# Patient Record
Sex: Male | Born: 1950 | Race: Black or African American | Hispanic: No | Smoking: Current some day smoker
Health system: Southern US, Community
[De-identification: ages and names within clinical notes are randomized; demographics above are authoritative.]

## PROBLEM LIST (undated history)

## (undated) DIAGNOSIS — C7800 Secondary malignant neoplasm of unspecified lung: Secondary | ICD-10-CM

## (undated) DIAGNOSIS — F419 Anxiety disorder, unspecified: Secondary | ICD-10-CM

## (undated) DIAGNOSIS — E119 Type 2 diabetes mellitus without complications: Secondary | ICD-10-CM

## (undated) DIAGNOSIS — T7840XA Allergy, unspecified, initial encounter: Secondary | ICD-10-CM

## (undated) DIAGNOSIS — I10 Essential (primary) hypertension: Secondary | ICD-10-CM

## (undated) HISTORY — PX: OTHER SURGICAL HISTORY: SHX169

## (undated) HISTORY — DX: Essential (primary) hypertension: I10

## (undated) HISTORY — DX: Type 2 diabetes mellitus without complications: E11.9

## (undated) HISTORY — DX: Allergy, unspecified, initial encounter: T78.40XA

## (undated) HISTORY — DX: Anxiety disorder, unspecified: F41.9

---

## 1998-04-03 ENCOUNTER — Encounter: Admission: RE | Admit: 1998-04-03 | Discharge: 1998-07-02 | Payer: Self-pay | Admitting: Internal Medicine

## 1998-07-19 ENCOUNTER — Ambulatory Visit (HOSPITAL_COMMUNITY): Admission: RE | Admit: 1998-07-19 | Discharge: 1998-07-19 | Payer: Self-pay | Admitting: Internal Medicine

## 2004-04-21 ENCOUNTER — Emergency Department (HOSPITAL_COMMUNITY): Admission: EM | Admit: 2004-04-21 | Discharge: 2004-04-21 | Payer: Self-pay | Admitting: Emergency Medicine

## 2004-04-29 ENCOUNTER — Emergency Department (HOSPITAL_COMMUNITY): Admission: EM | Admit: 2004-04-29 | Discharge: 2004-04-29 | Payer: Self-pay | Admitting: Emergency Medicine

## 2006-08-01 ENCOUNTER — Emergency Department (HOSPITAL_COMMUNITY): Admission: EM | Admit: 2006-08-01 | Discharge: 2006-08-01 | Payer: Self-pay | Admitting: Emergency Medicine

## 2007-07-06 ENCOUNTER — Ambulatory Visit: Payer: Self-pay | Admitting: Family Medicine

## 2007-07-06 ENCOUNTER — Ambulatory Visit: Payer: Self-pay | Admitting: *Deleted

## 2007-09-07 ENCOUNTER — Ambulatory Visit: Payer: Self-pay | Admitting: Family Medicine

## 2007-11-29 ENCOUNTER — Ambulatory Visit: Payer: Self-pay | Admitting: Family Medicine

## 2008-03-07 ENCOUNTER — Ambulatory Visit: Payer: Self-pay | Admitting: Family Medicine

## 2010-08-14 ENCOUNTER — Emergency Department (HOSPITAL_COMMUNITY): Admission: EM | Admit: 2010-08-14 | Discharge: 2010-08-14 | Payer: Self-pay | Admitting: Family Medicine

## 2011-01-30 LAB — POCT I-STAT, CHEM 8
Calcium, Ion: 1.19 mmol/L (ref 1.12–1.32)
Creatinine, Ser: 0.6 mg/dL (ref 0.4–1.5)
Glucose, Bld: 243 mg/dL — ABNORMAL HIGH (ref 70–99)
HCT: 45 % (ref 39.0–52.0)
Potassium: 3.9 mEq/L (ref 3.5–5.1)
Sodium: 137 mEq/L (ref 135–145)

## 2012-12-18 ENCOUNTER — Ambulatory Visit: Payer: Self-pay | Admitting: Family Medicine

## 2012-12-18 VITALS — BP 184/99 | HR 84 | Temp 97.6°F | Resp 18 | Ht 72.5 in | Wt 177.6 lb

## 2012-12-18 DIAGNOSIS — Z0289 Encounter for other administrative examinations: Secondary | ICD-10-CM

## 2012-12-18 DIAGNOSIS — I10 Essential (primary) hypertension: Secondary | ICD-10-CM

## 2012-12-18 DIAGNOSIS — E119 Type 2 diabetes mellitus without complications: Secondary | ICD-10-CM

## 2012-12-18 LAB — POCT URINALYSIS DIPSTICK
Glucose, UA: >=1000
Ketones, UA: NEGATIVE
Nitrite, UA: NEGATIVE
Spec Grav, UA: 1.02
pH, UA: 5.5

## 2012-12-18 LAB — POCT CBC
Lymph, poc: 2.9 (ref 0.6–3.4)
MCH, POC: 29.5 pg (ref 27–31.2)
MPV: 7.4 fL (ref 0–99.8)
POC LYMPH PERCENT: 39.2 %L (ref 10–50)
Platelet Count, POC: 206 10*3/uL (ref 142–424)
RBC: 4.85 M/uL (ref 4.69–6.13)
RDW, POC: 15.1 %

## 2012-12-18 LAB — POCT UA - MICROSCOPIC ONLY
Bacteria, U Microscopic: NEGATIVE
Casts, Ur, LPF, POC: NEGATIVE
Crystals, Ur, HPF, POC: NEGATIVE
Epithelial cells, urine per micros: NEGATIVE
WBC, Ur, HPF, POC: NEGATIVE

## 2012-12-18 MED ORDER — GLIPIZIDE 5 MG PO TABS: 5.0000 mg | ORAL_TABLET | Freq: Two times a day (BID) | ORAL | Status: DC

## 2012-12-18 MED ORDER — METFORMIN HCL 500 MG PO TABS: 500.0000 mg | ORAL_TABLET | Freq: Two times a day (BID) | ORAL | Status: DC

## 2012-12-18 MED ORDER — LISINOPRIL-HYDROCHLOROTHIAZIDE 10-12.5 MG PO TABS: 1.0000 | ORAL_TABLET | Freq: Every day | ORAL | Status: DC

## 2012-12-18 NOTE — Progress Notes (Signed)
[address]   [place], [place]  [address]   [phone]  Subjective:    Patient ID: Ralph [name], male    DOB: 11-02-1951, 62 y.o.   MRN: [id number]  HPIThis 62 y.o. male presents for evaluation for DOT CPE self pay.  Drives for [employer]; drives all over country. Last DOT CPE two years ago at [facility] 01/06/2011 by [place].   Has corrective lens but does not wear or need to drive.  PCP: none  Anxiety: takes Lexapro as needed; takes Klonopin tid but usually takes 1-3 times per day. No SI/HI.  No behavioral health admissions; no SI.  DMII: noted in paper chart and on DOT from 2006.  Previously maintained on Metformin and Amaryl. Pt does not recall taking these medications in past but does admit to having a glucometer at home and he reports that he knows how to check blood sugar.  Weight in 2006 of 216; has lost significant weight since then; went on a liquid diet to lose weight. Multiple family members with DMII.  Denies polyuria,polydipsia, numbness in extremities.  HTN:  Noted in paper chart and on DOT from 2006; previously maintained on Lotrel.  No medications in several years; lost significant weight since 2006.  Denies HA, dizziness, focal weakness, paresthesias.  Denies CP/palp/SOB/leg swelling.   Review of Systems  Constitutional: Negative for fever, chills, diaphoresis and fatigue.  HENT: Negative for hearing loss, ear pain, sore throat and tinnitus.   Eyes: Negative for photophobia and visual disturbance.  Respiratory: Negative for apnea, cough and shortness of breath.   Cardiovascular: Negative for chest pain, palpitations and leg swelling.  Gastrointestinal: Negative for nausea, vomiting, abdominal pain, diarrhea, constipation, blood in stool, abdominal distention and rectal pain.  Genitourinary: Negative for dysuria, urgency, hematuria, discharge, penile swelling, scrotal swelling, penile pain and testicular pain.  Musculoskeletal: Negative for myalgias, back pain, joint  swelling, arthralgias and gait problem.  Skin: Negative for color change, pallor, rash and wound.  Neurological: Negative for dizziness, tremors, seizures, syncope, facial asymmetry, speech difficulty, weakness, light-headedness, numbness and headaches.  Hematological: Negative for adenopathy. Does not bruise/bleed easily.  Psychiatric/Behavioral: Negative for suicidal ideas, behavioral problems, sleep disturbance, self-injury, dysphoric mood and agitation. The patient is nervous/anxious.     Past Medical History  Diagnosis Date  . Allergy   . Anxiety     followed [facility] every six months.  . Hypertension   . Diabetes mellitus without complication     Past Surgical History  Procedure Date  . Rotator cuff surgery     Right    Prior to Admission medications   Medication Sig Start Date End Date Taking? Authorizing Provider  clonazePAM (KLONOPIN) 0.5 MG tablet Take 0.5 mg by mouth 3 (three) times daily as needed.   Yes Historical Provider, MD  escitalopram (LEXAPRO) 10 MG tablet Take 10 mg by mouth daily.   Yes Historical Provider, MD  glipiZIDE (GLUCOTROL) 5 MG tablet Take 1 tablet (5 mg total) by mouth 2 (two) times daily before a meal. 12/18/12   Ethelda Chick, MD  lisinopril-hydrochlorothiazide (PRINZIDE,ZESTORETIC) 10-12.5 MG per tablet Take 1 tablet by mouth daily. 12/18/12   Ethelda Chick, MD  metFORMIN (GLUCOPHAGE) 500 MG tablet Take 1 tablet (500 mg total) by mouth 2 (two) times daily with a meal. 12/18/12   Ethelda Chick, MD    No Known Allergies  History   Social History  . Marital Status: Divorced    Spouse Name: N/A  Number of Children: N/A  . Years of Education: N/A   Occupational History  . Not on file.   Social History Main Topics  . Smoking status: Current Every Day Smoker -- 1.0 packs/day for 15 years    Types: Cigarettes  . Smokeless tobacco: Not on file  . Alcohol Use: Not on file  . Drug Use: Not on file  . Sexually Active: Not on file    Other Topics Concern  . Not on file   Social History Narrative   Marital status: divorced; dating casually   Children: 2 grandchildren   Employment: truck driver x 15 years   Tobacco: 1 ppd   Alcohol:  Weekends   Drugs: none    Family History  Problem Relation Age of Onset  . Heart disease Mother     AMI  . Cancer Sister     breast  . Heart disease Brother     AMI  . Cancer Brother        Objective:   Physical Exam  Nursing note and vitals reviewed. Constitutional: He is oriented to person, place, and time. He appears well-developed and well-nourished. No distress.  HENT:  Head: Normocephalic and atraumatic.  Right Ear: External ear normal.  Left Ear: External ear normal.  Nose: Nose normal.  Mouth/Throat: Oropharynx is clear and moist. Dental caries present.  Eyes: Conjunctivae normal and EOM are normal. Pupils are equal, round, and reactive to light.  Neck: Normal range of motion. Neck supple. No thyromegaly present.  Cardiovascular: Normal rate, regular rhythm, normal heart sounds and intact distal pulses.  Exam reveals no gallop and no friction rub.   No murmur heard. Pulmonary/Chest: Effort normal and breath sounds normal. He has no wheezes. He has no rales.  Abdominal: Soft. Bowel sounds are normal. He exhibits no distension and no mass. There is no tenderness. There is no rebound and no guarding. Hernia confirmed negative in the right inguinal area and confirmed negative in the left inguinal area.  Genitourinary: Testes normal and penis normal. Right testis shows no mass, no swelling and no tenderness. Left testis shows no mass, no swelling and no tenderness. No penile tenderness.  Musculoskeletal:       Right shoulder: Normal.       Left shoulder: Normal.       Right knee: Normal.       Left knee: Normal.       Cervical back: Normal.       Thoracic back: Normal.       Lumbar back: Normal.  Lymphadenopathy:    He has no cervical adenopathy.       Right: No  inguinal adenopathy present.       Left: No inguinal adenopathy present.  Neurological: He is alert and oriented to person, place, and time. He has normal reflexes. No cranial nerve deficit. He exhibits normal muscle tone. Coordination normal.  Skin: Skin is warm and dry. No rash noted. He is not diaphoretic. No erythema. No pallor.  Psychiatric: He has a normal mood and affect. His behavior is normal. Judgment and thought content normal.       Results for orders placed in visit on 12/18/12  GLUCOSE, POCT (MANUAL RESULT ENTRY)      Component Value Range   POC Glucose 248 (*) 70 - 99 mg/dl  POCT GLYCOSYLATED HEMOGLOBIN (HGB A1C)      Component Value Range   Hemoglobin A1C 12.6    POCT UA - MICROSCOPIC ONLY  Component Value Range   WBC, Ur, HPF, POC neg     RBC, urine, microscopic 1-2     Bacteria, U Microscopic neg     Mucus, UA neg     Epithelial cells, urine per micros neg     Crystals, Ur, HPF, POC neg     Casts, Ur, LPF, POC neg     Yeast, UA neg    POCT URINALYSIS DIPSTICK      Component Value Range   Color, UA yellow     Clarity, UA clear     Glucose, UA >=1000     Bilirubin, UA neg     Ketones, UA neg     Spec Grav, UA 1.020     Blood, UA moderate     pH, UA 5.5     Protein, UA 100     Urobilinogen, UA 0.2     Nitrite, UA neg     Leukocytes, UA Negative       Assessment & Plan:   1. Health examination of defined subpopulation  POCT glucose (manual entry), POCT glycosylated hemoglobin (Hb A1C), POCT UA - Microscopic Only, POCT urinalysis dipstick, POCT CBC, Comprehensive metabolic panel  2. Diabetes  POCT CBC, Comprehensive metabolic panel, metFORMIN (GLUCOPHAGE) 500 MG tablet, glipiZIDE (GLUCOTROL) 5 MG tablet  3. Essential hypertension, benign  lisinopril-hydrochlorothiazide (PRINZIDE,ZESTORETIC) 10-12.5 MG per tablet     1.DOT CPE:  Three month card only provided due to HTN, DMII uncontrolled.   2. DMII: uncontrolled; rx for Metformin 500mg  bid,  Glucotrol 5mg  bid provided.  Will warrant increase of Metformin to 1000mg  bid at follow-up in one month. 3.  HTN: uncontrolled; rx for Lisinopril/HCTZ 10/12.5 provided to take once daily.  Follow-up in one month for repeat BP. 4. Anxiety:  Stable; managed by psychiatry locally.  No SI/HI.    Meds ordered this encounter  Medications  . escitalopram (LEXAPRO) 10 MG tablet    Sig: Take 10 mg by mouth daily.  . clonazePAM (KLONOPIN) 0.5 MG tablet    Sig: Take 0.5 mg by mouth 3 (three) times daily as needed.  . metFORMIN (GLUCOPHAGE) 500 MG tablet    Sig: Take 1 tablet (500 mg total) by mouth 2 (two) times daily with a meal.    Dispense:  60 tablet    Refill:  3  . lisinopril-hydrochlorothiazide (PRINZIDE,ZESTORETIC) 10-12.5 MG per tablet    Sig: Take 1 tablet by mouth daily.    Dispense:  30 tablet    Refill:  3  . glipiZIDE (GLUCOTROL) 5 MG tablet    Sig: Take 1 tablet (5 mg total) by mouth 2 (two) times daily before a meal.    Dispense:  60 tablet    Refill:  3

## 2012-12-18 NOTE — Patient Instructions (Addendum)
1. Health examination of defined subpopulation  POCT glucose (manual entry), POCT glycosylated hemoglobin (Hb A1C), POCT UA - Microscopic Only, POCT urinalysis dipstick, POCT CBC, Comprehensive metabolic panel  2. Diabetes  POCT CBC, Comprehensive metabolic panel, metFORMIN (GLUCOPHAGE) 500 MG tablet, glipiZIDE (GLUCOTROL) 5 MG tablet  3. Essential hypertension, benign  lisinopril-hydrochlorothiazide (PRINZIDE,ZESTORETIC) 10-12.5 MG per tablet     RETURN IN ONE MONTH FOR BLOOD PRESSURE AND BLOOD SUGAR RECHECK. CHECK SUGAR ONCE DAILY; KEEP METER IN TRUCK.

## 2012-12-19 ENCOUNTER — Encounter: Payer: Self-pay | Admitting: Family Medicine

## 2012-12-19 LAB — COMPREHENSIVE METABOLIC PANEL
ALT: <8 U/L (ref 0–53)
AST: 11 U/L (ref 0–37)
Albumin: 3.7 g/dL (ref 3.5–5.2)
BUN: 14 mg/dL (ref 6–23)
Calcium: 9.3 mg/dL (ref 8.4–10.5)
Chloride: 104 mEq/L (ref 96–112)
Creat: 1.27 mg/dL (ref 0.50–1.35)
Total Protein: 6.1 g/dL (ref 6.0–8.3)

## 2012-12-21 ENCOUNTER — Encounter: Payer: Self-pay | Admitting: Family Medicine

## 2013-03-20 ENCOUNTER — Ambulatory Visit: Payer: Self-pay | Admitting: Emergency Medicine

## 2013-03-20 ENCOUNTER — Ambulatory Visit: Payer: Self-pay

## 2013-03-20 VITALS — BP 130/68 | HR 77 | Temp 98.8°F | Resp 16 | Ht 74.0 in | Wt 175.0 lb

## 2013-03-20 DIAGNOSIS — I10 Essential (primary) hypertension: Secondary | ICD-10-CM | POA: Insufficient documentation

## 2013-03-20 DIAGNOSIS — M549 Dorsalgia, unspecified: Secondary | ICD-10-CM

## 2013-03-20 DIAGNOSIS — M25552 Pain in left hip: Secondary | ICD-10-CM

## 2013-03-20 DIAGNOSIS — M25559 Pain in unspecified hip: Secondary | ICD-10-CM

## 2013-03-20 DIAGNOSIS — E119 Type 2 diabetes mellitus without complications: Secondary | ICD-10-CM | POA: Insufficient documentation

## 2013-03-20 MED ORDER — MELOXICAM 7.5 MG PO TABS: 7.5000 mg | ORAL_TABLET | Freq: Every day | ORAL | Status: DC

## 2013-03-20 MED ORDER — HYDROCODONE-ACETAMINOPHEN 5-325 MG PO TABS: ORAL_TABLET | ORAL | Status: DC
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
## 2013-03-20 NOTE — Patient Instructions (Addendum)

## 2013-04-13 ENCOUNTER — Telehealth: Payer: Self-pay

## 2013-04-13 NOTE — Telephone Encounter (Signed)
Patient would like to be referred to a specialist for pain in his legs. [phone]

## 2013-04-13 NOTE — Telephone Encounter (Signed)
Called him, has he notified his W/C carrier? He states he has not but he will do this.

## 2013-07-23 ENCOUNTER — Other Ambulatory Visit: Payer: Self-pay | Admitting: Family Medicine

## 2013-09-24 ENCOUNTER — Other Ambulatory Visit: Payer: Self-pay | Admitting: Family Medicine

## 2014-09-01 DIAGNOSIS — Z0271 Encounter for disability determination: Secondary | ICD-10-CM

## 2014-09-10 ENCOUNTER — Inpatient Hospital Stay (HOSPITAL_COMMUNITY)
Admission: EM | Admit: 2014-09-10 | Discharge: 2014-09-21 | DRG: 987 | Disposition: A | Discharge status: Skilled Nursing Facility | Payer: Medicaid Other | Attending: Internal Medicine | Admitting: Internal Medicine

## 2014-09-10 ENCOUNTER — Encounter (HOSPITAL_COMMUNITY): Payer: Self-pay | Admitting: Emergency Medicine

## 2014-09-10 ENCOUNTER — Emergency Department (HOSPITAL_COMMUNITY): Payer: Medicaid Other

## 2014-09-10 DIAGNOSIS — D638 Anemia in other chronic diseases classified elsewhere: Secondary | ICD-10-CM | POA: Diagnosis present

## 2014-09-10 DIAGNOSIS — T8089XA Other complications following infusion, transfusion and therapeutic injection, initial encounter: Secondary | ICD-10-CM

## 2014-09-10 DIAGNOSIS — Z6824 Body mass index (BMI) 24.0-24.9, adult: Secondary | ICD-10-CM | POA: Diagnosis not present

## 2014-09-10 DIAGNOSIS — F329 Major depressive disorder, single episode, unspecified: Secondary | ICD-10-CM | POA: Diagnosis present

## 2014-09-10 DIAGNOSIS — N183 Chronic kidney disease, stage 3 (moderate): Secondary | ICD-10-CM | POA: Diagnosis present

## 2014-09-10 DIAGNOSIS — R627 Adult failure to thrive: Secondary | ICD-10-CM | POA: Diagnosis present

## 2014-09-10 DIAGNOSIS — I5021 Acute systolic (congestive) heart failure: Secondary | ICD-10-CM

## 2014-09-10 DIAGNOSIS — J910 Malignant pleural effusion: Secondary | ICD-10-CM | POA: Diagnosis present

## 2014-09-10 DIAGNOSIS — Z8249 Family history of ischemic heart disease and other diseases of the circulatory system: Secondary | ICD-10-CM

## 2014-09-10 DIAGNOSIS — R591 Generalized enlarged lymph nodes: Secondary | ICD-10-CM

## 2014-09-10 DIAGNOSIS — E43 Unspecified severe protein-calorie malnutrition: Secondary | ICD-10-CM | POA: Insufficient documentation

## 2014-09-10 DIAGNOSIS — Y95 Nosocomial condition: Secondary | ICD-10-CM | POA: Diagnosis not present

## 2014-09-10 DIAGNOSIS — C770 Secondary and unspecified malignant neoplasm of lymph nodes of head, face and neck: Secondary | ICD-10-CM | POA: Diagnosis present

## 2014-09-10 DIAGNOSIS — I129 Hypertensive chronic kidney disease with stage 1 through stage 4 chronic kidney disease, or unspecified chronic kidney disease: Secondary | ICD-10-CM | POA: Diagnosis present

## 2014-09-10 DIAGNOSIS — E119 Type 2 diabetes mellitus without complications: Secondary | ICD-10-CM

## 2014-09-10 DIAGNOSIS — J9692 Respiratory failure, unspecified with hypercapnia: Secondary | ICD-10-CM | POA: Diagnosis present

## 2014-09-10 DIAGNOSIS — C3490 Malignant neoplasm of unspecified part of unspecified bronchus or lung: Secondary | ICD-10-CM | POA: Diagnosis present

## 2014-09-10 DIAGNOSIS — Z9889 Other specified postprocedural states: Secondary | ICD-10-CM

## 2014-09-10 DIAGNOSIS — F17210 Nicotine dependence, cigarettes, uncomplicated: Secondary | ICD-10-CM | POA: Diagnosis present

## 2014-09-10 DIAGNOSIS — R0902 Hypoxemia: Secondary | ICD-10-CM

## 2014-09-10 DIAGNOSIS — J189 Pneumonia, unspecified organism: Secondary | ICD-10-CM | POA: Diagnosis not present

## 2014-09-10 DIAGNOSIS — N189 Chronic kidney disease, unspecified: Secondary | ICD-10-CM | POA: Insufficient documentation

## 2014-09-10 DIAGNOSIS — Z79899 Other long term (current) drug therapy: Secondary | ICD-10-CM

## 2014-09-10 DIAGNOSIS — N179 Acute kidney failure, unspecified: Secondary | ICD-10-CM | POA: Diagnosis present

## 2014-09-10 DIAGNOSIS — I472 Ventricular tachycardia: Secondary | ICD-10-CM | POA: Diagnosis present

## 2014-09-10 DIAGNOSIS — F418 Other specified anxiety disorders: Secondary | ICD-10-CM | POA: Diagnosis present

## 2014-09-10 DIAGNOSIS — T380X5A Adverse effect of glucocorticoids and synthetic analogues, initial encounter: Secondary | ICD-10-CM | POA: Diagnosis present

## 2014-09-10 DIAGNOSIS — R0600 Dyspnea, unspecified: Secondary | ICD-10-CM

## 2014-09-10 DIAGNOSIS — I10 Essential (primary) hypertension: Secondary | ICD-10-CM

## 2014-09-10 DIAGNOSIS — J9601 Acute respiratory failure with hypoxia: Secondary | ICD-10-CM | POA: Diagnosis present

## 2014-09-10 DIAGNOSIS — R0602 Shortness of breath: Secondary | ICD-10-CM

## 2014-09-10 DIAGNOSIS — J90 Pleural effusion, not elsewhere classified: Secondary | ICD-10-CM | POA: Diagnosis present

## 2014-09-10 DIAGNOSIS — I771 Stricture of artery: Secondary | ICD-10-CM | POA: Diagnosis present

## 2014-09-10 DIAGNOSIS — C7800 Secondary malignant neoplasm of unspecified lung: Secondary | ICD-10-CM

## 2014-09-10 DIAGNOSIS — D649 Anemia, unspecified: Secondary | ICD-10-CM | POA: Diagnosis present

## 2014-09-10 DIAGNOSIS — Z66 Do not resuscitate: Secondary | ICD-10-CM | POA: Diagnosis present

## 2014-09-10 DIAGNOSIS — F419 Anxiety disorder, unspecified: Secondary | ICD-10-CM | POA: Diagnosis present

## 2014-09-10 DIAGNOSIS — J9691 Respiratory failure, unspecified with hypoxia: Secondary | ICD-10-CM | POA: Diagnosis present

## 2014-09-10 DIAGNOSIS — J9611 Chronic respiratory failure with hypoxia: Secondary | ICD-10-CM

## 2014-09-10 HISTORY — DX: Secondary malignant neoplasm of unspecified lung: C78.00

## 2014-09-10 LAB — COMPREHENSIVE METABOLIC PANEL
ALBUMIN: 2.8 g/dL — AB (ref 3.5–5.2)
ALK PHOS: 103 U/L (ref 39–117)
ALT: 12 U/L (ref 0–53)
ALT: 12 U/L (ref 0–53)
AST: 22 U/L (ref 0–37)
AST: 19 U/L (ref 0–37)
Albumin: 2.8 g/dL — ABNORMAL LOW (ref 3.5–5.2)
Alkaline Phosphatase: 102 U/L (ref 39–117)
Anion gap: 13 (ref 5–15)
Anion gap: 11 (ref 5–15)
BUN: 23 mg/dL (ref 6–23)
BUN: 22 mg/dL (ref 6–23)
CALCIUM: 8.1 mg/dL — AB (ref 8.4–10.5)
CO2: 27 meq/L (ref 19–32)
CO2: 27 meq/L (ref 19–32)
CREATININE: 1.49 mg/dL — AB (ref 0.50–1.35)
Calcium: 8.1 mg/dL — ABNORMAL LOW (ref 8.4–10.5)
Chloride: 103 mEq/L (ref 96–112)
Chloride: 101 mEq/L (ref 96–112)
Creatinine, Ser: 1.44 mg/dL — ABNORMAL HIGH (ref 0.50–1.35)
GFR calc Af Amer: 56 mL/min — ABNORMAL LOW (ref >90)
GFR calc non Af Amer: 49 mL/min — ABNORMAL LOW (ref >90)
GFR calc non Af Amer: 51 mL/min — ABNORMAL LOW (ref >90)
GFR, EST AFRICAN AMERICAN: 59 mL/min — AB (ref >90)
GLUCOSE: 231 mg/dL — AB (ref 70–99)
Glucose, Bld: 248 mg/dL — ABNORMAL HIGH (ref 70–99)
Potassium: 3.8 mEq/L (ref 3.7–5.3)
Potassium: 3.6 mEq/L — ABNORMAL LOW (ref 3.7–5.3)
SODIUM: 143 meq/L (ref 137–147)
SODIUM: 139 meq/L (ref 137–147)
TOTAL PROTEIN: 6 g/dL (ref 6.0–8.3)
TOTAL PROTEIN: 5.9 g/dL — AB (ref 6.0–8.3)
Total Bilirubin: 0.3 mg/dL (ref 0.3–1.2)
Total Bilirubin: 0.3 mg/dL (ref 0.3–1.2)

## 2014-09-10 LAB — CBC WITH DIFFERENTIAL/PLATELET
BASOS ABS: 0 10*3/uL (ref 0.0–0.1)
BASOS ABS: 0 10*3/uL (ref 0.0–0.1)
BASOS PCT: 0 % (ref 0–1)
Basophils Relative: 0 % (ref 0–1)
Eosinophils Absolute: 0.2 10*3/uL (ref 0.0–0.7)
Eosinophils Absolute: 0.3 10*3/uL (ref 0.0–0.7)
Eosinophils Relative: 2 % (ref 0–5)
Eosinophils Relative: 3 % (ref 0–5)
HEMATOCRIT: 31.7 % — AB (ref 39.0–52.0)
HEMATOCRIT: 30.9 % — AB (ref 39.0–52.0)
Hemoglobin: 10.9 g/dL — ABNORMAL LOW (ref 13.0–17.0)
Hemoglobin: 10.5 g/dL — ABNORMAL LOW (ref 13.0–17.0)
LYMPHS ABS: 2 10*3/uL (ref 0.7–4.0)
Lymphocytes Relative: 20 % (ref 12–46)
Lymphocytes Relative: 23 % (ref 12–46)
Lymphs Abs: 2.1 10*3/uL (ref 0.7–4.0)
MCH: 28.3 pg (ref 26.0–34.0)
MCH: 27.9 pg (ref 26.0–34.0)
MCHC: 34.4 g/dL (ref 30.0–36.0)
MCHC: 34 g/dL (ref 30.0–36.0)
MCV: 82.3 fL (ref 78.0–100.0)
MCV: 82.2 fL (ref 78.0–100.0)
MONO ABS: 0.4 10*3/uL (ref 0.1–1.0)
MONO ABS: 0.5 10*3/uL (ref 0.1–1.0)
MONOS PCT: 4 % (ref 3–12)
Monocytes Relative: 6 % (ref 3–12)
NEUTROS ABS: 7.5 10*3/uL (ref 1.7–7.7)
NEUTROS ABS: 6.4 10*3/uL (ref 1.7–7.7)
Neutrophils Relative %: 74 % (ref 43–77)
Neutrophils Relative %: 68 % (ref 43–77)
PLATELETS: 175 10*3/uL (ref 150–400)
Platelets: 170 10*3/uL (ref 150–400)
RBC: 3.85 MIL/uL — ABNORMAL LOW (ref 4.22–5.81)
RBC: 3.76 MIL/uL — ABNORMAL LOW (ref 4.22–5.81)
RDW: 17 % — AB (ref 11.5–15.5)
RDW: 17 % — AB (ref 11.5–15.5)
WBC: 10.2 10*3/uL (ref 4.0–10.5)
WBC: 9.3 10*3/uL (ref 4.0–10.5)

## 2014-09-10 LAB — HEMOGLOBIN A1C
Hgb A1c MFr Bld: 7.4 % — ABNORMAL HIGH (ref <5.7)
Mean Plasma Glucose: 166 mg/dL — ABNORMAL HIGH (ref <117)

## 2014-09-10 LAB — MAGNESIUM: MAGNESIUM: 1.7 mg/dL (ref 1.5–2.5)

## 2014-09-10 LAB — PROTIME-INR
INR: 1.25 (ref 0.00–1.49)
Prothrombin Time: 15.9 seconds — ABNORMAL HIGH (ref 11.6–15.2)

## 2014-09-10 LAB — TSH: TSH: 2.1 u[IU]/mL (ref 0.350–4.500)

## 2014-09-10 LAB — APTT: APTT: 34 s (ref 24–37)

## 2014-09-10 LAB — GLUCOSE, CAPILLARY: GLUCOSE-CAPILLARY: 323 mg/dL — AB (ref 70–99)

## 2014-09-10 LAB — PHOSPHORUS: PHOSPHORUS: 3.6 mg/dL (ref 2.3–4.6)

## 2014-09-10 MED ORDER — GLIPIZIDE 5 MG PO TABS
5.0000 mg | ORAL_TABLET | Freq: Every day | ORAL | Status: DC
  Filled 2014-09-10 (×2): qty 1

## 2014-09-10 MED ORDER — ONDANSETRON HCL 4 MG/2ML IJ SOLN: 4.0000 mg | Freq: Three times a day (TID) | INTRAMUSCULAR | Status: DC | PRN

## 2014-09-10 MED ORDER — ESCITALOPRAM OXALATE 10 MG PO TABS
10.0000 mg | ORAL_TABLET | Freq: Every day | ORAL | Status: DC
  Administered 2014-09-11 – 2014-09-21 (×10): 10 mg via ORAL
  Filled 2014-09-10 (×11): qty 1

## 2014-09-10 MED ORDER — ACETAMINOPHEN 325 MG PO TABS
650.0000 mg | ORAL_TABLET | Freq: Four times a day (QID) | ORAL | Status: DC | PRN
  Administered 2014-09-12 – 2014-09-21 (×6): 650 mg via ORAL
  Filled 2014-09-10 (×6): qty 2

## 2014-09-10 MED ORDER — LEVALBUTEROL HCL 0.63 MG/3ML IN NEBU: 0.6300 mg | INHALATION_SOLUTION | RESPIRATORY_TRACT | Status: DC | PRN

## 2014-09-10 MED ORDER — HYDRALAZINE HCL 20 MG/ML IJ SOLN
5.0000 mg | Freq: Once | INTRAMUSCULAR | Status: AC
  Administered 2014-09-10: 5 mg via INTRAVENOUS
  Filled 2014-09-10: qty 1

## 2014-09-10 MED ORDER — IOHEXOL 350 MG/ML SOLN
100.0000 mL | Freq: Once | INTRAVENOUS | Status: AC | PRN
  Administered 2014-09-10: 100 mL via INTRAVENOUS

## 2014-09-10 MED ORDER — ACETAMINOPHEN 650 MG RE SUPP: 650.0000 mg | Freq: Four times a day (QID) | RECTAL | Status: DC | PRN

## 2014-09-10 MED ORDER — IPRATROPIUM BROMIDE 0.02 % IN SOLN: 0.5000 mg | RESPIRATORY_TRACT | Status: DC | PRN

## 2014-09-10 MED ORDER — CLONAZEPAM 0.5 MG PO TABS
0.5000 mg | ORAL_TABLET | Freq: Three times a day (TID) | ORAL | Status: DC | PRN
  Administered 2014-09-10 – 2014-09-21 (×11): 0.5 mg via ORAL
  Filled 2014-09-10 (×11): qty 1

## 2014-09-10 MED ORDER — HYDRALAZINE HCL 20 MG/ML IJ SOLN
10.0000 mg | Freq: Four times a day (QID) | INTRAMUSCULAR | Status: DC | PRN
  Administered 2014-09-11: 10 mg via INTRAVENOUS
  Filled 2014-09-10: qty 1

## 2014-09-10 MED ORDER — INSULIN ASPART 100 UNIT/ML ~~LOC~~ SOLN
4.0000 [IU] | Freq: Once | SUBCUTANEOUS | Status: AC
  Administered 2014-09-10: 4 [IU] via SUBCUTANEOUS

## 2014-09-10 MED ORDER — ONDANSETRON HCL 4 MG PO TABS: 4.0000 mg | ORAL_TABLET | Freq: Four times a day (QID) | ORAL | Status: DC | PRN

## 2014-09-10 MED ORDER — INSULIN ASPART 100 UNIT/ML ~~LOC~~ SOLN
0.0000 [IU] | Freq: Three times a day (TID) | SUBCUTANEOUS | Status: DC
  Administered 2014-09-11: 5 [IU] via SUBCUTANEOUS
  Administered 2014-09-11: 1 [IU] via SUBCUTANEOUS
  Administered 2014-09-12: 3 [IU] via SUBCUTANEOUS

## 2014-09-10 MED ORDER — ONDANSETRON HCL 4 MG/2ML IJ SOLN
4.0000 mg | Freq: Four times a day (QID) | INTRAMUSCULAR | Status: DC | PRN
  Administered 2014-09-12 – 2014-09-16 (×7): 4 mg via INTRAVENOUS
  Filled 2014-09-10 (×7): qty 2

## 2014-09-10 MED ORDER — SODIUM CHLORIDE 0.9 % IV SOLN
INTRAVENOUS | Status: DC
  Administered 2014-09-10 – 2014-09-15 (×2): via INTRAVENOUS

## 2014-09-10 NOTE — ED Notes (Signed)
Attempted to call report to floor, RN hanging lood and unable to take report at this time and will call back

## 2014-09-10 NOTE — ED Provider Notes (Signed)
CSN: [id number]     Arrival date & time 09/10/14  1028 History   First MD Initiated Contact with Patient 09/10/14 1156     Chief Complaint  Patient presents with  . Shortness of Breath     HPI Pt c/o SOB and increased burping x 2 days. Pt sts "I had a cold and it feels like it is getting worse. I just keep burping." Pt can not state anything that makes it better or worse. Pt speaking in full sentences. Denies asthma and COPD. Denies edema.Life long smoker   Past Medical History  Diagnosis Date  . Allergy   . Anxiety     followed [facility] every six months.  . Hypertension   . Diabetes mellitus without complication    Past Surgical History  Procedure Laterality Date  . Rotator cuff surgery      Right   Family History  Problem Relation Age of Onset  . Heart disease Mother     AMI  . Cancer Sister     breast  . Heart disease Brother     AMI  . Cancer Brother    History  Substance Use Topics  . Smoking status: Current Every Day Smoker -- 0.75 packs/day for 15 years    Types: Cigarettes  . Smokeless tobacco: Not on file  . Alcohol Use: No    Review of Systems    Allergies  Review of patient's allergies indicates no known allergies.  Home Medications   Prior to Admission medications   Medication Sig Start Date End Date Taking? Authorizing Provider  clonazePAM (KLONOPIN) 0.5 MG tablet Take 0.5 mg by mouth 3 (three) times daily as needed for anxiety.    Yes Historical Provider, MD  escitalopram (LEXAPRO) 10 MG tablet Take 10 mg by mouth daily.   Yes Historical Provider, MD  glipiZIDE (GLUCOTROL) 5 MG tablet Take 5 mg by mouth daily before breakfast.   Yes Historical Provider, MD  ibuprofen (ADVIL,MOTRIN) 200 MG tablet Take 600 mg by mouth every 6 (six) hours as needed for mild pain or moderate pain.   Yes Historical Provider, MD  metFORMIN (GLUCOPHAGE) 500 MG tablet Take 500 mg by mouth 2 (two) times daily with a meal.   Yes Historical Provider, MD   BP  160/97  Pulse 96  Temp(Src) 97.8 F (36.6 C) (Oral)  Resp 20  Ht 6\' 2"  (1.88 m)  Wt 180 lb (81.647 kg)  BMI 23.10 kg/m2  SpO2 100% Physical Exam Physical Exam  Nursing note and vitals reviewed. Constitutional: He is oriented to person, place, and time. He appears well-developed and well-nourished. No distress.  HENT:  Head: Normocephalic and atraumatic.  Eyes: Pupils are equal, round, and reactive to light.  Neck: Normal range of motion.  Cardiovascular: Normal rate and intact distal pulses.   Pulmonary/Chest: No respiratory distress.Decreased breath sounds RLL  Abdominal: Normal appearance. He exhibits no distension.  Musculoskeletal: Normal range of motion.  Neurological: He is alert and oriented to person, place, and time. No cranial nerve deficit.  Skin: Skin is warm and dry. No rash noted.  Psychiatric: He has a normal mood and affect. His behavior is normal.   ED Course  Procedures (including critical care time) Labs Review Labs Reviewed  CBC WITH DIFFERENTIAL - Abnormal; Notable for the following:    RBC 3.85 (*)    Hemoglobin 10.9 (*)    HCT 31.7 (*)    RDW 17.0 (*)    All other components within normal  limits  COMPREHENSIVE METABOLIC PANEL - Abnormal; Notable for the following:    Glucose, Bld 231 (*)    Creatinine, Ser 1.49 (*)    Calcium 8.1 (*)    Albumin 2.8 (*)    GFR calc non Af Amer 49 (*)    GFR calc Af Amer 56 (*)    All other components within normal limits  BODY FLUID CELL COUNT WITH DIFFERENTIAL - Abnormal; Notable for the following:    Color, Fluid YELLOW (*)    Appearance, Fluid HAZY (*)    Monocyte-Macrophage-Serous Fluid 7 (*)    All other components within normal limits  LACTATE DEHYDROGENASE, BODY FLUID - Abnormal; Notable for the following:    LD, Fluid 187 (*)    All other components within normal limits  COMPREHENSIVE METABOLIC PANEL - Abnormal; Notable for the following:    Potassium 3.6 (*)    Glucose, Bld 248 (*)    Creatinine,  Ser 1.44 (*)    Calcium 8.1 (*)    Total Protein 5.9 (*)    Albumin 2.8 (*)    GFR calc non Af Amer 51 (*)    GFR calc Af Amer 59 (*)    All other components within normal limits  CBC WITH DIFFERENTIAL - Abnormal; Notable for the following:    RBC 3.76 (*)    Hemoglobin 10.5 (*)    HCT 30.9 (*)    RDW 17.0 (*)    All other components within normal limits  PROTIME-INR - Abnormal; Notable for the following:    Prothrombin Time 15.9 (*)    All other components within normal limits  HEMOGLOBIN A1C - Abnormal; Notable for the following:    Hemoglobin A1C 7.4 (*)    Mean Plasma Glucose 166 (*)    All other components within normal limits  CBC - Abnormal; Notable for the following:    RBC 3.45 (*)    Hemoglobin 10.0 (*)    HCT 28.8 (*)    RDW 17.2 (*)    All other components within normal limits  COMPREHENSIVE METABOLIC PANEL - Abnormal; Notable for the following:    Potassium 3.3 (*)    Glucose, Bld 214 (*)    Creatinine, Ser 1.44 (*)    Calcium 7.9 (*)    Total Protein 5.5 (*)    Albumin 2.6 (*)    GFR calc non Af Amer 51 (*)    GFR calc Af Amer 59 (*)    All other components within normal limits  GLUCOSE, CAPILLARY - Abnormal; Notable for the following:    Glucose-Capillary 323 (*)    All other components within normal limits  LACTATE DEHYDROGENASE - Abnormal; Notable for the following:    LDH 449 (*)    All other components within normal limits  GLUCOSE, CAPILLARY - Abnormal; Notable for the following:    Glucose-Capillary 258 (*)    All other components within normal limits  FERRITIN - Abnormal; Notable for the following:    Ferritin 492 (*)    All other components within normal limits  TRANSFERRIN - Abnormal; Notable for the following:    Transferrin 202 (*)    All other components within normal limits  GLUCOSE, CAPILLARY - Abnormal; Notable for the following:    Glucose-Capillary 146 (*)    All other components within normal limits  BODY FLUID CULTURE  ALBUMIN,  FLUID  PROTEIN, BODY FLUID  MAGNESIUM  PHOSPHORUS  APTT  TSH  HIV ANTIBODY (ROUTINE TESTING)  URIC  ACID  HEPATITIS B SURFACE ANTIBODY  HEPATITIS B SURFACE ANTIGEN  HEPATITIS B CORE ANTIBODY, IGM  HEPATITIS C ANTIBODY  SEDIMENTATION RATE  IRON AND TIBC  VITAMIN B12  TSH  OCCULT BLOOD X 1 CARD TO LAB, STOOL  GLUCOSE, CAPILLARY  QUANTIFERON TB GOLD ASSAY  BETA 2 MICROGLOBULIN, SERUM  FOLATE RBC  URINALYSIS, ROUTINE W REFLEX MICROSCOPIC  BASIC METABOLIC PANEL  CBC  TROPONIN I  TROPONIN I  TROPONIN I  CYTOLOGY - NON PAP    Imaging Review Dg Chest 1 View  09/11/2014   CLINICAL DATA:  Post right thoracentesis  EXAM: CHEST - 1 VIEW  COMPARISON:  09/10/2014  FINDINGS: Decreased right pleural effusion following thoracentesis. Small right effusion remains. No pneumothorax. Right lower lobe atelectasis has improved  Small left effusion and left lower lobe atelectasis unchanged. Negative for heart failure. Right peritracheal adenopathy noted  IMPRESSION: Decreased right pleural effusion following thoracentesis. No pneumothorax.   Electronically Signed   By: Franchot Gallo M.D.   On: 09/11/2014 11:01   Dg Chest 2 View (if Patient Has Fever And/or Copd)  09/10/2014   CLINICAL DATA:  Short of breath and burping over the last 2 days. History of hypertension and diabetes. History of smoking.  EXAM: CHEST  2 VIEW  COMPARISON:  03/14/2014.  FINDINGS: There are new moderate right and small left pleural effusions. There is additional lung base opacity likely atelectasis. Infiltrate not excluded. No evidence of pulmonary edema. No pneumothorax.  Cardiac silhouette is normal in size. No mediastinal or hilar masses.  Bony thorax is intact.  IMPRESSION: Moderate right and small left effusions, new from the prior chest radiograph. Associated right greater than left lung base opacity is likely atelectasis. Consider pneumonia in the proper clinical setting. No pulmonary edema.   Electronically Signed   By:  Lajean Manes M.D.   On: 09/10/2014 11:52   Ct Angio Chest Pe W/cm &/or Wo Cm  09/10/2014   CLINICAL DATA:  Shortness of breath, increased burping for 2 days, had a cold that feels like it is getting worse, history hypertension, diabetes mellitus, anxiety  EXAM: CT ANGIOGRAPHY CHEST WITH CONTRAST  TECHNIQUE: Multidetector CT imaging of the chest was performed using the standard protocol during bolus administration of intravenous contrast. Multiplanar CT image reconstructions and MIPs were obtained to evaluate the vascular anatomy.  CONTRAST:  154mL OMNIPAQUE IOHEXOL 350 MG/ML SOLN IV  COMPARISON:  None  FINDINGS: Scattered atherosclerotic calcifications aorta and coronary arteries.  Aorta normal caliber.  Asymmetric gynecomastia slightly greater on LEFT.  Pulmonary arteries patent.  No evidence of pulmonary embolism.  BILATERAL pleural effusions moderate LEFT and large RIGHT.  Minimal pericardial effusion.  Extensive adenopathy including:  RIGHT peritracheal node 26 mm short axis image 38  Precarinal nodes 20 mm and 15 mm image 45.  Subcarinal node 26 mm image 54.  LEFT hilar 14 mm image 56.  RIGHT supraclavicular 21 mm image 6  Retrocrural 21 mm image 90  Gastrohepatic ligament 37 mm image 103  Additional scattered upper abdominal, mediastinal and inferior cervical lymph nodes bilaterally.  Questionable focal nodule in LEFT lower lobe 19 mm image 70.  Significant compressive atelectasis of RIGHT greater than LEFT lungs.  Emphysematous changes at apices.  Narrowing of LEFT upper and LEFT lower lobe bronchi by hilar adenopathy.  No additional mass, nodule or infiltrate.  Minimal displacement of trachea to the LEFT by paratracheal adenopathy.  Slightly heterogeneous density of the thoracic vertebrae without focal lesion.  Review of the MIP images confirms the above findings.  IMPRESSION: No evidence of pulmonary embolism.  Extensive thoracic and upper abdominal adenopathy.  Questionable nodule versus atelectasis  in LEFT lower lobe.  BILATERAL pleural effusions RIGHT greater than LEFT with associated atelectasis.  Findings may represent metastatic disease or lymphoma.  Findings called to Dr. Mathis Bud on 09/10/2014 at 1345 hr.   Electronically Signed   By: Lavonia Dana M.D.   On: 09/10/2014 13:45       EKG Interpretation None      MDM   Final diagnoses:  Shortness of breath  Lymphadenopathy, generalized  Hypoxia        Dot Lanes, MD 09/11/14 2045

## 2014-09-10 NOTE — ED Notes (Signed)
Attempted to call report to floor. Charge RN unable to take report at this time and will call back

## 2014-09-10 NOTE — ED Notes (Signed)
Paged Dr Charlies Silvers to question pts bed assignment since pt is tachy and hypertensive. Dr Charlies Silvers called and sts that she will change room to tele.

## 2014-09-10 NOTE — H&P (Signed)
[facility] History and Physical  Ralph [name] VOJ:[id number] DOB: 06-29-1951 DOA: 09/10/2014  Referring physician: ER physician PCP: No PCP Per Patient   Chief Complaint: shortness of breath  HPI:  63 year old male with past medical history of diabetes (managed with PO meds), anxiety, depression who presented to [facility] ED 09/10/2014 with worsening shortness of breath for past few days prior to this admission with occasional productive cough but mostly dry. No fevers. No chills. Patient reported chest tightness only with coughing. Shortness of breath was present on rest and with exertion. He also reported fatigue and weakness and feeling "really sick". No abdominal pain, nausea, vomiting. No reports of hematemesis or hemoptysis. No diarrhea or constipation. No lightheadedness or loss of consciousness. No GU complaints.   In ED, BP was 177/95, HR 88, T amx 98.4 F and oxygen saturaiton 89% on room air but has improved to 95% with [place] oxygen support. Blood work revealed hemoglobin of 10.9, creatinine of 1.49. CT angio chest ruled out pulmonary embolism but showed extensive thoracic and upper abdominal adenopathy questionable nodule versus atelectasis in LEFT lower lobe, bialteral pleural effusions greater on the right, findings may represent metastatic disease or lymphoma.  Pt admitted for further evaluation.   Assessment & Plan    Principal Problem:   Respiratory failure with hypoxia / right (more than left pleural effusion)  Pulmonary embolism ruled out; findings on CT angio chest concerning for metastatic disease or lymphoma   Respiratory status is stable at this time. O2 saturation is 96% with 2 L [place] oxygen support.  Continue nebulizer treatment every 4 hours PRN.  Order placed for thoracentesis to be done Monday with complete fluid analysis including cytology. Active Problems:   Essential hypertension  Added hydralazine for better BP control.    Diabetes, type 2, no  complications  Last X9B in 2014 around 12 but not other A1c's available.  Obtain A1c on this admission.  Resume glipizide but hold metformin due to acute renal failure   Acute renal failure  Possibly from metformin which is on hold. Pt had contrast for CT angio chest in ED.  Hydrate and watch for worsening renal failure.   DVT prophylaxis:   SCD's bilaterally   Radiological Exams on Admission: Dg Chest 2 View (if Patient Has Fever And/or Copd) 09/10/2014  Moderate right and small left effusions, new from the prior chest radiograph. Associated right greater than left lung base opacity is likely atelectasis. Consider pneumonia in the proper clinical setting. No pulmonary edema.     Ct Angio Chest Pe W/cm &/or Wo Cm 09/10/2014   No evidence of pulmonary embolism.  Extensive thoracic and upper abdominal adenopathy.  Questionable nodule versus atelectasis in LEFT lower lobe.  BILATERAL pleural effusions RIGHT greater than LEFT with associated atelectasis.  Findings may represent metastatic disease or lymphoma.     Code Status: Full Family Communication: Plan of care discussed with the patient  Disposition Plan: Admit for further evaluation  Leisa Lenz, MD  [facility] Pager [phone]  Review of Systems:  Constitutional: Negative for fever, chills and malaise/fatigue. Negative for diaphoresis.  HENT: Negative for hearing loss, ear pain, nosebleeds, congestion, sore throat, neck pain, tinnitus and ear discharge.   Eyes: Negative for blurred vision, double vision, photophobia, pain, discharge and redness.  Respiratory: per HPI.   Cardiovascular: Negative for chest pain, palpitations, orthopnea, claudication and leg swelling.  Gastrointestinal: Negative for nausea, vomiting and abdominal pain. Negative for heartburn, constipation, blood in stool  and melena.  Genitourinary: Negative for dysuria, urgency, frequency, hematuria and flank pain.  Musculoskeletal: Negative for myalgias,  back pain, joint pain and falls.  Skin: Negative for itching and rash.  Neurological: Negative for dizziness and weakness. Negative for tingling, tremors, sensory change, speech change, focal weakness, loss of consciousness and headaches.  Endo/Heme/Allergies: Negative for environmental allergies and polydipsia. Does not bruise/bleed easily.  Psychiatric/Behavioral: Negative for suicidal ideas. The patient is not nervous/anxious.      Past Medical History  Diagnosis Date  . Allergy   . Anxiety     followed [facility]Psychiatry every six months.  . Hypertension   . Diabetes mellitus without complication    Past Surgical History  Procedure Laterality Date  . Rotator cuff surgery      Right   Social History:  reports that he has been smoking Cigarettes.  He has a 11.25 pack-year smoking history. He does not have any smokeless tobacco history on file. He reports that he does not drink alcohol or use illicit drugs.  No Known Allergies  Family History:  Family History  Problem Relation Age of Onset  . Heart disease Mother     AMI  . Cancer Sister     breast  . Heart disease Brother     AMI  . Cancer Brother      Prior to Admission medications   Medication Sig Start Date End Date Taking? Authorizing Provider  clonazePAM (KLONOPIN) 0.5 MG tablet Take 0.5 mg by mouth 3 (three) times daily as needed for anxiety.    Yes Historical Provider, MD  escitalopram (LEXAPRO) 10 MG tablet Take 10 mg by mouth daily.   Yes Historical Provider, MD  glipiZIDE (GLUCOTROL) 5 MG tablet Take 5 mg by mouth daily before breakfast.   Yes Historical Provider, MD  ibuprofen (ADVIL,MOTRIN) 200 MG tablet Take 600 mg by mouth every 6 (six) hours as needed for mild pain or moderate pain.   Yes Historical Provider, MD  metFORMIN (GLUCOPHAGE) 500 MG tablet Take 500 mg by mouth 2 (two) times daily with a meal.   Yes Historical Provider, MD   Physical Exam: Filed Vitals:   09/10/14 1500 09/10/14 1801 09/10/14 1853  09/10/14 1854  BP: 185/98  187/97 177/95  Pulse: 88   94  Temp:      TempSrc:      Resp: 19     Height:  6\' 2"  (1.88 m)    Weight:  81.647 kg (180 lb)    SpO2: 96%       Physical Exam  Constitutional: Appears stated age. No distress.  HENT: Normocephalic. No tonsillar erythema or exudates Eyes: Conjunctivae and EOM are normal. PERRLA, no scleral icterus.  Neck: Normal ROM. Neck supple. No JVD. No tracheal deviation. No thyromegaly.  CVS: RRR, S1/S2 +, no murmurs, no gallops, no carotid bruit.  Pulmonary: diminished breath sounds right more than left, no wheezing .  Abdominal: Soft. BS +,  no distension, tenderness, rebound or guarding.  Musculoskeletal: Normal range of motion. No edema and no tenderness.  Lymphadenopathy: No lymphadenopathy noted, cervical, inguinal. Neuro: Alert. Normal reflexes, muscle tone coordination. No focal neurologic deficits. Skin: Skin is warm and dry. No rash noted. Not diaphoretic. No erythema. No pallor.  Psychiatric: Normal mood and affect. Behavior, judgment, thought content normal.   Labs on Admission:  Basic Metabolic Panel:  Recent Labs Lab 09/10/14 1223 09/10/14 1803  NA 143 139  K 3.8 3.6*  CL 103 101  CO2 27  27  GLUCOSE 231* 248*  BUN 23 22  CREATININE 1.49* 1.44*  CALCIUM 8.1* 8.1*  MG  --  1.7  PHOS  --  3.6   Liver Function Tests:  Recent Labs Lab 09/10/14 1223 09/10/14 1803  AST 22 19  ALT 12 12  ALKPHOS 103 102  BILITOT 0.3 0.3  PROT 6.0 5.9*  ALBUMIN 2.8* 2.8*   No results found for this basename: LIPASE, AMYLASE,  in the last 168 hours No results found for this basename: AMMONIA,  in the last 168 hours CBC:  Recent Labs Lab 09/10/14 1223 09/10/14 1803  WBC 10.2 9.3  NEUTROABS 7.5 6.4  HGB 10.9* 10.5*  HCT 31.7* 30.9*  MCV 82.3 82.2  PLT 175 170   Cardiac Enzymes: No results found for this basename: CKTOTAL, CKMB, CKMBINDEX, TROPONINI,  in the last 168 hours BNP: No components found with this  basename: POCBNP,  CBG: No results found for this basename: GLUCAP,  in the last 168 hours  If 7PM-7AM, please contact night-coverage [email or web address] Password [facility] 09/10/2014, 7:47 PM

## 2014-09-10 NOTE — ED Notes (Signed)
Pt c/o SOB and increased burping x 2 days.  Pt sts "I had a cold and it feels like it is getting worse. I just keep burping."  Pt can not state anything that makes it better or worse.  Pt speaking in full sentences.  Denies asthma and COPD.  Denies edema.

## 2014-09-10 NOTE — ED Notes (Addendum)
Pt placed on 2 L o2, sats now 96%. Dr. Audie Pinto aware

## 2014-09-11 ENCOUNTER — Inpatient Hospital Stay (HOSPITAL_COMMUNITY): Payer: Medicaid Other

## 2014-09-11 DIAGNOSIS — I5021 Acute systolic (congestive) heart failure: Secondary | ICD-10-CM

## 2014-09-11 DIAGNOSIS — R0902 Hypoxemia: Secondary | ICD-10-CM

## 2014-09-11 DIAGNOSIS — M7989 Other specified soft tissue disorders: Secondary | ICD-10-CM

## 2014-09-11 DIAGNOSIS — I319 Disease of pericardium, unspecified: Secondary | ICD-10-CM

## 2014-09-11 DIAGNOSIS — J948 Other specified pleural conditions: Secondary | ICD-10-CM

## 2014-09-11 DIAGNOSIS — R591 Generalized enlarged lymph nodes: Secondary | ICD-10-CM

## 2014-09-11 DIAGNOSIS — E43 Unspecified severe protein-calorie malnutrition: Secondary | ICD-10-CM | POA: Insufficient documentation

## 2014-09-11 LAB — COMPREHENSIVE METABOLIC PANEL
ALBUMIN: 2.6 g/dL — AB (ref 3.5–5.2)
ALT: 12 U/L (ref 0–53)
ANION GAP: 11 (ref 5–15)
AST: 17 U/L (ref 0–37)
Alkaline Phosphatase: 98 U/L (ref 39–117)
BILIRUBIN TOTAL: 0.3 mg/dL (ref 0.3–1.2)
BUN: 23 mg/dL (ref 6–23)
CALCIUM: 7.9 mg/dL — AB (ref 8.4–10.5)
CHLORIDE: 104 meq/L (ref 96–112)
CO2: 28 mEq/L (ref 19–32)
CREATININE: 1.44 mg/dL — AB (ref 0.50–1.35)
GFR calc Af Amer: 59 mL/min — ABNORMAL LOW (ref >90)
GFR calc non Af Amer: 51 mL/min — ABNORMAL LOW (ref >90)
Glucose, Bld: 214 mg/dL — ABNORMAL HIGH (ref 70–99)
Potassium: 3.3 mEq/L — ABNORMAL LOW (ref 3.7–5.3)
Sodium: 143 mEq/L (ref 137–147)
Total Protein: 5.5 g/dL — ABNORMAL LOW (ref 6.0–8.3)

## 2014-09-11 LAB — CBC
HEMATOCRIT: 28.8 % — AB (ref 39.0–52.0)
Hemoglobin: 10 g/dL — ABNORMAL LOW (ref 13.0–17.0)
MCH: 29 pg (ref 26.0–34.0)
MCHC: 34.7 g/dL (ref 30.0–36.0)
MCV: 83.5 fL (ref 78.0–100.0)
PLATELETS: 190 10*3/uL (ref 150–400)
RBC: 3.45 MIL/uL — ABNORMAL LOW (ref 4.22–5.81)
RDW: 17.2 % — ABNORMAL HIGH (ref 11.5–15.5)
WBC: 9.6 10*3/uL (ref 4.0–10.5)

## 2014-09-11 LAB — GLUCOSE, CAPILLARY
GLUCOSE-CAPILLARY: 258 mg/dL — AB (ref 70–99)
Glucose-Capillary: 146 mg/dL — ABNORMAL HIGH (ref 70–99)
Glucose-Capillary: 83 mg/dL (ref 70–99)
Glucose-Capillary: 158 mg/dL — ABNORMAL HIGH (ref 70–99)

## 2014-09-11 LAB — BODY FLUID CELL COUNT WITH DIFFERENTIAL
Eos, Fluid: 1 %
Lymphs, Fluid: 90 %
MONOCYTE-MACROPHAGE-SEROUS FLUID: 7 % — AB (ref 50–90)
Neutrophil Count, Fluid: 2 % (ref 0–25)
WBC FLUID: 945 uL (ref 0–1000)

## 2014-09-11 LAB — HEPATITIS B CORE ANTIBODY, IGM: Hep B C IgM: NONREACTIVE

## 2014-09-11 LAB — SEDIMENTATION RATE: Sed Rate: 8 mm/hr (ref 0–16)

## 2014-09-11 LAB — IRON AND TIBC
IRON: 49 ug/dL (ref 42–135)
SATURATION RATIOS: 20 % (ref 20–55)
TIBC: 244 ug/dL (ref 215–435)
UIBC: 195 ug/dL (ref 125–400)

## 2014-09-11 LAB — TROPONIN I

## 2014-09-11 LAB — LACTATE DEHYDROGENASE, PLEURAL OR PERITONEAL FLUID: LD FL: 187 U/L — AB (ref 3–23)

## 2014-09-11 LAB — HEPATITIS B SURFACE ANTIGEN: HEP B S AG: NEGATIVE

## 2014-09-11 LAB — ALBUMIN, FLUID (OTHER): Albumin, Fluid: 1.5 g/dL

## 2014-09-11 LAB — VITAMIN B12: Vitamin B-12: 623 pg/mL (ref 211–911)

## 2014-09-11 LAB — PROTEIN, BODY FLUID: Total protein, fluid: 2.5 g/dL

## 2014-09-11 LAB — LACTATE DEHYDROGENASE: LDH: 449 U/L — ABNORMAL HIGH (ref 94–250)

## 2014-09-11 LAB — OCCULT BLOOD X 1 CARD TO LAB, STOOL: Fecal Occult Bld: NEGATIVE

## 2014-09-11 LAB — TRANSFERRIN: Transferrin: 202 mg/dL — ABNORMAL LOW (ref 200–360)

## 2014-09-11 LAB — HEPATITIS B SURFACE ANTIBODY,QUALITATIVE: Hep B S Ab: NEGATIVE

## 2014-09-11 LAB — URIC ACID: Uric Acid, Serum: 7.2 mg/dL (ref 4.0–7.8)

## 2014-09-11 LAB — HIV ANTIBODY (ROUTINE TESTING W REFLEX): HIV 1&2 Ab, 4th Generation: NONREACTIVE

## 2014-09-11 LAB — FERRITIN: FERRITIN: 492 ng/mL — AB (ref 22–322)

## 2014-09-11 LAB — TSH: TSH: 3.62 u[IU]/mL (ref 0.350–4.500)

## 2014-09-11 LAB — HEPATITIS C ANTIBODY: HCV AB: NEGATIVE

## 2014-09-11 MED ORDER — METOPROLOL TARTRATE 12.5 MG HALF TABLET
12.5000 mg | ORAL_TABLET | Freq: Two times a day (BID) | ORAL | Status: DC
  Administered 2014-09-11 (×2): 12.5 mg via ORAL
  Filled 2014-09-11 (×5): qty 1

## 2014-09-11 MED ORDER — POTASSIUM CHLORIDE CRYS ER 20 MEQ PO TBCR
40.0000 meq | EXTENDED_RELEASE_TABLET | Freq: Once | ORAL | Status: AC
  Administered 2014-09-11: 40 meq via ORAL
  Filled 2014-09-11: qty 2

## 2014-09-11 MED ORDER — LISINOPRIL 10 MG PO TABS
10.0000 mg | ORAL_TABLET | Freq: Every day | ORAL | Status: DC
  Administered 2014-09-11: 10 mg via ORAL
  Filled 2014-09-11 (×2): qty 1

## 2014-09-11 MED ORDER — INSULIN DETEMIR 100 UNIT/ML ~~LOC~~ SOLN
10.0000 [IU] | Freq: Every day | SUBCUTANEOUS | Status: DC
  Administered 2014-09-11 – 2014-09-12 (×2): 10 [IU] via SUBCUTANEOUS
  Filled 2014-09-11 (×2): qty 0.1

## 2014-09-11 MED ORDER — IOHEXOL 300 MG/ML  SOLN
100.0000 mL | Freq: Once | INTRAMUSCULAR | Status: AC | PRN
  Administered 2014-09-11: 100 mL via INTRAVENOUS

## 2014-09-11 MED ORDER — METOPROLOL TARTRATE 1 MG/ML IV SOLN
5.0000 mg | Freq: Four times a day (QID) | INTRAVENOUS | Status: DC | PRN
Start: 2014-09-11 — End: 2014-09-21
  Administered 2014-09-12: 5 mg via INTRAVENOUS
  Filled 2014-09-11 (×2): qty 5

## 2014-09-11 MED ORDER — NICOTINE 21 MG/24HR TD PT24
21.0000 mg | MEDICATED_PATCH | Freq: Every day | TRANSDERMAL | Status: DC
  Administered 2014-09-11 – 2014-09-21 (×10): 21 mg via TRANSDERMAL
  Filled 2014-09-11 (×11): qty 1

## 2014-09-11 MED ORDER — POTASSIUM CHLORIDE CRYS ER 20 MEQ PO TBCR
40.0000 meq | EXTENDED_RELEASE_TABLET | Freq: Every day | ORAL | Status: DC
  Administered 2014-09-13: 40 meq via ORAL
  Filled 2014-09-11 (×2): qty 2

## 2014-09-11 MED ORDER — IOHEXOL 300 MG/ML  SOLN
25.0000 mL | INTRAMUSCULAR | Status: AC
  Administered 2014-09-11: 25 mL via ORAL

## 2014-09-11 MED ORDER — FUROSEMIDE 40 MG PO TABS
40.0000 mg | ORAL_TABLET | Freq: Every day | ORAL | Status: DC
  Administered 2014-09-11: 40 mg via ORAL
  Filled 2014-09-11 (×3): qty 1

## 2014-09-11 MED ORDER — GLUCERNA SHAKE PO LIQD
237.0000 mL | Freq: Three times a day (TID) | ORAL | Status: DC
  Administered 2014-09-11 – 2014-09-21 (×20): 237 mL via ORAL
  Filled 2014-09-11 (×32): qty 237

## 2014-09-11 NOTE — Progress Notes (Signed)
Lamar Blinks, NP notified of patient BP 174/83 after receiving scheduled one time order of Hydralazine 5 mg IV. Patient does have PRN for hydralazine but wanted to clarify how much should be given so close to scheduled dose. Also informed her of hs CBG 323 and patient does not have hs correction ordered. Per Lamar Blinks, she will place orders for patient.

## 2014-09-11 NOTE — Procedures (Signed)
Successful US guided right thoracentesis. Yielded 1.7L of hazy, yellow fluid. Pt tolerated procedure well. No immediate complications.  Specimen was sent for labs. CXR ordered.  Ascencion Dike PA-C 09/11/2014 10:33 AM

## 2014-09-11 NOTE — Progress Notes (Signed)
  Echocardiogram 2D Echocardiogram has been performed.  Ralph [name] 09/11/2014, 4:23 PM

## 2014-09-11 NOTE — Consult Note (Signed)
Patient seen and examined.  Plan on right supraclavicular lymph node biopsy tomorrow.  Procedure and risks discussed with him.  Risks include but are not limited to bleeding, infection, anesthesia, wound problems, nerve injury.  He seems to understand and agrees with the plan.

## 2014-09-11 NOTE — Consult Note (Signed)
[facility] Surgery Consult Note  Ralph [name] 01-18-1951  [id number].    Requesting MD: Dr. Sheran Fava Chief Complaint/Reason for Consult: Diffuse lymphadenopathy  HPI:  63 y/o male with PMH DM, anxiety, depression, HTN, smoker presented to [facility] with complaints of upper abdominal pain R>L, SOB and burping since Saturday 09/09/14.  He states he thought he had a cold and "just wasn't feeling well".  It got worse so he presented to the ED with vague complaints.    In ED, BP was 177/95, HR 88, T amx 98.4 F and oxygen saturaiton 89% on room air but has improved to 95% with [place] oxygen support. Blood work revealed hemoglobin of 10.9, creatinine of 1.49. CT angio chest ruled out pulmonary embolism, but showed extensive thoracic and upper abdominal adenopathy questionable nodule versus atelectasis in LEFT lower lobe, bialteral pleural effusions greater on the right, findings may represent metastatic disease or lymphoma. Pt admitted by the triad hospitalist team.  We were consulted regarding possible lymph node biopsy.  The patient denies knowing about his enlarged lymph nodes, denies fever/chills, N/V/D, abdominal pain, trouble urinating or having BM's.  Denies fatigue, malaise, dizziness's, night sweats, palpitations, anorexia, jaundice, constipation, .  Notes minimal weight loss which he measures in his waist size which used to be a 36 and is now a 34.  Denies pain at the sites of the LAD, no redness to his skin, no rashes.  He has smoked 1ppd for many years, but recently cut back to 3/4ppd.  Stopped drinking excessively 17 months ago.  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   No radiating pain, no precipitating/alleviating factors.     ROS: All systems reviewed and otherwise negative except for as above  Family History  Problem Relation Age of Onset  . Heart disease Mother     AMI  . Cancer Sister     breast  . Heart disease Brother     AMI  . Cancer  Brother     Past Medical History  Diagnosis Date  . Allergy   . Anxiety     followed [facility]/Psychiatry every six months.  . Hypertension   . Diabetes mellitus without complication     Past Surgical History  Procedure Laterality Date  . Rotator cuff surgery      Right    Social History:  reports that he has been smoking Cigarettes.  He has a 11.25 pack-year smoking history. He does not have any smokeless tobacco history on file. He reports that he does not drink alcohol or use illicit drugs.  Allergies: No Known Allergies  Medications Prior to Admission  Medication Sig Dispense Refill  . clonazePAM (KLONOPIN) 0.5 MG tablet Take 0.5 mg by mouth 3 (three) times daily as needed for anxiety.       [name] escitalopram (LEXAPRO) 10 MG tablet Take 10 mg by mouth daily.      [name] glipiZIDE (GLUCOTROL) 5 MG tablet Take 5 mg by mouth daily before breakfast.      . ibuprofen (ADVIL,MOTRIN) 200 MG tablet Take 600 mg by mouth every 6 (six) hours as needed for mild pain or moderate pain.      . metFORMIN (GLUCOPHAGE) 500 MG tablet Take 500 mg by mouth 2 (two) times daily with a meal.        Blood pressure 160/97, pulse 96, temperature 97.8 F (36.6 C), temperature source Oral, resp. rate 20, height 6'  2" (1.88 m), weight 180 lb (81.647 kg), SpO2 100.00%. Physical Exam: General: pleasant, WD/WN AA male who is laying in bed in NAD HEENT: head is normocephalic, atraumatic.  Sclera are noninjected.  PERRL.  Ears and nose without any masses or lesions.  Mouth is pink and moist Heart: regular, rate, and rhythm.  No obvious murmurs, gallops, or rubs noted.  Palpable pedal pulses bilaterally Lungs: CTAB, no wheezes, rhonchi, or rales noted.  Respiratory effort nonlabored Lymph:  LAD present in anterior cervical, supraclavicular, axilla, and inguinal.  Best chance would be anterior cervical on left or supraclavicular on right. Abd: soft, NT/ND, +BS, no masses, hernias, or organomegaly MS: all 4 extremities  are symmetrical with no cyanosis, clubbing, or edema. Skin: warm and dry with no masses, lesions, or rashes Psych: A&Ox3 with an appropriate affect.   Results for orders placed during the hospital encounter of 09/10/14 (from the past 48 hour(s))  CBC WITH DIFFERENTIAL     Status: Abnormal   Collection Time    09/10/14 12:23 PM      Result Value Ref Range   WBC 10.2  4.0 - 10.5 K/uL   RBC 3.85 (*) 4.22 - 5.81 MIL/uL   Hemoglobin 10.9 (*) 13.0 - 17.0 g/dL   HCT 31.7 (*) 39.0 - 52.0 %   MCV 82.3  78.0 - 100.0 fL   MCH 28.3  26.0 - 34.0 pg   MCHC 34.4  30.0 - 36.0 g/dL   RDW 17.0 (*) 11.5 - 15.5 %   Platelets 175  150 - 400 K/uL   Neutrophils Relative % 74  43 - 77 %   Neutro Abs 7.5  1.7 - 7.7 K/uL   Lymphocytes Relative 20  12 - 46 %   Lymphs Abs 2.0  0.7 - 4.0 K/uL   Monocytes Relative 4  3 - 12 %   Monocytes Absolute 0.4  0.1 - 1.0 K/uL   Eosinophils Relative 2  0 - 5 %   Eosinophils Absolute 0.2  0.0 - 0.7 K/uL   Basophils Relative 0  0 - 1 %   Basophils Absolute 0.0  0.0 - 0.1 K/uL  COMPREHENSIVE METABOLIC PANEL     Status: Abnormal   Collection Time    09/10/14 12:23 PM      Result Value Ref Range   Sodium 143  137 - 147 mEq/L   Potassium 3.8  3.7 - 5.3 mEq/L   Chloride 103  96 - 112 mEq/L   CO2 27  19 - 32 mEq/L   Glucose, Bld 231 (*) 70 - 99 mg/dL   BUN 23  6 - 23 mg/dL   Creatinine, Ser 1.49 (*) 0.50 - 1.35 mg/dL   Calcium 8.1 (*) 8.4 - 10.5 mg/dL   Total Protein 6.0  6.0 - 8.3 g/dL   Albumin 2.8 (*) 3.5 - 5.2 g/dL   AST 22  0 - 37 U/L   ALT 12  0 - 53 U/L   Alkaline Phosphatase 103  39 - 117 U/L   Total Bilirubin 0.3  0.3 - 1.2 mg/dL   GFR calc non Af Amer 49 (*) >90 mL/min   GFR calc Af Amer 56 (*) >90 mL/min   Comment: (NOTE)     The eGFR has been calculated using the CKD EPI equation.     This calculation has not been validated in all clinical situations.     eGFR's persistently <90 mL/min signify possible Chronic Kidney     Disease.  Anion gap 13  5 -  15  COMPREHENSIVE METABOLIC PANEL     Status: Abnormal   Collection Time    09/10/14  6:03 PM      Result Value Ref Range   Sodium 139  137 - 147 mEq/L   Potassium 3.6 (*) 3.7 - 5.3 mEq/L   Chloride 101  96 - 112 mEq/L   CO2 27  19 - 32 mEq/L   Glucose, Bld 248 (*) 70 - 99 mg/dL   BUN 22  6 - 23 mg/dL   Creatinine, Ser 1.44 (*) 0.50 - 1.35 mg/dL   Calcium 8.1 (*) 8.4 - 10.5 mg/dL   Total Protein 5.9 (*) 6.0 - 8.3 g/dL   Albumin 2.8 (*) 3.5 - 5.2 g/dL   AST 19  0 - 37 U/L   ALT 12  0 - 53 U/L   Alkaline Phosphatase 102  39 - 117 U/L   Total Bilirubin 0.3  0.3 - 1.2 mg/dL   GFR calc non Af Amer 51 (*) >90 mL/min   GFR calc Af Amer 59 (*) >90 mL/min   Comment: (NOTE)     The eGFR has been calculated using the CKD EPI equation.     This calculation has not been validated in all clinical situations.     eGFR's persistently <90 mL/min signify possible Chronic Kidney     Disease.   Anion gap 11  5 - 15  MAGNESIUM     Status: None   Collection Time    09/10/14  6:03 PM      Result Value Ref Range   Magnesium 1.7  1.5 - 2.5 mg/dL  PHOSPHORUS     Status: None   Collection Time    09/10/14  6:03 PM      Result Value Ref Range   Phosphorus 3.6  2.3 - 4.6 mg/dL  CBC WITH DIFFERENTIAL     Status: Abnormal   Collection Time    09/10/14  6:03 PM      Result Value Ref Range   WBC 9.3  4.0 - 10.5 K/uL   RBC 3.76 (*) 4.22 - 5.81 MIL/uL   Hemoglobin 10.5 (*) 13.0 - 17.0 g/dL   HCT 30.9 (*) 39.0 - 52.0 %   MCV 82.2  78.0 - 100.0 fL   MCH 27.9  26.0 - 34.0 pg   MCHC 34.0  30.0 - 36.0 g/dL   RDW 17.0 (*) 11.5 - 15.5 %   Platelets 170  150 - 400 K/uL   Neutrophils Relative % 68  43 - 77 %   Neutro Abs 6.4  1.7 - 7.7 K/uL   Lymphocytes Relative 23  12 - 46 %   Lymphs Abs 2.1  0.7 - 4.0 K/uL   Monocytes Relative 6  3 - 12 %   Monocytes Absolute 0.5  0.1 - 1.0 K/uL   Eosinophils Relative 3  0 - 5 %   Eosinophils Absolute 0.3  0.0 - 0.7 K/uL   Basophils Relative 0  0 - 1 %   Basophils  Absolute 0.0  0.0 - 0.1 K/uL  APTT     Status: None   Collection Time    09/10/14  6:03 PM      Result Value Ref Range   aPTT 34  24 - 37 seconds  PROTIME-INR     Status: Abnormal   Collection Time    09/10/14  6:03 PM      Result Value Ref Range  Prothrombin Time 15.9 (*) 11.6 - 15.2 seconds   INR 1.25  0.00 - 1.49  TSH     Status: None   Collection Time    09/10/14  6:03 PM      Result Value Ref Range   TSH 2.100  0.350 - 4.500 uIU/mL   Comment: Performed at [facility]     Status: Abnormal   Collection Time    09/10/14  6:03 PM      Result Value Ref Range   Hemoglobin A1C 7.4 (*) <5.7 %   Comment: (NOTE)                                                                               According to the ADA Clinical Practice Recommendations for 2011, when     HbA1c is used as a screening test:      >=6.5%   Diagnostic of Diabetes Mellitus               (if abnormal result is confirmed)     5.7-6.4%   Increased risk of developing Diabetes Mellitus     References:Diagnosis and Classification of Diabetes Mellitus,Diabetes     KJZP,9150,56(PVXYI 1):S62-S69 and Standards of Medical Care in             Diabetes - 2011,Diabetes AXKP,5374,82 (Suppl 1):S11-S61.   Mean Plasma Glucose 166 (*) <117 mg/dL   Comment: Performed at [place], [facility]     Status: Abnormal   Collection Time    09/10/14  9:32 PM      Result Value Ref Range   Glucose-Capillary 323 (*) 70 - 99 mg/dL   Comment 1 Documented in Chart     Comment 2 Notify RN    CBC     Status: Abnormal   Collection Time    09/11/14  4:00 AM      Result Value Ref Range   WBC 9.6  4.0 - 10.5 K/uL   RBC 3.45 (*) 4.22 - 5.81 MIL/uL   Hemoglobin 10.0 (*) 13.0 - 17.0 g/dL   HCT 28.8 (*) 39.0 - 52.0 %   MCV 83.5  78.0 - 100.0 fL   MCH 29.0  26.0 - 34.0 pg   MCHC 34.7  30.0 - 36.0 g/dL   RDW 17.2 (*) 11.5 - 15.5 %   Platelets 190  150 - 400 K/uL  COMPREHENSIVE METABOLIC PANEL      Status: Abnormal   Collection Time    09/11/14  4:00 AM      Result Value Ref Range   Sodium 143  137 - 147 mEq/L   Potassium 3.3 (*) 3.7 - 5.3 mEq/L   Chloride 104  96 - 112 mEq/L   CO2 28  19 - 32 mEq/L   Glucose, Bld 214 (*) 70 - 99 mg/dL   BUN 23  6 - 23 mg/dL   Creatinine, Ser 1.44 (*) 0.50 - 1.35 mg/dL   Calcium 7.9 (*) 8.4 - 10.5 mg/dL   Total Protein 5.5 (*) 6.0 - 8.3 g/dL   Albumin 2.6 (*) 3.5 - 5.2 g/dL   AST 17  0 - 37 U/L  ALT 12  0 - 53 U/L   Alkaline Phosphatase 98  39 - 117 U/L   Total Bilirubin 0.3  0.3 - 1.2 mg/dL   GFR calc non Af Amer 51 (*) >90 mL/min   GFR calc Af Amer 59 (*) >90 mL/min   Comment: (NOTE)     The eGFR has been calculated using the CKD EPI equation.     This calculation has not been validated in all clinical situations.     eGFR's persistently <90 mL/min signify possible Chronic Kidney     Disease.   Anion gap 11  5 - 15  LACTATE DEHYDROGENASE     Status: Abnormal   Collection Time    09/11/14  4:00 AM      Result Value Ref Range   LDH 449 (*) 94 - 250 U/L  GLUCOSE, CAPILLARY     Status: Abnormal   Collection Time    09/11/14  7:37 AM      Result Value Ref Range   Glucose-Capillary 258 (*) 70 - 99 mg/dL  BODY FLUID CELL COUNT WITH DIFFERENTIAL     Status: Abnormal   Collection Time    09/11/14 10:28 AM      Result Value Ref Range   Fluid Type-FCT PLEURAL     Comment: RIGHT     CORRECTED ON 10/26 AT 1113: PREVIOUSLY REPORTED AS Body Fluid   Color, Fluid YELLOW (*) YELLOW   Appearance, Fluid HAZY (*) CLEAR   WBC, Fluid 945  0 - 1000 cu mm   Neutrophil Count, Fluid 2  0 - 25 %   Lymphs, Fluid 90     Monocyte-Macrophage-Serous Fluid 7 (*) 50 - 90 %   Eos, Fluid 1     Other Cells, Fluid CORRELATE WITH CYTOLOGY.    ALBUMIN, FLUID     Status: None   Collection Time    09/11/14 10:28 AM      Result Value Ref Range   Albumin, Fluid 1.5     Comment: NO NORMAL RANGE ESTABLISHED FOR THIS TEST     Performed at [facility]    Fluid Type-FALB PLEURAL     Comment: RIGHT     CORRECTED ON 10/26 AT 1114: PREVIOUSLY REPORTED AS Pleural Fld  LACTATE DEHYDROGENASE, BODY FLUID     Status: Abnormal   Collection Time    09/11/14 10:28 AM      Result Value Ref Range   LD, Fluid 187 (*) 3 - 23 U/L   Comment: Performed at [facility]   Fluid Type-FLDH PLEURAL     Comment: RIGHT     CORRECTED ON 10/26 AT 1114: PREVIOUSLY REPORTED AS Pleural Fld  PROTEIN, BODY FLUID     Status: None   Collection Time    09/11/14 10:28 AM      Result Value Ref Range   Total protein, fluid 2.5     Comment: NO NORMAL RANGE ESTABLISHED FOR THIS TEST     Performed at [facility]   Fluid Type-FTP PLEURAL     Comment: RIGHT     CORRECTED ON 10/26 AT 1113: PREVIOUSLY REPORTED AS Pleural Fld  GLUCOSE, CAPILLARY     Status: Abnormal   Collection Time    09/11/14 11:54 AM      Result Value Ref Range   Glucose-Capillary 146 (*) 70 - 99 mg/dL  SEDIMENTATION RATE     Status: None   Collection Time    09/11/14 12:39 PM  Result Value Ref Range   Sed Rate 8  0 - 16 mm/hr   Dg Chest 1 View  09/11/2014   CLINICAL DATA:  Post right thoracentesis  EXAM: CHEST - 1 VIEW  COMPARISON:  09/10/2014  FINDINGS: Decreased right pleural effusion following thoracentesis. Small right effusion remains. No pneumothorax. Right lower lobe atelectasis has improved  Small left effusion and left lower lobe atelectasis unchanged. Negative for heart failure. Right peritracheal adenopathy noted  IMPRESSION: Decreased right pleural effusion following thoracentesis. No pneumothorax.   Electronically Signed   By: Franchot Gallo M.D.   On: 09/11/2014 11:01   Dg Chest 2 View (if Patient Has Fever And/or Copd)  09/10/2014   CLINICAL DATA:  Short of breath and burping over the last 2 days. History of hypertension and diabetes. History of smoking.  EXAM: CHEST  2 VIEW  COMPARISON:  03/14/2014.  FINDINGS: There are new moderate right and small left pleural  effusions. There is additional lung base opacity likely atelectasis. Infiltrate not excluded. No evidence of pulmonary edema. No pneumothorax.  Cardiac silhouette is normal in size. No mediastinal or hilar masses.  Bony thorax is intact.  IMPRESSION: Moderate right and small left effusions, new from the prior chest radiograph. Associated right greater than left lung base opacity is likely atelectasis. Consider pneumonia in the proper clinical setting. No pulmonary edema.   Electronically Signed   By: Lajean Manes M.D.   On: 09/10/2014 11:52   Ct Angio Chest Pe W/cm &/or Wo Cm  09/10/2014   CLINICAL DATA:  Shortness of breath, increased burping for 2 days, had a cold that feels like it is getting worse, history hypertension, diabetes mellitus, anxiety  EXAM: CT ANGIOGRAPHY CHEST WITH CONTRAST  TECHNIQUE: Multidetector CT imaging of the chest was performed using the standard protocol during bolus administration of intravenous contrast. Multiplanar CT image reconstructions and MIPs were obtained to evaluate the vascular anatomy.  CONTRAST:  13m OMNIPAQUE IOHEXOL 350 MG/ML SOLN IV  COMPARISON:  None  FINDINGS: Scattered atherosclerotic calcifications aorta and coronary arteries.  Aorta normal caliber.  Asymmetric gynecomastia slightly greater on LEFT.  Pulmonary arteries patent.  No evidence of pulmonary embolism.  BILATERAL pleural effusions moderate LEFT and large RIGHT.  Minimal pericardial effusion.  Extensive adenopathy including:  RIGHT peritracheal node 26 mm short axis image 38  Precarinal nodes 20 mm and 15 mm image 45.  Subcarinal node 26 mm image 54.  LEFT hilar 14 mm image 56.  RIGHT supraclavicular 21 mm image 6  Retrocrural 21 mm image 90  Gastrohepatic ligament 37 mm image 103  Additional scattered upper abdominal, mediastinal and inferior cervical lymph nodes bilaterally.  Questionable focal nodule in LEFT lower lobe 19 mm image 70.  Significant compressive atelectasis of RIGHT greater than LEFT  lungs.  Emphysematous changes at apices.  Narrowing of LEFT upper and LEFT lower lobe bronchi by hilar adenopathy.  No additional mass, nodule or infiltrate.  Minimal displacement of trachea to the LEFT by paratracheal adenopathy.  Slightly heterogeneous density of the thoracic vertebrae without focal lesion.  Review of the MIP images confirms the above findings.  IMPRESSION: No evidence of pulmonary embolism.  Extensive thoracic and upper abdominal adenopathy.  Questionable nodule versus atelectasis in LEFT lower lobe.  BILATERAL pleural effusions RIGHT greater than LEFT with associated atelectasis.  Findings may represent metastatic disease or lymphoma.  Findings called to Dr. BMathis Budon 09/10/2014 at 1345 hr.   Electronically Signed   By: MLavonia Dana  M.D.   On: 09/10/2014 13:45   Ct Abdomen Pelvis W Contrast  09/11/2014   CLINICAL DATA:  Weakness, pleural effusions in lymphadenopathy  EXAM: CT ABDOMEN AND PELVIS WITH CONTRAST  TECHNIQUE: Multidetector CT imaging of the abdomen and pelvis was performed using the standard protocol following bolus administration of intravenous contrast.  CONTRAST:  1106m OMNIPAQUE IOHEXOL 300 MG/ML  SOLN  COMPARISON:  09/10/2014  FINDINGS: Lung bases again demonstrate lower lobe consolidation with bilateral pleural effusions right greater than left. The overall appearance is similar to that seen on the prior CT examination. Considerable subcarinal and hilar adenopathy is identified. A mild pericardial effusion is noted.  The liver shows no focal mass lesion. Some mild periportal edema is noted. The gallbladder show some surrounding pericholecystic fluid likely related to mild ascites. No definitive cholelithiasis is seen. The spleen is within normal limits. The adrenal glands appear within normal limits as does the pancreas.  The kidneys show a normal enhancement pattern bilaterally. No renal calculi or obstructive changes are seen. Delayed images demonstrate normal excretion of  contrast material.  Multifocal lymphadenopathy is identified throughout the abdomen. Retrocrural nodes are again noted on the right similar to that seen on recent CT. Surrounding the celiac axis in the gastrohepatic ligament, there is a large conglomeration of lymph nodes. The largest component of this is to the left of the celiac axis measuring 3.7 x 4.1 cm best seen on image number 30 of series 2. This extends into the porta hepatis. Significant periaortic, pericaval and intra-aortic caval adenopathy is noted. The largest of these nodes lies in the left periaortic space measuring 2.4 cm in short axis. Portacaval adenopathy is noted as well. This measures 2.5 cm in short axis. Mild inguinal lymphadenopathy is noted. The iliac chains appear clear aortoiliac calcifications are noted. The bony structures show degenerative change of the lumbar spine. No definitive osteolytic or osteoblastic lesions are seen.  The appendix is not well visualized although no inflammatory changes are noted. Scattered diverticular change of the colon is seen.  IMPRESSION: Bilateral pleural effusions with bilateral lower lobe consolidation.  Diffuse lymphadenopathy throughout the abdomen as described. Again these changes may represent metastatic disease or diffuse lymphoma. Tissue sampling may be helpful.  Mild ascites.   Electronically Signed   By: MInez CatalinaM.D.   On: 09/11/2014 10:18   UKoreaThoracentesis Asp Pleural Space W/img Guide  09/11/2014   CLINICAL DATA:  Shortness of breath, bilateral pleural effusions right greater than left. Request diagnostic and therapeutic thoracentesis.  EXAM: ULTRASOUND GUIDED RIGHT THORACENTESIS  COMPARISON:  None.  PROCEDURE: An ultrasound guided thoracentesis was thoroughly discussed with the patient and questions answered. The benefits, risks, alternatives and complications were also discussed. The patient understands and wishes to proceed with the procedure. Written consent was obtained.   Ultrasound was performed to localize and mark an adequate pocket of fluid in the right chest. The area was then prepped and draped in the normal sterile fashion. 1% Lidocaine was used for local anesthesia. Under ultrasound guidance, a 6 French Safe-T-Centesis catheter was introduced. Thoracentesis was performed. The catheter was removed and a dressing applied.  COMPLICATIONS: None  FINDINGS: A total of approximately 1.7 L of hazy, yellow fluid was removed. A fluid sample wassent for laboratory analysis.  IMPRESSION: Successful ultrasound guided bright thoracentesis yielding 1.7 L of pleural fluid.  Read by: KAscencion DikePA-C   Electronically Signed   By: GAletta EdouardM.D.   On: 09/11/2014 10:56  Assessment/Plan Diffuse lymphadenopathy ARF - Cr. 1.44 Transudative right pleural effusion B/L LE edema PCM HTN DM Normocytic anemia Anxiety/depression Tobacco use   Plan: 1.  Dr. Sheran Fava requesting lymph node biopsy.  Will discuss with Dr. Zella Richer about timing of this procedure and location.  Multiple nodes that should be reacable 2.  NPO after MN for possible biopsy tomorrow if cleared by medicine 3.  SCD's and not currently on DVT proph, this would need to be held prior to surgery if started    Coralie Keens, [facility] Surgery 09/11/2014, 2:30 PM Pager: [phone]

## 2014-09-11 NOTE — Progress Notes (Signed)
INITIAL NUTRITION ASSESSMENT  DOCUMENTATION CODES Per approved criteria  -Severe malnutrition in the context of chronic illness  Pt meets criteria for severe MALNUTRITION in the context of chronic illness as evidenced by severe and moderate subcutaneous fat loss and muscle loss, PO intake < 75% estimated energy intake for likely > one month.   INTERVENTION: -Encouraged balanced intake of three meals daily; promoted continued excellent PO intake of >95% -Continue to recommend Glucerna Shake po TID, each supplement provides 220 kcal and 10 grams of protein,  -RD to continue to monitor   NUTRITION DIAGNOSIS: Increased nutrient needs (protein/kcal) related to chronic disease as evidenced by likely lymphoma, weight loss, FTT.   Goal: Pt to meet >/= 90% of their estimated nutrition needs    Monitor:  Total protein/energy intake, labs, weights, education needs, glucose profile  Reason for Assessment: MST  63 y.o. male  Admitting Dx: Respiratory failure with hypoxia  ASSESSMENT: 63 year old male with past medical history of diabetes (managed with PO meds), anxiety, depression who presented to [facility] ED 09/10/2014 with worsening shortness of breath for past few days prior to this admission with occasional productive cough but mostly dry. No fevers. No chills. Patient reported chest tightness only with coughing. Shortness of breath was present on rest and with exertion. He also reported fatigue and weakness and feeling "really sick".  -Pt reported he tries to consume three meals daily; however usually only consumes two. Skips breakfast, and usually eat sandwiches for lunch and dinner. Lives alone, and does not cook often. Will drink Ensure Complete occasionally.Likely not meeting > 75% estimated nutrition needs as pt also with severe muscle wasting and fat loss. -Noted that his blood glucose is usually well controlled around 120 mg/dl -Denied weight loss; however MD noted family reported  significant loss in past one year. Pt reported he used to weight around 200 lbs, and has gradually loss 20 lbs over two years, with a usual body weight of 180 lbs.  Current weight likely skewed by +2 pitting LLE and +1 pitting RLE edemas -Encouraged pt to consume three balanced meals, provided with My[employer]ate nutrition education handouts for assistance with meal planning and blood glucose control. Diabetes Coordinator to see pt on 10/26 per note -MD noted pt with likely lymphoma and plan for excisional lymph node biopsy. Would benefit from nutrient replenishment. Glucerna supplement ordered TID by MD.  -Pt had not yet tried Glucerna, but was willing to do so as he enjoys Ensure Nutrition Focused Physical Exam:  Subcutaneous Fat:  Orbital Region:WDL Upper Arm Region: severe wasting Thoracic and Lumbar Region: n/a  Muscle:  Temple Region: moderate wasting Clavicle Bone Region: moderate  Clavicle and Acromion Bone Region: moderate Scapular Bone Region: moderate Dorsal Hand: moderate Patellar Region: moderate Anterior Thigh Region: moderate Posterior Calf Region: severe wasting  Edema: +2 pitting LLE, and +1 pitting RLE   Height: Ht Readings from Last 1 Encounters:  09/10/14 6\' 2"  (1.88 m)    Weight: Wt Readings from Last 1 Encounters:  09/10/14 180 lb (81.647 kg)    Ideal Body Weight: 190 lb  % Ideal Body Weight: 95%  Wt Readings from Last 10 Encounters:  09/10/14 180 lb (81.647 kg)  03/20/13 175 lb (79.379 kg)  12/18/12 177 lb 9.6 oz (80.559 kg)    Usual Body Weight: 175-180 lb  % Usual Body Weight: 100%  BMI:  Body mass index is 23.1 kg/(m^2).  Estimated Nutritional Needs: Kcal: 2300-2500 Protein: 100-110 gram Fluid: >/= 2300 ml  daily  Skin: WDL  Diet Order: Carb Control  EDUCATION NEEDS: -Education needs addressed   Intake/Output Summary (Last 24 hours) at 09/11/14 1428 Last data filed at 09/11/14 1110  Gross per 24 hour  Intake    660 ml  Output     450 ml  Net    210 ml    Last BM: 10/25   Labs:   Recent Labs Lab 09/10/14 1223 09/10/14 1803 09/11/14 0400  NA 143 139 143  K 3.8 3.6* 3.3*  CL 103 101 104  CO2 27 27 28   BUN 23 22 23   CREATININE 1.49* 1.44* 1.44*  CALCIUM 8.1* 8.1* 7.9*  MG  --  1.7  --   PHOS  --  3.6  --   GLUCOSE 231* 248* 214*    CBG (last 3)   Recent Labs  09/10/14 2132 09/11/14 0737 09/11/14 1154  GLUCAP 323* 258* 146*    Scheduled Meds: . escitalopram  10 mg Oral Daily  . feeding supplement (GLUCERNA SHAKE)  237 mL Oral TID BM  . insulin aspart  0-9 Units Subcutaneous TID WC  . insulin detemir  10 Units Subcutaneous Daily  . metoprolol tartrate  12.5 mg Oral BID  . nicotine  21 mg Transdermal Daily    Continuous Infusions: . sodium chloride 10 mL/hr at 09/10/14 1812    Past Medical History  Diagnosis Date  . Allergy   . Anxiety     followed [facility]/Psychiatry every six months.  . Hypertension   . Diabetes mellitus without complication     Past Surgical History  Procedure Laterality Date  . Rotator cuff surgery      Right    Atlee Abide MS RD LDN Clinical Dietitian [phone]

## 2014-09-11 NOTE — Progress Notes (Signed)
PT Cancellation Note  Patient Details Name: CARLE FENECH MRN: [id number] DOB: [birth year]   Cancelled Treatment:    Reason Eval/Treat Not Completed: Patient at procedure or test/unavailable. Multiple attempts to perform PT eval on today. Pt having several tests/procedures performed-unable to work with pt. Will check back another day. Thanks.    Weston Anna, MPT Pager: [phone]

## 2014-09-11 NOTE — Progress Notes (Addendum)
[facility] PROGRESS NOTE  [name] SWN:[id number] DOB: 07-Jul-1951 DOA: 09/10/2014 PCP: No PCP Per Patient  Assessment/Plan  Respiratory failure with hypoxia secondary to large right pleural effusion -  Thoracentesis by radiology:  Appears transudative -  F/u pleural fluid culture and cytology -  Wean oxygen as tolerated -  Continue every 4 hours nebulizer treatments  Diffuse lymphadenopathy found incidentally on CT of the chest -  Patient is up-to-date on his colon cancer and prostate screening  -  CT of the abdomen and pelvis: Diffuse adenopathy, no evidence of mass -  LDH, uric acid, hepatitis panel, HIV, beta-2 microglobulin, ESR At the request of Dr. Lona Kettle -  Oncology will consult after the results of the biopsy  -  Given the likelihood that this is lymphoma, oncology is requesting that the patient go directly to excisional lymph node biopsy  -  General surgery consultation for excisional lymph node biopsy. Patient has supraclavicular lymph node and an axillary lymph node which are palpable and may be amenable to excision.   Asymmetric bilateral lower extremity edema -  Duplex lower extremities -  ECHO -  UA   Generalized weakness with progressive failure to thrive  -  PT/OT   Likely severe protein calorie malnutrition. Patient denies that he has had weight loss but his family states he has lost a lot of weight over the last year -  Liberalized diet -  Supplements -  Nutrition consultation  Hypertension, with elevated blood pressures -  DC hydralazine -  Start scheduled metoprolol twice a day with as needed when necessary metoprolol for elevated blood pressures  Nonsustained VT - Keep electrolytes within normal limits - Start beta blocker -  Echocardiogram   Diabetes mellitus, hemoglobin A1c was previously around 12, currently 7.6, possibly due to weight loss -  Diabetic diet -  Sliding-scale insulin with meals -  Trend CBGs today -  Start levemir 10  units  Acute renal failure, may actually be 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  Smokes a carton per week -  Smoking cessation counseling -  Nicotine patch  Diet:  diabetic Access:  PIV IVF:  off Proph:  SCDs pending biopsy, then lovenox  Code Status: full Family Communication: patient and niece who was at bedside Disposition Plan: pending biopsy for underlying malignancy   Consultants:  IR   General surgery  Oncology Dr. Lona Kettle by phone  Procedures:  CXR  CT angio chest  CT ab/p   US thoracentesis on 10/26  Antibiotics:  None   HPI/Subjective:  SOB improved after starting oxygen.  Has some cough.  Denies nausea, vomiting, diarrhea, constipation.    Objective: Filed Vitals:   09/11/14 0601 09/11/14 1010 09/11/14 1025 09/11/14 1109  BP: 160/84 184/96 172/92 160/97  Pulse: 90   96  Temp: 97.9 F (36.6 C)   97.8 F (36.6 C)  TempSrc: Oral   Oral  Resp: 18   20  Height:      Weight:      SpO2: 98%  97% 100%    Intake/Output Summary (Last 24 hours) at 09/11/14 1408 Last data filed at 09/11/14 1110  Gross per 24 hour  Intake    660 ml  Output    450 ml  Net    210 ml   Filed Weights   09/10/14 1801  Weight: 81.647 kg (180 lb)    Exam:   General:  Mildly cachectic BM, No acute distress  HEENT:  NCAT, MMM  Cardiovascular:  RRR, nl S1, S2 no mrg, 2+ pulses, warm extremities  Respiratory:  Diminished BS on the right base to the mid-back, otherwise CTAB, no increased WOB  Abdomen:   NABS, soft, NT/ND  MSK:   Normal tone and bulk, 2+ pitting LLE and 1+ pitting RLE edema  Neuro:  Grossly intact  Data Reviewed: Basic Metabolic Panel:  Recent Labs Lab  09/10/14 1223 09/10/14 1803 09/11/14 0400  NA 143 139 143  K 3.8 3.6* 3.3*  CL 103 101 104  CO2 _0 GLUCOSE 231* 248* 214*  BUN _1 CREATININE 1.49* 1.44* 1.44*  CALCIUM 8.1* 8.1* 7.9*  MG  --  1.7  --   PHOS  --  3.6  --    Liver Function Tests:  Recent Labs Lab 09/10/14 1223 09/10/14 1803 09/11/14 0400  AST _2 ALT _3 ALKPHOS 103 102 98  BILITOT 0.3 0.3 0.3  PROT 6.0 5.9* 5.5*  ALBUMIN 2.8* 2.8* 2.6*   No results found for this basename: LIPASE, AMYLASE,  in the last 168 hours No results found for this basename: AMMONIA,  in the last 168 hours CBC:  Recent Labs Lab 09/10/14 1223 09/10/14 1803 09/11/14 0400  WBC 10.2 9.3 9.6  NEUTROABS 7.5 6.4  --   HGB 10.9* 10.5* 10.0*  HCT 31.7* 30.9* 28.8*  MCV 82.3 82.2 83.5  PLT 175 170 190   Cardiac Enzymes: No results found for this basename: CKTOTAL, CKMB, CKMBINDEX, TROPONINI,  in the last 168 hours BNP (last 3 results) No results found for this basename: PROBNP,  in the last 8760 hours CBG:  Recent Labs Lab 09/10/14 2132 09/11/14 0737 09/11/14 1154  GLUCAP 323* 258* 146*    No results found for this or any previous visit (from the past 240 hour(s)).   Studies: Dg Chest 1 View  09/11/2014   CLINICAL DATA:  Post right thoracentesis  EXAM: CHEST - 1 VIEW  COMPARISON:  09/10/2014  FINDINGS: Decreased right pleural effusion following thoracentesis. Small right effusion remains. No pneumothorax. Right lower lobe atelectasis has improved  Small left effusion and left lower lobe atelectasis unchanged. Negative for heart failure. Right peritracheal adenopathy noted  IMPRESSION: Decreased right pleural effusion following thoracentesis. No pneumothorax.   Electronically Signed   By: Franchot Gallo M.D.   On: 09/11/2014 11:01   Dg Chest 2 View (if Patient Has Fever And/or Copd)  09/10/2014   CLINICAL DATA:  Anwar Sakata of breath and burping over the last 2 days. History of hypertension and  diabetes. History of smoking.  EXAM: CHEST  2 VIEW  COMPARISON:  03/14/2014.  FINDINGS: There are new moderate right and small left pleural effusions. There is additional lung base opacity likely atelectasis. Infiltrate not excluded. No evidence of pulmonary edema. No pneumothorax.  Cardiac silhouette is normal in size. No mediastinal or hilar masses.  Bony thorax is intact.  IMPRESSION: Moderate right and small left effusions, new from the prior chest radiograph. Associated right greater than left lung base opacity is likely atelectasis. Consider pneumonia in the proper clinical setting. No pulmonary edema.   Electronically Signed   By: Dedra Skeens.D.  On: 09/10/2014 11:52   Ct Angio Chest Pe W/cm &/or Wo Cm  09/10/2014   CLINICAL DATA:  Shortness of breath, increased burping for 2 days, had a cold that feels like it is getting worse, history hypertension, diabetes mellitus, anxiety  EXAM: CT ANGIOGRAPHY CHEST WITH CONTRAST  TECHNIQUE: Multidetector CT imaging of the chest was performed using the standard protocol during bolus administration of intravenous contrast. Multiplanar CT image reconstructions and MIPs were obtained to evaluate the vascular anatomy.  CONTRAST:  182m OMNIPAQUE IOHEXOL 350 MG/ML SOLN IV  COMPARISON:  None  FINDINGS: Scattered atherosclerotic calcifications aorta and coronary arteries.  Aorta normal caliber.  Asymmetric gynecomastia slightly greater on LEFT.  Pulmonary arteries patent.  No evidence of pulmonary embolism.  BILATERAL pleural effusions moderate LEFT and large RIGHT.  Minimal pericardial effusion.  Extensive adenopathy including:  RIGHT peritracheal node 26 mm Daimion Adamcik axis image 38  Precarinal nodes 20 mm and 15 mm image 45.  Subcarinal node 26 mm image 54.  LEFT hilar 14 mm image 56.  RIGHT supraclavicular 21 mm image 6  Retrocrural 21 mm image 90  Gastrohepatic ligament 37 mm image 103  Additional scattered upper abdominal, mediastinal and inferior cervical lymph nodes  bilaterally.  Questionable focal nodule in LEFT lower lobe 19 mm image 70.  Significant compressive atelectasis of RIGHT greater than LEFT lungs.  Emphysematous changes at apices.  Narrowing of LEFT upper and LEFT lower lobe bronchi by hilar adenopathy.  No additional mass, nodule or infiltrate.  Minimal displacement of trachea to the LEFT by paratracheal adenopathy.  Slightly heterogeneous density of the thoracic vertebrae without focal lesion.  Review of the MIP images confirms the above findings.  IMPRESSION: No evidence of pulmonary embolism.  Extensive thoracic and upper abdominal adenopathy.  Questionable nodule versus atelectasis in LEFT lower lobe.  BILATERAL pleural effusions RIGHT greater than LEFT with associated atelectasis.  Findings may represent metastatic disease or lymphoma.  Findings called to Dr. BMathis Budon 09/10/2014 at 1345 hr.   Electronically Signed   By: MLavonia DanaM.D.   On: 09/10/2014 13:45   Ct Abdomen Pelvis W Contrast  09/11/2014   CLINICAL DATA:  Weakness, pleural effusions in lymphadenopathy  EXAM: CT ABDOMEN AND PELVIS WITH CONTRAST  TECHNIQUE: Multidetector CT imaging of the abdomen and pelvis was performed using the standard protocol following bolus administration of intravenous contrast.  CONTRAST:  1048mOMNIPAQUE IOHEXOL 300 MG/ML  SOLN  COMPARISON:  09/10/2014  FINDINGS: Lung bases again demonstrate lower lobe consolidation with bilateral pleural effusions right greater than left. The overall appearance is similar to that seen on the prior CT examination. Considerable subcarinal and hilar adenopathy is identified. A mild pericardial effusion is noted.  The liver shows no focal mass lesion. Some mild periportal edema is noted. The gallbladder show some surrounding pericholecystic fluid likely related to mild ascites. No definitive cholelithiasis is seen. The spleen is within normal limits. The adrenal glands appear within normal limits as does the pancreas.  The kidneys show  a normal enhancement pattern bilaterally. No renal calculi or obstructive changes are seen. Delayed images demonstrate normal excretion of contrast material.  Multifocal lymphadenopathy is identified throughout the abdomen. Retrocrural nodes are again noted on the right similar to that seen on recent CT. Surrounding the celiac axis in the gastrohepatic ligament, there is a large conglomeration of lymph nodes. The largest component of this is to the left of the celiac axis measuring 3.7 x 4.1 cm best seen on image  number 30 of series 2. This extends into the porta hepatis. Significant periaortic, pericaval and intra-aortic caval adenopathy is noted. The largest of these nodes lies in the left periaortic space measuring 2.4 cm in Ansel Ferrall axis. Portacaval adenopathy is noted as well. This measures 2.5 cm in Dewel Lotter axis. Mild inguinal lymphadenopathy is noted. The iliac chains appear clear aortoiliac calcifications are noted. The bony structures show degenerative change of the lumbar spine. No definitive osteolytic or osteoblastic lesions are seen.  The appendix is not well visualized although no inflammatory changes are noted. Scattered diverticular change of the colon is seen.  IMPRESSION: Bilateral pleural effusions with bilateral lower lobe consolidation.  Diffuse lymphadenopathy throughout the abdomen as described. Again these changes may represent metastatic disease or diffuse lymphoma. Tissue sampling may be helpful.  Mild ascites.   Electronically Signed   By: Inez Catalina M.D.   On: 09/11/2014 10:18   US Thoracentesis Asp Pleural Space W/img Guide  09/11/2014   CLINICAL DATA:  Shortness of breath, bilateral pleural effusions right greater than left. Request diagnostic and therapeutic thoracentesis.  EXAM: ULTRASOUND GUIDED RIGHT THORACENTESIS  COMPARISON:  None.  PROCEDURE: An ultrasound guided thoracentesis was thoroughly discussed with the patient and questions answered. The benefits, risks, alternatives and  complications were also discussed. The patient understands and wishes to proceed with the procedure. Written consent was obtained.  Ultrasound was performed to localize and mark an adequate pocket of fluid in the right chest. The area was then prepped and draped in the normal sterile fashion. 1% Lidocaine was used for local anesthesia. Under ultrasound guidance, a 6 French Safe-T-Centesis catheter was introduced. Thoracentesis was performed. The catheter was removed and a dressing applied.  COMPLICATIONS: None  FINDINGS: A total of approximately 1.7 L of hazy, yellow fluid was removed. A fluid sample wassent for laboratory analysis.  IMPRESSION: Successful ultrasound guided bright thoracentesis yielding 1.7 L of pleural fluid.  Read by: Ascencion Dike PA-C   Electronically Signed   By: Aletta Edouard M.D.   On: 09/11/2014 10:56    Scheduled Meds: . escitalopram  10 mg Oral Daily  . feeding supplement (GLUCERNA SHAKE)  237 mL Oral TID BM  . insulin aspart  0-9 Units Subcutaneous TID WC  . insulin detemir  10 Units Subcutaneous Daily  . metoprolol tartrate  12.5 mg Oral BID   Continuous Infusions: . sodium chloride 10 mL/hr at 09/10/14 1812    Principal Problem:   Respiratory failure with hypoxia Active Problems:   Essential hypertension   Pleural effusion, right   Diabetes mellitus type 2, controlled, without complications   Anemia   Acute renal failure    Time spent: 30 min    Shayona Hibbitts, [facility] Pager [phone]. If 7PM-7AM, please contact night-coverage at [email or web address], password [facility] 09/11/2014, 2:08 PM  LOS: 1 day

## 2014-09-11 NOTE — Progress Notes (Signed)
Lamar Blinks, NP notified of patient BP 169/79 after additional dose of Hydralazine and order in chart to notify MD for SBP greater than 160. No new orders at this time. Will continue to monitor.

## 2014-09-11 NOTE — Progress Notes (Signed)
Inpatient Diabetes Program Recommendations  AACE/ADA: New Consensus Statement on Inpatient Glycemic Control (2013)  Target Ranges:  Prepandial:   less than 140 mg/dL      Peak postprandial:   less than 180 mg/dL (1-2 hours)      Critically ill patients:  140 - 180 mg/dL   Reason for Visit: Hyperglycemia  Diabetes history: DM2 Outpatient Diabetes medications: metformin 500 mg bid and glipizide 5 mg QD Current orders for Inpatient glycemic control: Novolog sensitive tidwc  Results for CECILE, GUEVARA (MRN [id number]) as of 09/11/2014 11:13  Ref. Range 09/10/2014 21:32 09/11/2014 07:37  Glucose-Capillary Latest Range: 70-99 mg/dL 323 (H) 258 (H)  Results for DARVELL, MONTEFORTE (MRN [id number]) as of 09/11/2014 11:13  Ref. Range 09/10/2014 18:03  Hemoglobin A1C Latest Range: <5.7 % 7.4 (H)   Needs insulin adjustment.   Inpatient Diabetes Program Recommendations Insulin - Basal: Consider addition of Lantus 8 units QHS Insulin - Meal Coverage: Novolog 3 units tidwc for meal coverage insulin if pt eats >50% meal HgbA1C: 7.4% - doubtful this is accurate with low H/H  Note: Will discuss glucose monitoring with pt this afternoon.   Will continue to follow. Thank you. Lorenda Peck, RD, LDN, CDE Inpatient Diabetes Coordinator [phone]

## 2014-09-11 NOTE — Progress Notes (Signed)
Received post Thoracentesis, puncture site dry and intact no s/s of bleeding noted.Patient no complaints of pain or discomfort at this time.

## 2014-09-11 NOTE — Progress Notes (Signed)
ECHO:  Mildly dilated LVF with normal wall thickness, severely reduced systolic function with EF of 20-25%, diffuse hypokinesis, and grade 1 DD, left atrium moderately dilated, small pericardial effusion.    A/P:  Systolic heart failure, possibly acute onset and certainly an acute exacerbation which would explain effusion with SOB and lower extremity edema.    -  ECG demonstrated T-wave inversion/flattening in the lateral leads and evidence of possible previous septal infarct -  Cycle troponins  -  Will need ischemia work up -  Lipid panel with AML -  A1c already obtained -  Ferritin wnl -  No globulin gap to suggest MM or amyloid -  Continue BB started this AM and titrate as tolerated -  Start lisinopril -  Given that pleural effusion was transudative, this may be heart failure related.  Start lasix also -  Has been having Tyffany Waldrop runs of V-tach on telemetry - will consult cardiology regarding ischemia work up and need for ICD now or later

## 2014-09-11 NOTE — Progress Notes (Signed)
VASCULAR LAB PRELIMINARY  PRELIMINARY  PRELIMINARY  PRELIMINARY  Bilateral lower extremity venous duplex  completed.    Preliminary report:  Bilateral:  No evidence of DVT, superficial thrombosis, or Baker's Cyst.   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  Patient does not endorse limiting claudication.  Ty Buntrock, RVT 09/11/2014, 3:49 PM

## 2014-09-12 ENCOUNTER — Inpatient Hospital Stay (HOSPITAL_COMMUNITY): Payer: Medicaid Other | Admitting: Anesthesiology

## 2014-09-12 ENCOUNTER — Encounter (HOSPITAL_COMMUNITY): Payer: Medicaid Other | Admitting: Anesthesiology

## 2014-09-12 ENCOUNTER — Encounter (HOSPITAL_COMMUNITY)
Admission: EM | Disposition: A | Discharge status: Skilled Nursing Facility | Payer: Self-pay | Source: Home / Self Care | Attending: Internal Medicine

## 2014-09-12 ENCOUNTER — Encounter (HOSPITAL_COMMUNITY): Payer: Self-pay | Admitting: Anesthesiology

## 2014-09-12 DIAGNOSIS — I5021 Acute systolic (congestive) heart failure: Secondary | ICD-10-CM

## 2014-09-12 HISTORY — PX: LYMPH NODE BIOPSY: SHX201

## 2014-09-12 LAB — GLUCOSE, CAPILLARY
GLUCOSE-CAPILLARY: 205 mg/dL — AB (ref 70–99)
GLUCOSE-CAPILLARY: 102 mg/dL — AB (ref 70–99)
Glucose-Capillary: 191 mg/dL — ABNORMAL HIGH (ref 70–99)
Glucose-Capillary: 187 mg/dL — ABNORMAL HIGH (ref 70–99)
Glucose-Capillary: 220 mg/dL — ABNORMAL HIGH (ref 70–99)

## 2014-09-12 LAB — PRO B NATRIURETIC PEPTIDE: PRO B NATRI PEPTIDE: 8587 pg/mL — AB (ref 0–125)

## 2014-09-12 LAB — CBC
HEMATOCRIT: 32.4 % — AB (ref 39.0–52.0)
Hemoglobin: 10.8 g/dL — ABNORMAL LOW (ref 13.0–17.0)
MCH: 27.6 pg (ref 26.0–34.0)
MCHC: 33.3 g/dL (ref 30.0–36.0)
MCV: 82.9 fL (ref 78.0–100.0)
Platelets: 217 10*3/uL (ref 150–400)
RBC: 3.91 MIL/uL — ABNORMAL LOW (ref 4.22–5.81)
RDW: 17.3 % — ABNORMAL HIGH (ref 11.5–15.5)
WBC: 12.3 10*3/uL — ABNORMAL HIGH (ref 4.0–10.5)

## 2014-09-12 LAB — FOLATE RBC: RBC Folate: 1160 ng/mL — ABNORMAL HIGH (ref >280)

## 2014-09-12 LAB — URINE MICROSCOPIC-ADD ON

## 2014-09-12 LAB — URINALYSIS, ROUTINE W REFLEX MICROSCOPIC
Bilirubin Urine: NEGATIVE
Glucose, UA: 100 mg/dL — AB
Ketones, ur: NEGATIVE mg/dL
LEUKOCYTES UA: NEGATIVE
Nitrite: NEGATIVE
PH: 5.5 (ref 5.0–8.0)
Specific Gravity, Urine: 1.044 — ABNORMAL HIGH (ref 1.005–1.030)
Urobilinogen, UA: 0.2 mg/dL (ref 0.0–1.0)

## 2014-09-12 LAB — QUANTIFERON TB GOLD ASSAY (BLOOD)
INTERFERON GAMMA RELEASE ASSAY: NEGATIVE
Mitogen value: >10 IU/mL
Quantiferon Nil Value: 0.08 IU/mL
TB Ag value: 0.07 IU/mL
TB Antigen Minus Nil Value: 0 IU/mL

## 2014-09-12 LAB — BASIC METABOLIC PANEL
Anion gap: 11 (ref 5–15)
BUN: 27 mg/dL — ABNORMAL HIGH (ref 6–23)
CALCIUM: 8.2 mg/dL — AB (ref 8.4–10.5)
CHLORIDE: 104 meq/L (ref 96–112)
CO2: 27 meq/L (ref 19–32)
Creatinine, Ser: 1.9 mg/dL — ABNORMAL HIGH (ref 0.50–1.35)
GFR calc Af Amer: 42 mL/min — ABNORMAL LOW (ref >90)
GFR calc non Af Amer: 36 mL/min — ABNORMAL LOW (ref >90)
Glucose, Bld: 173 mg/dL — ABNORMAL HIGH (ref 70–99)
Potassium: 3.7 mEq/L (ref 3.7–5.3)
SODIUM: 142 meq/L (ref 137–147)

## 2014-09-12 LAB — TROPONIN I: Troponin I: <0.3 ng/mL (ref <0.30)

## 2014-09-12 LAB — SURGICAL PCR SCREEN
MRSA, PCR: NEGATIVE
Staphylococcus aureus: POSITIVE — AB

## 2014-09-12 SURGERY — LYMPH NODE BIOPSY
Anesthesia: General | Laterality: Right

## 2014-09-12 MED ORDER — LACTATED RINGERS IV SOLN
INTRAVENOUS | Status: DC
  Administered 2014-09-12: 1000 mL via INTRAVENOUS

## 2014-09-12 MED ORDER — FENTANYL CITRATE 0.05 MG/ML IJ SOLN
INTRAMUSCULAR | Status: AC
  Filled 2014-09-12: qty 2

## 2014-09-12 MED ORDER — PROPOFOL 10 MG/ML IV BOLUS
INTRAVENOUS | Status: DC | PRN
  Administered 2014-09-12 (×2): 20 mg via INTRAVENOUS

## 2014-09-12 MED ORDER — ONDANSETRON HCL 4 MG/2ML IJ SOLN
INTRAMUSCULAR | Status: DC | PRN
  Administered 2014-09-12: 4 mg via INTRAVENOUS

## 2014-09-12 MED ORDER — 0.9 % SODIUM CHLORIDE (POUR BTL) OPTIME
TOPICAL | Status: DC | PRN
  Administered 2014-09-12: 1000 mL

## 2014-09-12 MED ORDER — PROPOFOL INFUSION 10 MG/ML OPTIME
INTRAVENOUS | Status: DC | PRN
  Administered 2014-09-12: 75 ug/kg/min via INTRAVENOUS

## 2014-09-12 MED ORDER — LIDOCAINE HCL 1 % IJ SOLN
INTRAMUSCULAR | Status: DC | PRN
  Administered 2014-09-12: 7.5 mL

## 2014-09-12 MED ORDER — FENTANYL CITRATE 0.05 MG/ML IJ SOLN
INTRAMUSCULAR | Status: DC | PRN
  Administered 2014-09-12 (×2): 25 ug via INTRAVENOUS
  Administered 2014-09-12: 50 ug via INTRAVENOUS

## 2014-09-12 MED ORDER — INSULIN DETEMIR 100 UNIT/ML ~~LOC~~ SOLN
15.0000 [IU] | Freq: Every day | SUBCUTANEOUS | Status: DC
  Administered 2014-09-13 – 2014-09-14 (×2): 15 [IU] via SUBCUTANEOUS
  Filled 2014-09-12 (×2): qty 0.15

## 2014-09-12 MED ORDER — CEFAZOLIN SODIUM-DEXTROSE 2-3 GM-% IV SOLR
INTRAVENOUS | Status: AC
  Filled 2014-09-12: qty 50

## 2014-09-12 MED ORDER — FENTANYL CITRATE 0.05 MG/ML IJ SOLN: 25.0000 ug | INTRAMUSCULAR | Status: DC | PRN

## 2014-09-12 MED ORDER — ENOXAPARIN SODIUM 40 MG/0.4ML ~~LOC~~ SOLN
40.0000 mg | SUBCUTANEOUS | Status: DC
  Administered 2014-09-13: 40 mg via SUBCUTANEOUS
  Filled 2014-09-12 (×3): qty 0.4

## 2014-09-12 MED ORDER — ISOSORB DINITRATE-HYDRALAZINE 20-37.5 MG PO TABS
1.0000 | ORAL_TABLET | Freq: Three times a day (TID) | ORAL | Status: DC
  Administered 2014-09-12 – 2014-09-21 (×26): 1 via ORAL
  Filled 2014-09-12 (×30): qty 1

## 2014-09-12 MED ORDER — HYDRALAZINE HCL 20 MG/ML IJ SOLN
10.0000 mg | Freq: Once | INTRAMUSCULAR | Status: AC
  Administered 2014-09-12: 10 mg via INTRAVENOUS
  Filled 2014-09-12: qty 1

## 2014-09-12 MED ORDER — BUPIVACAINE HCL (PF) 0.5 % IJ SOLN
INTRAMUSCULAR | Status: AC
  Filled 2014-09-12: qty 30

## 2014-09-12 MED ORDER — PROMETHAZINE HCL 25 MG/ML IJ SOLN: 6.2500 mg | INTRAMUSCULAR | Status: DC | PRN

## 2014-09-12 MED ORDER — MIDAZOLAM HCL 2 MG/2ML IJ SOLN
INTRAMUSCULAR | Status: AC
  Filled 2014-09-12: qty 2

## 2014-09-12 MED ORDER — FUROSEMIDE 10 MG/ML IJ SOLN
40.0000 mg | Freq: Three times a day (TID) | INTRAMUSCULAR | Status: DC
  Administered 2014-09-12 – 2014-09-13 (×3): 40 mg via INTRAVENOUS
  Filled 2014-09-12 (×6): qty 4

## 2014-09-12 MED ORDER — ONDANSETRON HCL 4 MG/2ML IJ SOLN
INTRAMUSCULAR | Status: AC
  Filled 2014-09-12: qty 2

## 2014-09-12 MED ORDER — INSULIN ASPART 100 UNIT/ML ~~LOC~~ SOLN: 0.0000 [IU] | Freq: Every day | SUBCUTANEOUS | Status: DC

## 2014-09-12 MED ORDER — MIDAZOLAM HCL 5 MG/5ML IJ SOLN
INTRAMUSCULAR | Status: DC | PRN
Start: 2014-09-12 — End: 2014-09-12
  Administered 2014-09-12 (×2): 1 mg via INTRAVENOUS

## 2014-09-12 MED ORDER — LIDOCAINE HCL (CARDIAC) 20 MG/ML IV SOLN
INTRAVENOUS | Status: AC
  Filled 2014-09-12: qty 5

## 2014-09-12 MED ORDER — LIDOCAINE HCL (CARDIAC) 20 MG/ML IV SOLN
INTRAVENOUS | Status: DC | PRN
  Administered 2014-09-12: 50 mg via INTRAVENOUS

## 2014-09-12 MED ORDER — FUROSEMIDE 8 MG/ML PO SOLN: 40.0000 mg | Freq: Three times a day (TID) | ORAL | Status: DC

## 2014-09-12 MED ORDER — LIDOCAINE HCL 1 % IJ SOLN
INTRAMUSCULAR | Status: AC
  Filled 2014-09-12: qty 20

## 2014-09-12 MED ORDER — METOPROLOL TARTRATE 25 MG PO TABS
25.0000 mg | ORAL_TABLET | Freq: Two times a day (BID) | ORAL | Status: DC
  Administered 2014-09-12: 25 mg via ORAL
  Filled 2014-09-12 (×3): qty 1

## 2014-09-12 MED ORDER — BUPIVACAINE HCL (PF) 0.5 % IJ SOLN
INTRAMUSCULAR | Status: DC | PRN
  Administered 2014-09-12: 7.5 mL

## 2014-09-12 MED ORDER — PROPOFOL 10 MG/ML IV BOLUS
INTRAVENOUS | Status: AC
  Filled 2014-09-12: qty 20

## 2014-09-12 MED ORDER — INSULIN ASPART 100 UNIT/ML ~~LOC~~ SOLN
0.0000 [IU] | Freq: Three times a day (TID) | SUBCUTANEOUS | Status: DC
  Administered 2014-09-12: 5 [IU] via SUBCUTANEOUS
  Administered 2014-09-13: 2 [IU] via SUBCUTANEOUS
  Administered 2014-09-13 – 2014-09-16 (×5): 3 [IU] via SUBCUTANEOUS
  Administered 2014-09-17: 5 [IU] via SUBCUTANEOUS
  Administered 2014-09-17: 3 [IU] via SUBCUTANEOUS
  Administered 2014-09-17: 5 [IU] via SUBCUTANEOUS
  Administered 2014-09-18 (×2): 3 [IU] via SUBCUTANEOUS
  Administered 2014-09-18: 8 [IU] via SUBCUTANEOUS
  Administered 2014-09-19: 3 [IU] via SUBCUTANEOUS
  Administered 2014-09-19: 5 [IU] via SUBCUTANEOUS
  Administered 2014-09-19 – 2014-09-20 (×4): 3 [IU] via SUBCUTANEOUS
  Administered 2014-09-21: 2 [IU] via SUBCUTANEOUS
  Administered 2014-09-21: 5 [IU] via SUBCUTANEOUS

## 2014-09-12 SURGICAL SUPPLY — 35 items
BENZOIN TINCTURE PRP APPL 2/3 (GAUZE/BANDAGES/DRESSINGS) ×3 IMPLANT
BLADE HEX COATED 2.75 (ELECTRODE) ×3 IMPLANT
BLADE SURG 15 STRL LF DISP TIS (BLADE) ×2 IMPLANT
BLADE SURG 15 STRL SS (BLADE) ×4
BLADE SURG SZ10 CARB STEEL (BLADE) ×3 IMPLANT
CANISTER SUCTION 2500CC (MISCELLANEOUS) ×3 IMPLANT
DECANTER SPIKE VIAL GLASS SM (MISCELLANEOUS) ×3 IMPLANT
DRAPE LAPAROTOMY TRNSV 102X78 (DRAPE) ×3 IMPLANT
DRSG TEGADERM 2-3/8X2-3/4 SM (GAUZE/BANDAGES/DRESSINGS) ×3 IMPLANT
ELECT REM PT RETURN 9FT ADLT (ELECTROSURGICAL) ×3
ELECTRODE REM PT RTRN 9FT ADLT (ELECTROSURGICAL) ×1 IMPLANT
GAUZE SPONGE 2X2 8PLY STRL LF (GAUZE/BANDAGES/DRESSINGS) ×1 IMPLANT
GAUZE SPONGE 4X4 12PLY STRL (GAUZE/BANDAGES/DRESSINGS) ×3 IMPLANT
GAUZE SPONGE 4X4 16PLY XRAY LF (GAUZE/BANDAGES/DRESSINGS) ×3 IMPLANT
GLOVE BIOGEL PI IND STRL 7.0 (GLOVE) ×1 IMPLANT
GLOVE BIOGEL PI INDICATOR 7.0 (GLOVE) ×2
GLOVE ECLIPSE 8.0 STRL XLNG CF (GLOVE) ×3 IMPLANT
GLOVE INDICATOR 8.0 STRL GRN (GLOVE) ×6 IMPLANT
GOWN STRL REUS W/TWL LRG LVL3 (GOWN DISPOSABLE) ×3 IMPLANT
GOWN STRL REUS W/TWL XL LVL3 (GOWN DISPOSABLE) ×6 IMPLANT
HEMOSTAT SURGICEL 2X3 (HEMOSTASIS) ×3 IMPLANT
KIT BASIN OR (CUSTOM PROCEDURE TRAY) ×3 IMPLANT
MARKER SKIN DUAL TIP RULER LAB (MISCELLANEOUS) ×3 IMPLANT
NEEDLE HYPO 22GX1.5 SAFETY (NEEDLE) ×3 IMPLANT
NEEDLE HYPO 25X1 1.5 SAFETY (NEEDLE) ×3 IMPLANT
NS IRRIG 1000ML POUR BTL (IV SOLUTION) ×3 IMPLANT
PACK BASIC VI WITH GOWN DISP (CUSTOM PROCEDURE TRAY) ×3 IMPLANT
PENCIL BUTTON HOLSTER BLD 10FT (ELECTRODE) ×3 IMPLANT
SOL PREP POV-IOD 4OZ 10% (MISCELLANEOUS) ×3 IMPLANT
SPONGE GAUZE 2X2 STER 10/PKG (GAUZE/BANDAGES/DRESSINGS) ×2
SUT VIC AB 3-0 SH 18 (SUTURE) ×3 IMPLANT
SYR CONTROL 10ML LL (SYRINGE) ×3 IMPLANT
TAPE STRIPS DRAPE STRL (GAUZE/BANDAGES/DRESSINGS) ×3 IMPLANT
TOWEL OR 17X26 10 PK STRL BLUE (TOWEL DISPOSABLE) ×3 IMPLANT
YANKAUER SUCT BULB TIP 10FT TU (MISCELLANEOUS) ×3 IMPLANT

## 2014-09-12 NOTE — Progress Notes (Signed)
PHARMACY CONSULT: Lovenox for VTE prophylaxis   Wt: 81 kg Renal: SCr 1.9, CrCl ~46 ml/hr  CBC: Hgb 10.8, Platelets WNL   A/P:   Lovenox 40 mg SQ q24h.  Will start tomorrow morning at 0800.  Patient s/p right neck lymph node biopsy today.  AET 10/27 1124.  Monitor CBC and renal function, adjust as needed.  Hershal Coria, PharmD, BCPS Pager: [phone] 09/12/2014 2:53 PM

## 2014-09-12 NOTE — Anesthesia Postprocedure Evaluation (Signed)
  Anesthesia Post-op Note  Patient: Ralph [name]  Procedure(s) Performed: Procedure(s) (LRB): RIGHT NECK LYMPH NODE BIOPSY (Right)  Patient Location: PACU  Anesthesia Type: MAC  Level of Consciousness: awake and alert   Airway and Oxygen Therapy: Patient Spontanous Breathing  Post-op Pain: mild  Post-op Assessment: Post-op Vital signs reviewed, Patient's Cardiovascular Status Stable, Respiratory Function Stable, Patent Airway and No signs of Nausea or vomiting  Last Vitals:  Filed Vitals:   09/12/14 1200  BP: 166/95  Pulse: 101  Temp: 36.6 C  Resp: 18    Post-op Vital Signs: stable   Complications: No apparent anesthesia complications

## 2014-09-12 NOTE — Progress Notes (Signed)
Pt gone for lymph node biopsy.  Will check back as schedule allows.  Jinger Neighbors, [place] [phone]

## 2014-09-12 NOTE — Consult Note (Signed)
Name: Ralph [name] is a 63 y.o. male Admit date: 09/10/2014 Referring Physician:  Leisa Lenz Primary Physician:  None Primary Cardiologist:  None but prior cath by [facility] at [facility]  Reason for Consultation:  New LV failure  ASSESSMENT: 1. Acute systolic heart failure, of unknown cause. Cath at [facility] 6 months ago "normal" 2. Diffuse adenopathy 3. Hypertension 4. Diabetes mellitus, type 2 5. Acute kidney injury, related to contrast in setting of diabetes and CKD/CHF  PLAN:  1. Diuresis to relieve obvious volume overload 2. Heart failure medical therapy to include beta blocker and ACE/ARB(as tolerated by renal function). Would hold on renin angio blockade until renal function stable following diuresis 3. Obtain cath data from [place] and indeed if normal coronary arteries 6 months ago, no ischemic workup will be needed. 4. Evaluate adenopathy  5. BNP   HPI: 63 year old 1 week history of dyspnea and LE swelling Echo after CXR showed CHF revealed EF 25%. He states cath done 6 months ago at [facility] was "normal". He is still moderately short of breath at rest.  PMH:   Past Medical History  Diagnosis Date  . Allergy   . Anxiety     followed [facility] every six months.  . Hypertension   . Diabetes mellitus without complication     PSH:   Past Surgical History  Procedure Laterality Date  . Rotator cuff surgery      Right   Allergies:  Review of patient's allergies indicates no known allergies. Prior to Admit Meds:   Prescriptions prior to admission  Medication Sig Dispense Refill  . clonazePAM (KLONOPIN) 0.5 MG tablet Take 0.5 mg by mouth 3 (three) times daily as needed for anxiety.       [name] escitalopram (LEXAPRO) 10 MG tablet Take 10 mg by mouth daily.      [name] glipiZIDE (GLUCOTROL) 5 MG tablet Take 5 mg by mouth daily before breakfast.      . ibuprofen (ADVIL,MOTRIN) 200 MG tablet Take 600 mg by mouth every 6 (six)  hours as needed for mild pain or moderate pain.      . metFORMIN (GLUCOPHAGE) 500 MG tablet Take 500 mg by mouth 2 (two) times daily with a meal.       Fam HX:    Family History  Problem Relation Age of Onset  . Heart disease Mother     AMI  . Cancer Sister     breast  . Heart disease Brother     AMI  . Cancer Brother    Social HX:    History   Social History  . Marital Status: Divorced    Spouse Name: N/A    Number of Children: N/A  . Years of Education: N/A   Occupational History  . Not on file.   Social History Main Topics  . Smoking status: Current Every Day Smoker -- 0.75 packs/day for 15 years    Types: Cigarettes  . Smokeless tobacco: Not on file  . Alcohol Use: No  . Drug Use: No  . Sexual Activity: Not on file   Other Topics Concern  . Not on file   Social History Narrative   Marital status: divorced; dating casually      Children: 2 grandchildren      Employment: truck driver x 15 years      Tobacco: 1 ppd      Alcohol:  Weekends      Drugs: none  Review of Systems: No history of MI or alcohol. Long history of hypertension but "it went away and therapy stopped". Poor appetite No trouble swallowing.  Physical Exam: Blood pressure 182/84, pulse 94, temperature 97.9 F (36.6 C), temperature source Oral, resp. rate 18, height 6\' 2"  (1.88 m), weight 180 lb (81.647 kg), SpO2 98.00%. Weight change:   Appears older than stated age. Moderate dyspnea. Lying on right side because he breathes better. Neuro is normal. Moderate JVD at 45 degrees Abdomen is soft with no masses or tenderness. BS normal. Extremities are normal. Pulses intact. No edema Labs: Lab Results  Component Value Date   WBC 12.3* 09/12/2014   HGB 10.8* 09/12/2014   HCT 32.4* 09/12/2014   MCV 82.9 09/12/2014   PLT 217 09/12/2014    Recent Labs Lab 09/11/14 0400 09/12/14 0620  NA 143 142  K 3.3* 3.7  CL 104 104  CO2 28 27  BUN 23 27*  CREATININE 1.44* 1.90*  CALCIUM 7.9*  8.2*  PROT 5.5*  --   BILITOT 0.3  --   ALKPHOS 98  --   ALT 12  --   AST 17  --   GLUCOSE 214* 173*   No results found for this basename: PTT   Lab Results  Component Value Date   INR 1.25 09/10/2014   Lab Results  Component Value Date   TROPONINI <0.30 09/12/2014     BNP No results found for this basename: probnp       Radiology:  Dg Chest 1 View  09/11/2014   CLINICAL DATA:  Post right thoracentesis  EXAM: CHEST - 1 VIEW  COMPARISON:  09/10/2014  FINDINGS: Decreased right pleural effusion following thoracentesis. Small right effusion remains. No pneumothorax. Right lower lobe atelectasis has improved  Small left effusion and left lower lobe atelectasis unchanged. Negative for heart failure. Right peritracheal adenopathy noted  IMPRESSION: Decreased right pleural effusion following thoracentesis. No pneumothorax.   Electronically Signed   By: Franchot Gallo M.D.   On: 09/11/2014 11:01   Dg Chest 2 View (if Patient Has Fever And/or Copd)  09/10/2014   CLINICAL DATA:  Short of breath and burping over the last 2 days. History of hypertension and diabetes. History of smoking.  EXAM: CHEST  2 VIEW  COMPARISON:  03/14/2014.  FINDINGS: There are new moderate right and small left pleural effusions. There is additional lung base opacity likely atelectasis. Infiltrate not excluded. No evidence of pulmonary edema. No pneumothorax.  Cardiac silhouette is normal in size. No mediastinal or hilar masses.  Bony thorax is intact.  IMPRESSION: Moderate right and small left effusions, new from the prior chest radiograph. Associated right greater than left lung base opacity is likely atelectasis. Consider pneumonia in the proper clinical setting. No pulmonary edema.   Electronically Signed   By: Lajean Manes M.D.   On: 09/10/2014 11:52   Ct Angio Chest Pe W/cm &/or Wo Cm  09/10/2014   CLINICAL DATA:  Shortness of breath, increased burping for 2 days, had a cold that feels like it is getting worse,  history hypertension, diabetes mellitus, anxiety  EXAM: CT ANGIOGRAPHY CHEST WITH CONTRAST  TECHNIQUE: Multidetector CT imaging of the chest was performed using the standard protocol during bolus administration of intravenous contrast. Multiplanar CT image reconstructions and MIPs were obtained to evaluate the vascular anatomy.  CONTRAST:  152mL OMNIPAQUE IOHEXOL 350 MG/ML SOLN IV  COMPARISON:  None  FINDINGS: Scattered atherosclerotic calcifications aorta and coronary arteries.  Aorta normal  caliber.  Asymmetric gynecomastia slightly greater on LEFT.  Pulmonary arteries patent.  No evidence of pulmonary embolism.  BILATERAL pleural effusions moderate LEFT and large RIGHT.  Minimal pericardial effusion.  Extensive adenopathy including:  RIGHT peritracheal node 26 mm short axis image 38  Precarinal nodes 20 mm and 15 mm image 45.  Subcarinal node 26 mm image 54.  LEFT hilar 14 mm image 56.  RIGHT supraclavicular 21 mm image 6  Retrocrural 21 mm image 90  Gastrohepatic ligament 37 mm image 103  Additional scattered upper abdominal, mediastinal and inferior cervical lymph nodes bilaterally.  Questionable focal nodule in LEFT lower lobe 19 mm image 70.  Significant compressive atelectasis of RIGHT greater than LEFT lungs.  Emphysematous changes at apices.  Narrowing of LEFT upper and LEFT lower lobe bronchi by hilar adenopathy.  No additional mass, nodule or infiltrate.  Minimal displacement of trachea to the LEFT by paratracheal adenopathy.  Slightly heterogeneous density of the thoracic vertebrae without focal lesion.  Review of the MIP images confirms the above findings.  IMPRESSION: No evidence of pulmonary embolism.  Extensive thoracic and upper abdominal adenopathy.  Questionable nodule versus atelectasis in LEFT lower lobe.  BILATERAL pleural effusions RIGHT greater than LEFT with associated atelectasis.  Findings may represent metastatic disease or lymphoma.  Findings called to Dr. Mathis Bud on 09/10/2014 at 1345  hr.   Electronically Signed   By: Lavonia Dana M.D.   On: 09/10/2014 13:45   Ct Abdomen Pelvis W Contrast  09/11/2014   CLINICAL DATA:  Weakness, pleural effusions in lymphadenopathy  EXAM: CT ABDOMEN AND PELVIS WITH CONTRAST  TECHNIQUE: Multidetector CT imaging of the abdomen and pelvis was performed using the standard protocol following bolus administration of intravenous contrast.  CONTRAST:  129mL OMNIPAQUE IOHEXOL 300 MG/ML  SOLN  COMPARISON:  09/10/2014  FINDINGS: Lung bases again demonstrate lower lobe consolidation with bilateral pleural effusions right greater than left. The overall appearance is similar to that seen on the prior CT examination. Considerable subcarinal and hilar adenopathy is identified. A mild pericardial effusion is noted.  The liver shows no focal mass lesion. Some mild periportal edema is noted. The gallbladder show some surrounding pericholecystic fluid likely related to mild ascites. No definitive cholelithiasis is seen. The spleen is within normal limits. The adrenal glands appear within normal limits as does the pancreas.  The kidneys show a normal enhancement pattern bilaterally. No renal calculi or obstructive changes are seen. Delayed images demonstrate normal excretion of contrast material.  Multifocal lymphadenopathy is identified throughout the abdomen. Retrocrural nodes are again noted on the right similar to that seen on recent CT. Surrounding the celiac axis in the gastrohepatic ligament, there is a large conglomeration of lymph nodes. The largest component of this is to the left of the celiac axis measuring 3.7 x 4.1 cm best seen on image number 30 of series 2. This extends into the porta hepatis. Significant periaortic, pericaval and intra-aortic caval adenopathy is noted. The largest of these nodes lies in the left periaortic space measuring 2.4 cm in short axis. Portacaval adenopathy is noted as well. This measures 2.5 cm in short axis. Mild inguinal lymphadenopathy  is noted. The iliac chains appear clear aortoiliac calcifications are noted. The bony structures show degenerative change of the lumbar spine. No definitive osteolytic or osteoblastic lesions are seen.  The appendix is not well visualized although no inflammatory changes are noted. Scattered diverticular change of the colon is seen.  IMPRESSION: Bilateral pleural effusions with  bilateral lower lobe consolidation.  Diffuse lymphadenopathy throughout the abdomen as described. Again these changes may represent metastatic disease or diffuse lymphoma. Tissue sampling may be helpful.  Mild ascites.   Electronically Signed   By: Inez Catalina M.D.   On: 09/11/2014 10:18   US Thoracentesis Asp Pleural Space W/img Guide  09/11/2014   CLINICAL DATA:  Shortness of breath, bilateral pleural effusions right greater than left. Request diagnostic and therapeutic thoracentesis.  EXAM: ULTRASOUND GUIDED RIGHT THORACENTESIS  COMPARISON:  None.  PROCEDURE: An ultrasound guided thoracentesis was thoroughly discussed with the patient and questions answered. The benefits, risks, alternatives and complications were also discussed. The patient understands and wishes to proceed with the procedure. Written consent was obtained.  Ultrasound was performed to localize and mark an adequate pocket of fluid in the right chest. The area was then prepped and draped in the normal sterile fashion. 1% Lidocaine was used for local anesthesia. Under ultrasound guidance, a 6 French Safe-T-Centesis catheter was introduced. Thoracentesis was performed. The catheter was removed and a dressing applied.  COMPLICATIONS: None  FINDINGS: A total of approximately 1.7 L of hazy, yellow fluid was removed. A fluid sample wassent for laboratory analysis.  IMPRESSION: Successful ultrasound guided bright thoracentesis yielding 1.7 L of pleural fluid.  Read by: Ascencion Dike PA-C   Electronically Signed   By: Aletta Edouard M.D.   On: 09/11/2014 10:56    EKG:  NR  with LVH and secondary repolarization abnormality  ECHOCARDIOGRAM: 09/11/14 Study Conclusions  - Left ventricle: The cavity size was mildly dilated. Wall thickness was normal. Systolic function was severely reduced. The estimated ejection fraction was in the range of 20% to 25%. Diffuse hypokinesis. Doppler parameters are consistent with abnormal left ventricular relaxation (grade 1 diastolic dysfunction). - Left atrium: The atrium was moderately dilated. - Pericardium, extracardiac: A small pericardial effusion was identified. There was a left pleural effusion.   Sinclair Grooms 09/12/2014 8:07 AM

## 2014-09-12 NOTE — Progress Notes (Signed)
PT Cancellation Note  Patient Details Name: Ralph [name] MRN: [id number] DOB: 1951/05/11   Cancelled Treatment:    Reason Eval/Treat Not Completed: Patient at procedure or test/unavailable; Pt gone for lymph node bx. Will check back at later date   [facility] 09/12/2014, 9:32 AM

## 2014-09-12 NOTE — Progress Notes (Signed)
[facility] PROGRESS NOTE  Ralph [name] JIR:[id number] DOB: [birth year] DOA: 09/10/2014 PCP: No PCP Per Patient  Brief Summary  63 year old male with past medical history of diabetes (managed with PO meds), anxiety, depression who presented to [facility] ED 09/10/2014 with worsening shortness of breath, dry cough, and leg swelling.  He also reported fatigue and weakness and feeling "really sick".  In ED, BP was 177/95 and oxygen saturation was 89% on room air.  CT angio chest neg for pulmonary embolism but showed extensive thoracic and upper abdominal adenopathy questionable nodule versus atelectasis in LEFT lower lobe, bilateral pleural effusions greater on the right.  He underwent thoracentesis and had a transudative effusion.  Duplex lower extremity was negative for DVT.  ECHO demonstrated EF of 20-25% in patient with no previous history of CAD.  Cardiology was consulted and he is undergoing diuresis.  He had excisional LN biopsy on 10/27 and oncology to consult based on pending pathology.  Assessment/Plan  Acute respiratory failure with hypoxia secondary to large right pleural effusion may be secondary to acute systolic heart failure.  -  Thoracentesis by radiology:  Appears transudative -  F/u pleural fluid culture and cytology -  ECHO:  Mildly dilated LVF, severely reduced systolic function, EF of 75-10%, diffuse hypokinesis, and grade 1 DD, left atrium moderately dilated, small pericardial effusion -  Ferritin wnl, no globulin gap to suggest MM or amyloid  -  Started on ACEI, but creatinine increased so discontinued -  Started bidil TID -  Continue beta blocker -  Strict I/O and daily weights -  Lasix per cardiology -  Cardiology consulted for possible ischemic work up given ECG findings (possible previous septal infarct, lateral T-wave inversions) -  Wean oxygen as tolerated  Diffuse lymphadenopathy found incidentally on CT of the chest -  Patient is reportedly up-to-date on his colon  cancer and prostate screening  -  CT of the abdomen and pelvis: Diffuse adenopathy, no evidence of mass -  LDH 499 H, uric acid 7.2, hepatitis panel neg, HIV NR, beta-2 microglobulin pending, ESR 8 -  Please consult Oncology after the results of the biopsy  -  Appreciate general surgery assistance:  Excisional LN biopsy performed on 10/27  Generalized weakness with progressive failure to thrive  -  PT/OT   Likely severe protein calorie malnutrition. Patient denies that he has had weight loss but his family states he has lost a lot of weight over the last year -  Liberalized diet -  Supplements -  Nutrition consultation  Hypertension, with elevated blood pressures -  Started on metoprolol  -  Add bidil -  Hold ACEI due to AKI  Nonsustained VT - Keep electrolytes within normal limits - Start beta blocker  Diabetes mellitus, hemoglobin A1c was previously around 12, currently 7.6, possibly due to weight loss, CBG mildly elevated despite NPO for procedure -  Diabetic diet -  Increase to mod sliding-scale insulin with meals -  Increase to levemir 15 units  Acute on CKD stage 3 due to diabetes and HTN plus recent IV contrast.  Significant proteinuria on exam -  Renally dose medications -  Minimize nephrotoxins  -  Urine protein-creatinine   Anxiety and depression, continue clonazepam and lexapro  Leukocytosis, patient afebrile.  CXR and UA neg  Normocytic anemia, hgb approximately stable, likely due to chronic disease -  Iron studies wnl, b12 623, folate 1160, TSH 3.62 -  Occult stool if able   Cigarette nicotine  abuse and dependence with withdrawal.  Smokes a carton per week -  Smoking cessation counseling -  Nicotine patch  Diet:  diabetic Access:  PIV IVF:  off Proph:  lovenox  Code Status: full Family Communication: patient and niece who was at bedside Disposition Plan:  Diuresing and awaiting pathology report   Consultants:  IR   General surgery  Oncology  Dr. Lona Kettle by phone  Cardiology, Dr. [name]  Procedures:  CXR  CT angio chest  CT ab/p   US thoracentesis on 10/26  Antibiotics:  None   HPI/Subjective:  SOB improved.  Feeling sleepy today because he did not sleep well last night.  Right neck hurting post biopsy.     Objective: Filed Vitals:   09/12/14 1123 09/12/14 1130 09/12/14 1145 09/12/14 1200  BP:  152/77 158/83 166/95  Pulse: 95 98 100 101  Temp:   98.2 F (36.8 C) 97.8 F (36.6 C)  TempSrc:      Resp:  19 17 18   Height:      Weight:      SpO2: 94% 97% 94% 95%    Intake/Output Summary (Last 24 hours) at 09/12/14 1301 Last data filed at 09/12/14 1130  Gross per 24 hour  Intake   1130 ml  Output    520 ml  Net    610 ml   Filed Weights   09/10/14 1801  Weight: 81.647 kg (180 lb)    Exam:   General:  Mildly cachectic BM, No acute distress  HEENT:  NCAT, MMM, right neck dressing c/d/i  Cardiovascular:  RRR, nl S1, S2 no mrg, 2+ pulses, warm extremities  Respiratory:  Diminished BS on the right base only, otherwise CTAB, no increased WOB  Abdomen:   NABS, soft, NT/ND  MSK:   Normal tone and bulk, 2+ pitting LLE and 1+ pitting RLE edema  Neuro:  Grossly intact  Data Reviewed: Basic Metabolic Panel:  Recent Labs Lab 09/10/14 1223 09/10/14 1803 09/11/14 0400 09/12/14 0620  NA 143 139 143 142  K 3.8 3.6* 3.3* 3.7  CL 103 101 104 104  CO2 27 27 28 27   GLUCOSE 231* 248* 214* 173*  BUN 23 22 23  27*  CREATININE 1.49* 1.44* 1.44* 1.90*  CALCIUM 8.1* 8.1* 7.9* 8.2*  MG  --  1.7  --   --   PHOS  --  3.6  --   --    Liver Function Tests:  Recent Labs Lab 09/10/14 1223 09/10/14 1803 09/11/14 0400  AST 22 19 17   ALT 12 12 12   ALKPHOS 103 102 98  BILITOT 0.3 0.3 0.3  PROT 6.0 5.9* 5.5*  ALBUMIN 2.8* 2.8* 2.6*   No results found for this basename: LIPASE, AMYLASE,  in the last 168 hours No results found for this basename: AMMONIA,  in the last 168 hours CBC:  Recent  Labs Lab 09/10/14 1223 09/10/14 1803 09/11/14 0400 09/12/14 0620  WBC 10.2 9.3 9.6 12.3*  NEUTROABS 7.5 6.4  --   --   HGB 10.9* 10.5* 10.0* 10.8*  HCT 31.7* 30.9* 28.8* 32.4*  MCV 82.3 82.2 83.5 82.9  PLT 175 170 190 217   Cardiac Enzymes:  Recent Labs Lab 09/11/14 2030 09/12/14 0035 09/12/14 0620  TROPONINI <0.30 <0.30 <0.30   BNP (last 3 results)  Recent Labs  09/12/14 0620  PROBNP 8587.0*   CBG:  Recent Labs Lab 09/11/14 1729 09/11/14 2220 09/12/14 0744 09/12/14 1128 09/12/14 1203  GLUCAP 83 158* 191*  187* 220*    Recent Results (from the past 240 hour(s))  BODY FLUID CULTURE     Status: None   Collection Time    09/11/14 10:28 AM      Result Value Ref Range Status   Specimen Description PLEURAL RIGHT   Final   Special Requests Immunocompromised   Final   Gram Stain     Final   Value: NO WBC SEEN     NO ORGANISMS SEEN     Performed at [facility]   Culture     Final   Value: NO GROWTH 1 DAY     Performed at [facility]   Report Status PENDING   Incomplete  SURGICAL PCR SCREEN     Status: Abnormal   Collection Time    09/12/14  8:39 AM      Result Value Ref Range Status   MRSA, PCR NEGATIVE  NEGATIVE Final   Staphylococcus aureus POSITIVE (*) NEGATIVE Final   Comment:            The Xpert SA Assay (FDA     approved for NASAL specimens     in patients over 33 years of age),     is one component of     a comprehensive surveillance     program.  Test performance has     been validated by [employer] for patients greater     than or equal to 47 year old.     It is not intended     to diagnose infection nor to     guide or monitor treatment.     Studies: Dg Chest 1 View  09/11/2014   CLINICAL DATA:  Post right thoracentesis  EXAM: CHEST - 1 VIEW  COMPARISON:  09/10/2014  FINDINGS: Decreased right pleural effusion following thoracentesis. Small right effusion remains. No pneumothorax. Right lower lobe atelectasis has  improved  Small left effusion and left lower lobe atelectasis unchanged. Negative for heart failure. Right peritracheal adenopathy noted  IMPRESSION: Decreased right pleural effusion following thoracentesis. No pneumothorax.   Electronically Signed   By: Franchot Gallo M.D.   On: 09/11/2014 11:01   Ct Angio Chest Pe W/cm &/or Wo Cm  09/10/2014   CLINICAL DATA:  Shortness of breath, increased burping for 2 days, had a cold that feels like it is getting worse, history hypertension, diabetes mellitus, anxiety  EXAM: CT ANGIOGRAPHY CHEST WITH CONTRAST  TECHNIQUE: Multidetector CT imaging of the chest was performed using the standard protocol during bolus administration of intravenous contrast. Multiplanar CT image reconstructions and MIPs were obtained to evaluate the vascular anatomy.  CONTRAST:  172m OMNIPAQUE IOHEXOL 350 MG/ML SOLN IV  COMPARISON:  None  FINDINGS: Scattered atherosclerotic calcifications aorta and coronary arteries.  Aorta normal caliber.  Asymmetric gynecomastia slightly greater on LEFT.  Pulmonary arteries patent.  No evidence of pulmonary embolism.  BILATERAL pleural effusions moderate LEFT and large RIGHT.  Minimal pericardial effusion.  Extensive adenopathy including:  RIGHT peritracheal node 26 mm Farida Mcreynolds axis image 38  Precarinal nodes 20 mm and 15 mm image 45.  Subcarinal node 26 mm image 54.  LEFT hilar 14 mm image 56.  RIGHT supraclavicular 21 mm image 6  Retrocrural 21 mm image 90  Gastrohepatic ligament 37 mm image 103  Additional scattered upper abdominal, mediastinal and inferior cervical lymph nodes bilaterally.  Questionable focal nodule in LEFT lower lobe 19 mm image 70.  Significant compressive atelectasis  of RIGHT greater than LEFT lungs.  Emphysematous changes at apices.  Narrowing of LEFT upper and LEFT lower lobe bronchi by hilar adenopathy.  No additional mass, nodule or infiltrate.  Minimal displacement of trachea to the LEFT by paratracheal adenopathy.  Slightly  heterogeneous density of the thoracic vertebrae without focal lesion.  Review of the MIP images confirms the above findings.  IMPRESSION: No evidence of pulmonary embolism.  Extensive thoracic and upper abdominal adenopathy.  Questionable nodule versus atelectasis in LEFT lower lobe.  BILATERAL pleural effusions RIGHT greater than LEFT with associated atelectasis.  Findings may represent metastatic disease or lymphoma.  Findings called to Dr. Mathis Bud on 09/10/2014 at 1345 hr.   Electronically Signed   By: Lavonia Dana M.D.   On: 09/10/2014 13:45   Ct Abdomen Pelvis W Contrast  09/11/2014   CLINICAL DATA:  Weakness, pleural effusions in lymphadenopathy  EXAM: CT ABDOMEN AND PELVIS WITH CONTRAST  TECHNIQUE: Multidetector CT imaging of the abdomen and pelvis was performed using the standard protocol following bolus administration of intravenous contrast.  CONTRAST:  148m OMNIPAQUE IOHEXOL 300 MG/ML  SOLN  COMPARISON:  09/10/2014  FINDINGS: Lung bases again demonstrate lower lobe consolidation with bilateral pleural effusions right greater than left. The overall appearance is similar to that seen on the prior CT examination. Considerable subcarinal and hilar adenopathy is identified. A mild pericardial effusion is noted.  The liver shows no focal mass lesion. Some mild periportal edema is noted. The gallbladder show some surrounding pericholecystic fluid likely related to mild ascites. No definitive cholelithiasis is seen. The spleen is within normal limits. The adrenal glands appear within normal limits as does the pancreas.  The kidneys show a normal enhancement pattern bilaterally. No renal calculi or obstructive changes are seen. Delayed images demonstrate normal excretion of contrast material.  Multifocal lymphadenopathy is identified throughout the abdomen. Retrocrural nodes are again noted on the right similar to that seen on recent CT. Surrounding the celiac axis in the gastrohepatic ligament, there is a  large conglomeration of lymph nodes. The largest component of this is to the left of the celiac axis measuring 3.7 x 4.1 cm best seen on image number 30 of series 2. This extends into the porta hepatis. Significant periaortic, pericaval and intra-aortic caval adenopathy is noted. The largest of these nodes lies in the left periaortic space measuring 2.4 cm in [place] Pizzimenti axis. Portacaval adenopathy is noted as well. This measures 2.5 cm in Tamecia Mcdougald axis. Mild inguinal lymphadenopathy is noted. The iliac chains appear clear aortoiliac calcifications are noted. The bony structures show degenerative change of the lumbar spine. No definitive osteolytic or osteoblastic lesions are seen.  The appendix is not well visualized although no inflammatory changes are noted. Scattered diverticular change of the colon is seen.  IMPRESSION: Bilateral pleural effusions with bilateral lower lobe consolidation.  Diffuse lymphadenopathy throughout the abdomen as described. Again these changes may represent metastatic disease or diffuse lymphoma. Tissue sampling may be helpful.  Mild ascites.   Electronically Signed   By: MInez CatalinaM.D.   On: 09/11/2014 10:18   [place]oreaThoracentesis Asp Pleural Space W/img Guide  09/11/2014   CLINICAL DATA:  Shortness of breath, bilateral pleural effusions right greater than left. Request diagnostic and therapeutic thoracentesis.  EXAM: ULTRASOUND GUIDED RIGHT THORACENTESIS  COMPARISON:  None.  PROCEDURE: An ultrasound guided thoracentesis was thoroughly discussed with the patient and questions answered. The benefits, risks, alternatives and complications were also discussed. The patient understands and wishes to  proceed with the procedure. Written consent was obtained.  Ultrasound was performed to localize and mark an adequate pocket of fluid in the right chest. The area was then prepped and draped in the normal sterile fashion. 1% Lidocaine was used for local anesthesia. Under ultrasound guidance, a 6 French  Safe-T-Centesis catheter was introduced. Thoracentesis was performed. The catheter was removed and a dressing applied.  COMPLICATIONS: None  FINDINGS: A total of approximately 1.7 L of hazy, yellow fluid was removed. A fluid sample wassent for laboratory analysis.  IMPRESSION: Successful ultrasound guided bright thoracentesis yielding 1.7 L of pleural fluid.  Read by: Ascencion Dike PA-C   Electronically Signed   By: Aletta Edouard M.D.   On: 09/11/2014 10:56    Scheduled Meds: . escitalopram  10 mg Oral Daily  . feeding supplement (GLUCERNA SHAKE)  237 mL Oral TID BM  . furosemide  40 mg Intravenous 3 times per day  . insulin aspart  0-9 Units Subcutaneous TID WC  . insulin detemir  10 Units Subcutaneous Daily  . isosorbide-hydrALAZINE  1 tablet Oral TID  . metoprolol tartrate  25 mg Oral BID  . nicotine  21 mg Transdermal Daily  . potassium chloride  40 mEq Oral Daily   Continuous Infusions: . sodium chloride 10 mL/hr at 09/10/14 1812    Principal Problem:   Respiratory failure with hypoxia Active Problems:   Essential hypertension   Pleural effusion, right   Diabetes mellitus type 2, controlled, without complications   Anemia   Acute renal failure   Protein-calorie malnutrition, severe   Acute systolic congestive heart failure    Time spent: 30 min    Nikkita Adeyemi, [facility] Pager [phone]. If 7PM-7AM, please contact night-coverage at [email or web address], password [email or web address] 09/12/2014, 1:01 PM  LOS: 2 days

## 2014-09-12 NOTE — Transfer of Care (Signed)
Immediate Anesthesia Transfer of Care Note  Patient: Ralph [name]  Procedure(s) Performed: Procedure(s): RIGHT NECK LYMPH NODE BIOPSY (Right)  Patient Location: PACU  Anesthesia Type:MAC  Level of Consciousness: awake, alert  and oriented  Airway & Oxygen Therapy: Patient Spontanous Breathing and Patient connected to face mask oxygen  Post-op Assessment: Report given to PACU RN and Post -op Vital signs reviewed and stable  Post vital signs: Reviewed and stable  Complications: No apparent anesthesia complications

## 2014-09-12 NOTE — Op Note (Signed)
Operative Note  Ralph [name] male 63 y.o. 09/12/2014  PREOPERATIVE DX:  Diffuse lymphadenopathy  POSTOPERATIVE DX:  Same  PROCEDURE:   Right supraclavicular lymph node biopsy         Surgeon: Odis Hollingshead   Assistants: none  Anesthesia: Monitored Local Anesthesia with Sedation  Indications:   This is a 63 year old male with diffuse lymphadenopathy. He now presents for lymph node biopsy to obtain diagnosis. The procedure, risks, we discussed with him.    Procedure Detail:  He was seen in the holding area and the right neck was marked my initials. He is brought to appendectomy room placed supine on the operating table and intravenous sedation was given. The right neck and upper chest were sterilely prepped and draped.  Transverse incision was made in the right supraclavicular region over the area of the palpable lymph node. Subcutaneous tissue is divided . The platysma muscle was divided.  The lymph node was palpable deep to the sternocleidomastoid muscle. Part the sternocleidomastoid muscle was divided exposing the enlarged supraclavicular lymph node. I took multiple biopsies of the enlarged lymph node sharply. These were sent fresh to pathology.  Bleeding was controlled with electrocautery.  Once hemostasis was adequate, Surgicel was placed into the wound. The sternocleidomastoid muscle was approximated with interrupted 3-0 Vicryl sutures. The platysma muscle was approximated with interrupted 3-0 Vicryl sutures. The skin was closed with a running 4-0 Monocryl subcuticular stitch. Steri-Strips and sterile dressing were applied.  He tolerated the procedure well without any apparent complications and was taken to the recovery room in satisfactory condition.   Estimated Blood Loss:  less than 100 mL         Specimens: right supraclavicular lymph node biopsies        Complications:  * No complications entered in OR log *         Disposition: PACU - hemodynamically stable.       Condition: stable

## 2014-09-12 NOTE — Anesthesia Preprocedure Evaluation (Signed)
Anesthesia Evaluation  Patient identified by MRN, date of birth, ID band Patient awake    Reviewed: Allergy & Precautions, H&P , NPO status , Patient's Chart, lab work & pertinent test results  Airway Mallampati: II  TM Distance: >3 FB Neck ROM: Full    Dental no notable dental hx.    Pulmonary Current Smoker,  breath sounds clear to auscultation  Pulmonary exam normal       Cardiovascular hypertension, +CHF negative cardio ROS  Rhythm:Regular Rate:Normal  CXR with decreased pleural effusion following thoracentesis.  ECG with lateral repolarization abnormality.   Neuro/Psych Anxiety negative neurological ROS     GI/Hepatic negative GI ROS, Neg liver ROS,   Endo/Other  negative endocrine ROSdiabetes, Type 2, Oral Hypoglycemic Agents  Renal/GU Renal InsufficiencyRenal disease  negative genitourinary   Musculoskeletal negative musculoskeletal ROS (+)   Abdominal   Peds negative pediatric ROS (+)  Hematology  (+) anemia ,   Anesthesia Other Findings   Reproductive/Obstetrics negative OB ROS                             Anesthesia Physical Anesthesia Plan  ASA: IV  Anesthesia Plan: General   Post-op Pain Management:    Induction:   Airway Management Planned: LMA  Additional Equipment:   Intra-op Plan:   Post-operative Plan:   Informed Consent:   Plan Discussed with:   Anesthesia Plan Comments:         Anesthesia Quick Evaluation

## 2014-09-13 ENCOUNTER — Encounter (HOSPITAL_COMMUNITY): Payer: Self-pay | Admitting: General Surgery

## 2014-09-13 DIAGNOSIS — Z803 Family history of malignant neoplasm of breast: Secondary | ICD-10-CM

## 2014-09-13 DIAGNOSIS — C770 Secondary and unspecified malignant neoplasm of lymph nodes of head, face and neck: Secondary | ICD-10-CM

## 2014-09-13 DIAGNOSIS — C3432 Malignant neoplasm of lower lobe, left bronchus or lung: Secondary | ICD-10-CM

## 2014-09-13 DIAGNOSIS — D638 Anemia in other chronic diseases classified elsewhere: Secondary | ICD-10-CM

## 2014-09-13 DIAGNOSIS — I739 Peripheral vascular disease, unspecified: Secondary | ICD-10-CM

## 2014-09-13 DIAGNOSIS — Z720 Tobacco use: Secondary | ICD-10-CM

## 2014-09-13 DIAGNOSIS — J9691 Respiratory failure, unspecified with hypoxia: Secondary | ICD-10-CM

## 2014-09-13 LAB — GLUCOSE, CAPILLARY
GLUCOSE-CAPILLARY: 88 mg/dL (ref 70–99)
Glucose-Capillary: 131 mg/dL — ABNORMAL HIGH (ref 70–99)
Glucose-Capillary: 152 mg/dL — ABNORMAL HIGH (ref 70–99)
Glucose-Capillary: 107 mg/dL — ABNORMAL HIGH (ref 70–99)

## 2014-09-13 LAB — PROTEIN / CREATININE RATIO, URINE
Creatinine, Urine: 104.27 mg/dL
PROTEIN CREATININE RATIO: 3.52 — AB (ref 0.00–0.15)
TOTAL PROTEIN, URINE: 366.6 mg/dL

## 2014-09-13 LAB — BASIC METABOLIC PANEL
Anion gap: 12 (ref 5–15)
BUN: 31 mg/dL — AB (ref 6–23)
CHLORIDE: 105 meq/L (ref 96–112)
CO2: 26 mEq/L (ref 19–32)
CREATININE: 2.63 mg/dL — AB (ref 0.50–1.35)
Calcium: 7.8 mg/dL — ABNORMAL LOW (ref 8.4–10.5)
GFR, EST AFRICAN AMERICAN: 28 mL/min — AB (ref >90)
GFR, EST NON AFRICAN AMERICAN: 24 mL/min — AB (ref >90)
Glucose, Bld: 124 mg/dL — ABNORMAL HIGH (ref 70–99)
Potassium: 4 mEq/L (ref 3.7–5.3)
Sodium: 143 mEq/L (ref 137–147)

## 2014-09-13 LAB — CBC
HCT: 26.8 % — ABNORMAL LOW (ref 39.0–52.0)
Hemoglobin: 9.1 g/dL — ABNORMAL LOW (ref 13.0–17.0)
MCH: 29.1 pg (ref 26.0–34.0)
MCHC: 34 g/dL (ref 30.0–36.0)
MCV: 85.6 fL (ref 78.0–100.0)
PLATELETS: 165 10*3/uL (ref 150–400)
RBC: 3.13 MIL/uL — ABNORMAL LOW (ref 4.22–5.81)
RDW: 17.5 % — AB (ref 11.5–15.5)
WBC: 9.5 10*3/uL (ref 4.0–10.5)

## 2014-09-13 LAB — BETA 2 MICROGLOBULIN, SERUM: Beta-2 Microglobulin: 5.8 mg/L — ABNORMAL HIGH (ref <=2.51)

## 2014-09-13 MED ORDER — CARVEDILOL 6.25 MG PO TABS
6.2500 mg | ORAL_TABLET | Freq: Two times a day (BID) | ORAL | Status: DC
  Administered 2014-09-13 – 2014-09-21 (×16): 6.25 mg via ORAL
  Filled 2014-09-13 (×21): qty 1

## 2014-09-13 MED ORDER — FUROSEMIDE 10 MG/ML IJ SOLN
40.0000 mg | Freq: Every day | INTRAMUSCULAR | Status: DC
  Filled 2014-09-13: qty 4

## 2014-09-13 NOTE — Progress Notes (Addendum)
[facility] PROGRESS NOTE  EISA NECAISE GHW:[id number] DOB: [birth year] DOA: 09/10/2014 PCP: No PCP Per Patient  Brief Summary  63 year old male with past medical history of diabetes (managed with PO meds), anxiety, depression who presented to [facility] ED 09/10/2014 with worsening shortness of breath, dry cough, and leg swelling.  He also reported fatigue and weakness and feeling "really sick".  In ED, BP was 177/95 and oxygen saturation was 89% on room air.  CT angio chest neg for pulmonary embolism but showed extensive thoracic and upper abdominal adenopathy questionable nodule versus atelectasis in LEFT lower lobe, bilateral pleural effusions greater on the right.  He underwent thoracentesis and had a transudative effusion.  Duplex lower extremity was negative for DVT.  ECHO demonstrated EF of 20-25% in patient with no previous history of CAD.  Cardiology was consulted and he is undergoing diuresis.  He had excisional LN biopsy on 10/27 and oncology to consult based on pending pathology.  Assessment/Plan  Acute respiratory failure with hypoxia secondary to large right pleural effusion may be secondary to acute systolic heart failure.  -  Thoracentesis perform by radiology on 10-26 yielding 1.7 L.  -  F/u pleural fluid culture and cytology: with malignant cell consistent with adenocarcinoma. No bacteria growth.  -  ECHO:  Mildly dilated LVF, severely reduced systolic function, EF of 78-93%, diffuse hypokinesis, and grade 1 DD, left atrium moderately dilated, small pericardial effusion -  Ferritin wnl, no globulin gap to suggest MM or amyloid  -  Started on ACEI, but creatinine increased so discontinued -  Started bidil TID -  Continue beta blocker -  Strict I/O and daily weights -  Lasix per cardiology -  Cardiology consulted for possible ischemic work up given ECG findings (possible previous septal infarct, lateral T-wave inversions) -  Wean oxygen as tolerated  Metastatic Adenocarcinoma  probably from Lung primary.  -Diffuse lymphadenopathy found incidentally on CT of the chest - Patient is reportedly up-to-date on his colon cancer and prostate screening  - CT of the abdomen and pelvis: Diffuse adenopathy, no evidence of mass - LDH 499 H, uric acid 7.2, hepatitis panel neg, HIV NR, beta-2 microglobulin pending, ESR 8 - Appreciate general surgery assistance: Excisional LN biopsy performed on 10/27 -pathology from lymph node biopsy showed adenocarcinoma.  -oncology consulted.  -He will need MRI brain rule out metastatic diseases, But patient with worsening renal function. Will defer to oncology. Might need to wait due to renal failure.   Generalized weakness with progressive failure to thrive  -  PT/OT   Likely severe protein calorie malnutrition. Patient denies that he has had weight loss but his family states he has lost a lot of weight over the last year -  Liberalized diet -  Supplements -  Nutrition consultation  Hypertension, with elevated blood pressures -  Started on metoprolol  -  Add bidil -  Hold ACEI due to AKI  Nonsustained VT - Keep electrolytes within normal limits - Start beta blocker.  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  duplex ordered.    Diabetes mellitus, hemoglobin A1c was previously around 12, currently 7.6, possibly due to weight loss, CBG mildly elevated despite NPO for procedure -  Diabetic diet -  Increase to mod sliding-scale insulin with meals -  Increase to levemir 15 units  Acute on CKD stage 3 due to diabetes and HTN plus recent IV contrast.  Significant proteinuria on exam -  Renally dose medications -  Minimize nephrotoxins  -  Urine protein-creatinine  -monitor on lasix.   Anxiety and depression, continue clonazepam and lexapro  Leukocytosis, patient afebrile.  CXR and UA neg  Normocytic anemia, hgb approximately stable, likely due to chronic disease -   Iron studies wnl, b12 623, folate 1160, TSH 3.62 -  Occult stool if able   Cigarette nicotine abuse and dependence with withdrawal.  Smokes a carton per week -  Smoking cessation counseling -  Nicotine patch  Diet:  diabetic Access:  PIV IVF:  off Proph:  lovenox  Code Status: full Family Communication: patient .  Disposition Plan:  Awaiting improvement of renal function and further recommendation from [name].    Consultants:  IR   General surgery  Oncology Dr. Lona Kettle by phone  Cardiology, Dr. [name]  Procedures:  CXR  CT angio chest  CT ab/p   US thoracentesis on 10/26  Antibiotics:  None   HPI/Subjective: Breathing better. Denies pain. I informed him results from biopsy.   Objective: Filed Vitals:   09/12/14 1409 09/12/14 1413 09/12/14 2207 09/13/14 0459  BP: 109/62 113/56 142/79 138/67  Pulse: 97  89 84  Temp: 97.8 F (36.6 C)  98.3 F (36.8 C) 98 F (36.7 C)  TempSrc: Oral  Oral Oral  Resp: 18  20 20   Height:      Weight:    83.371 kg (183 lb 12.8 oz)  SpO2: 95%  91% 97%    Intake/Output Summary (Last 24 hours) at 09/13/14 1347 Last data filed at 09/13/14 1100  Gross per 24 hour  Intake   1180 ml  Output   1010 ml  Net    170 ml   Filed Weights   09/10/14 1801 09/13/14 0459  Weight: 81.647 kg (180 lb) 83.371 kg (183 lb 12.8 oz)    Exam:   General:  Mildly cachectic   HEENT: right neck dressing c/d/i  Cardiovascular:  RRR, nl S1, S2 no mrg  Respiratory:  Diminished BS on the right base only, otherwise CTAB, no increased WOB  Abdomen:   NABS, soft, NT/ND  MSK:   Normal tone and bulk, 2+ pitting LLE and 1+ pitting RLE edema  Data Reviewed: Basic Metabolic Panel:  Recent Labs Lab 09/10/14 1223 09/10/14 1803 09/11/14 0400 09/12/14 0620 09/13/14 0325  NA 143 139 143 142 143  K 3.8 3.6* 3.3* 3.7 4.0  CL 103 101 104 104 105  CO2 27 27 28 27 26   GLUCOSE 231* 248* 214* 173* 124*  BUN 23 22 23  27* 31*  CREATININE  1.49* 1.44* 1.44* 1.90* 2.63*  CALCIUM 8.1* 8.1* 7.9* 8.2* 7.8*  MG  --  1.7  --   --   --   PHOS  --  3.6  --   --   --    Liver Function Tests:  Recent Labs Lab 09/10/14 1223 09/10/14 1803 09/11/14 0400  AST 22 19 17   ALT 12 12 12   ALKPHOS 103 102 98  BILITOT 0.3 0.3 0.3  PROT 6.0 5.9* 5.5*  ALBUMIN 2.8* 2.8* 2.6*   No results found for this basename: LIPASE, AMYLASE,  in the last 168 hours No results found for this basename: AMMONIA,  in the last 168 hours CBC:  Recent Labs Lab 09/10/14 1223 09/10/14 1803 09/11/14 0400 09/12/14 0620 09/13/14 0325  WBC 10.2 9.3 9.6 12.3* 9.5  NEUTROABS 7.5 6.4  --   --   --  HGB 10.9* 10.5* 10.0* 10.8* 9.1*  HCT 31.7* 30.9* 28.8* 32.4* 26.8*  MCV 82.3 82.2 83.5 82.9 85.6  PLT 175 170 190 217 165   Cardiac Enzymes:  Recent Labs Lab 09/11/14 2030 09/12/14 0035 09/12/14 0620  TROPONINI <0.30 <0.30 <0.30   BNP (last 3 results)  Recent Labs  09/12/14 0620  PROBNP 8587.0*   CBG:  Recent Labs Lab 09/12/14 1203 09/12/14 1700 09/12/14 2207 09/13/14 0723 09/13/14 1145  GLUCAP 220* 205* 102* 131* 152*    Recent Results (from the past 240 hour(s))  BODY FLUID CULTURE     Status: None   Collection Time    09/11/14 10:28 AM      Result Value Ref Range Status   Specimen Description PLEURAL RIGHT   Final   Special Requests Immunocompromised   Final   Gram Stain     Final   Value: NO WBC SEEN     NO ORGANISMS SEEN     Performed at [facility]   Culture     Final   Value: NO GROWTH 2 DAYS     Performed at [facility]   Report Status PENDING   Incomplete  SURGICAL PCR SCREEN     Status: Abnormal   Collection Time    09/12/14  8:39 AM      Result Value Ref Range Status   MRSA, PCR NEGATIVE  NEGATIVE Final   Staphylococcus aureus POSITIVE (*) NEGATIVE Final   Comment:            The Xpert SA Assay (FDA     approved for NASAL specimens     in patients over 71 years of age),     is one component  of     a comprehensive surveillance     program.  Test performance has     been validated by [employer] for patients greater     than or equal to 42 year old.     It is not intended     to diagnose infection nor to     guide or monitor treatment.     Studies: No results found.  Scheduled Meds: . carvedilol  6.25 mg Oral BID WC  . enoxaparin (LOVENOX) injection  40 mg Subcutaneous Q24H  . escitalopram  10 mg Oral Daily  . feeding supplement (GLUCERNA SHAKE)  237 mL Oral TID BM  . [START ON 09/14/2014] furosemide  40 mg Intravenous Daily  . insulin aspart  0-15 Units Subcutaneous TID WC  . insulin aspart  0-5 Units Subcutaneous QHS  . insulin detemir  15 Units Subcutaneous Daily  . isosorbide-hydrALAZINE  1 tablet Oral TID  . nicotine  21 mg Transdermal Daily  . potassium chloride  40 mEq Oral Daily   Continuous Infusions: . sodium chloride 10 mL/hr at 09/10/14 1812    Principal Problem:   Respiratory failure with hypoxia Active Problems:   Essential hypertension   Pleural effusion, right   Diabetes mellitus type 2, controlled, without complications   Anemia   Acute renal failure   Protein-calorie malnutrition, severe   Acute systolic congestive heart failure    Time spent: 30 min    Krissy Orebaugh, [facility] Pager [phone]. If 7PM-7AM, please contact night-coverage at [email or web address], password [facility] 09/13/2014, 1:47 PM  LOS: 3 days

## 2014-09-13 NOTE — Evaluation (Signed)
Occupational Therapy Evaluation Patient Details Name: Ralph [name] MRN: [id number] DOB: 09/25/1951 Today's Date: 09/13/2014    History of Present Illness 63 yo came to ED with reports of increased weakness, BLE swelling, not feeling well, and SOB. Tests show B pleural effusion (R>L) with respiratory failure, DM2, Acute renal failure, and biopsy on 09/12/2014 showed lymph node is completely replaced by metastatic adenocarcinoma per oncology hospital note.    Clinical Impression   Pt admitted with increased weakness. Pt currently with functional limitations due to the deficits listed below (see OT Problem List).  Pt will benefit from skilled OT to increase their safety and independence with ADL and functional mobility for ADL to facilitate discharge to venue listed below.      Follow Up Recommendations  SNF    Equipment Recommendations  None recommended by OT       Precautions / Restrictions Precautions Precautions: Fall (pt reports falling at home some due to L leg giving way due to his "back : injury) Restrictions Weight Bearing Restrictions: No      Mobility Bed Mobility               General bed mobility comments: pt was up in chair  Transfers Overall transfer level: Needs assistance Equipment used: Rolling walker (2 wheeled) Transfers: Sit to/from Stand Sit to Stand: Mod assist         General transfer comment: pt plopped 3 tiimes with OT despite VC to reach back and control descent.           ADL Overall ADL's : Needs assistance/impaired Eating/Feeding: Minimal assistance;Sitting   Grooming: Set up;Sitting       Lower Body Bathing: Sit to/from stand;Maximal assistance   Upper Body Dressing : Set up;Minimal assistance   Lower Body Dressing: Maximal assistance;Sit to/from stand   Toilet Transfer: Moderate assistance;RW Toilet Transfer Details (indicate cue type and reason): sit to stand only Toileting- Water quality scientist and Hygiene: Sit  to/from stand;Maximal assistance         General ADL Comments: Post UE testing- pt complained of LUE being asleep. OT did note a few clumsy type movements- but seemed to resolve .  Reported to RN .  Pt did stated this had not happened before- he said "that was weird"               Pertinent Vitals/Pain Pain Assessment: No/denies pain     Hand Dominance  right   Extremity/Trunk Assessment Upper Extremity Assessment Upper Extremity Assessment:  (pt complained of L hand being asleep after UE testing- but woke up)   Lower Extremity Assessment Lower Extremity Assessment: Defer to PT evaluation       Communication Communication Communication: No difficulties   Cognition Arousal/Alertness: Awake/alert Behavior During Therapy: WFL for tasks assessed/performed Overall Cognitive Status: Within Functional Limits for tasks assessed                     General Comments   Shared with RN pt felt like LUE was asleep during OT eval. Pt stated it felt asleep - but resolved.              Home Living Family/patient expects to be discharged to:: Private residence Living Arrangements: Alone Available Help at Discharge: Family (niece brings groceries occassionally) Type of Home: House Home Access: Ramped entrance     Bude: One level     Bathroom Shower/Tub: Teacher, early years/pre: Standard  Home Equipment: Kasandra Knudsen - single point;Cane - quad;Walker - 2 wheels          Prior Functioning/Environment Level of Independence: Independent with assistive device(s)        Comments: pt has to use cane or RW at home since back injury due to the back in his back and down both legs.    OT Diagnosis: Generalized weakness   OT Problem List: Decreased strength;Decreased activity tolerance   OT Treatment/Interventions: Self-care/ADL training;DME and/or AE instruction;Patient/family education    OT Goals(Current goals can be found in the care plan section)  Acute Rehab OT Goals Patient Stated Goal: get back to my life OT Goal Formulation: With patient Time For Goal Achievement: 09/27/14 Potential to Achieve Goals: Good ADL Goals Pt Will Perform Grooming: with set-up;sitting Pt Will Transfer to Toilet: with min guard assist;ambulating;regular height toilet Pt Will Perform Toileting - Clothing Manipulation and hygiene: with min guard assist;sit to/from stand  OT Frequency: Min 2X/week   Barriers to D/C: Decreased caregiver support             End of Session Equipment Utilized During Treatment: Rolling walker;Oxygen  Activity Tolerance: Patient tolerated treatment well Patient left: in chair;with call bell/phone within reach   Time: 0762-2633 OT Time Calculation (min): 22 min Charges:  OT General Charges $OT Visit: 1 Procedure OT Evaluation $Initial OT Evaluation Tier I: 1 Procedure OT Treatments $Self Care/Home Management : 8-22 mins G-Codes:    Payton Mccallum D 2014/10/01, 1:58 PM

## 2014-09-13 NOTE — Progress Notes (Addendum)
VASCULAR LAB PRELIMINARY  ARTERIAL  ABI completed:  Right ABI in the moderate loss of flow range.  Left ABI within normal limits.  Duplex imaging:  Right:  Occluded femoral artery from origin to the mid/distal thigh.  No other areas of significant stenosis.  Left:  Mild smooth plaque to the mid/distal femoral artery where it becomes more irregular and mixed.  No areas of significant stenosis.      RIGHT    LEFT    PRESSURE WAVEFORM  PRESSURE WAVEFORM  BRACHIAL 125 Triphasic  BRACHIAL 138 Triphasic   DP  absent DP 146 Biphasic   AT   AT    PT 81 Monophasic PT 158 Triphasic   PER   PER    GREAT TOE  NA GREAT TOE  NA    RIGHT LEFT  ABI 0.58 1.14     Ralph [name], RVT 09/13/2014, 2:18 PM

## 2014-09-13 NOTE — Evaluation (Signed)
Physical Therapy Evaluation Patient Details Name: Ralph [name] MRN: [id number] DOB: 04-19-1951 Today's Date: 09/13/2014   History of Present Illness  63 yo came to ED with reports of increased weakness, BLE swelling, not feeling well, and SOB. Tests show B pleural effusion (R>L) with respiratory failure, DM2, Acute renal failure, and biopsy on 09/12/2014 showed lymph node is completely replaced by metastatic adenocarcinoma per oncology hospital note.   Clinical Impression  Pt with weakness in general and also has weakness greater on LLE (hip flexion and knee extension) than right. Pt states this has been since his back injury. When we stood and walked he has increased pain in Low back area and down both legs, as well as SOB and fatigue greatly after 15 feet. Pt to benefit from continued PT to increase ability to mobilize safely.     Follow Up Recommendations SNF    Equipment Recommendations  None recommended by PT    Recommendations for Other Services       Precautions / Restrictions Precautions Precautions: Fall (pt reports falling at home some due to L leg giving way due to his "back : injury) Restrictions Weight Bearing Restrictions: No      Mobility  Bed Mobility Overal bed mobility: Needs Assistance Bed Mobility: Supine to Sit;Sit to Supine     Supine to sit: Min guard Sit to supine: Min guard   General bed mobility comments: pt was up in chair  Transfers Overall transfer level: Needs assistance Equipment used: Rolling walker (2 wheeled) Transfers: Sit to/from Stand Sit to Stand: Min assist         General transfer comment: pt fatigued at end of limited wal with decreased control on the descent  Ambulation/Gait Ambulation/Gait assistance: Min guard Ambulation Distance (Feet): 15 Feet (stayed in room walked this short distance on RA ) Assistive device: Rolling walker (2 wheeled) Gait Pattern/deviations: Step-through pattern;Narrow base of support;Decreased  step length - left;Decreased step length - right     General Gait Details: pt got SOB and fatigued very quickly with this short walk in room . prior pt was on 3 L O2 at 92%, then with short walk in room down to 86% on RA with SOB as well. Recovered back to 95% with 3 L and pursed lip breathing education within 30 seconds.   Stairs            Wheelchair Mobility    Modified Rankin (Stroke Patients Only)       Balance                                             Pertinent Vitals/Pain Pain Assessment: 0-10 Pain Score: 3  Pain Location: in R neck area where they did the biopsy yesterday Pain Descriptors / Indicators: Aching Pain Intervention(s): Limited activity within patient's tolerance    Home Living Family/patient expects to be discharged to:: Private residence Living Arrangements: Alone Available Help at Discharge: Family (niece brings groceries occassionally) Type of Home: House Home Access: Ramped entrance     Home Layout: One level Home Equipment: Cane - single point;Cane - quad;Walker - 2 wheels      Prior Function Level of Independence: Independent with assistive device(s)         Comments: pt has to use cane or RW at home since back injury due to the back in his back and  down both legs.     Hand Dominance        Extremity/Trunk Assessment   Upper Extremity Assessment:  (pt complained of L hand being asleep after UE testing- but woke up)           Lower Extremity Assessment: LLE deficits/detail;RLE deficits/detail RLE Deficits / Details: decreassed DF not to rull range for muscle activity LLE Deficits / Details: decreased hip flexion 2+/5, adn knee extension 2+/5. Pt states that side has been weak due to his back.      Communication   Communication: No difficulties  Cognition Arousal/Alertness: Awake/alert Behavior During Therapy: WFL for tasks assessed/performed Overall Cognitive Status: Within Functional Limits for  tasks assessed                      General Comments      Exercises        Assessment/Plan    PT Assessment Patient needs continued PT services  PT Diagnosis Difficulty walking;Generalized weakness   PT Problem List Decreased strength;Decreased activity tolerance;Decreased mobility;Decreased knowledge of use of DME  PT Treatment Interventions Gait training;Functional mobility training;Therapeutic activities;Therapeutic exercise;Patient/family education   PT Goals (Current goals can be found in the Care Plan section) Acute Rehab PT Goals Patient Stated Goal: get back home  PT Goal Formulation: With patient Time For Goal Achievement: 09/27/14 Potential to Achieve Goals: Good    Frequency Min 3X/week   Barriers to discharge        Co-evaluation               End of Session Equipment Utilized During Treatment: Gait belt Activity Tolerance: Patient tolerated treatment well (pt mtoivated, just wanting to know all the why's of why he is so weak and withthese issues. ) Patient left: in bed;with nursing/sitter in room;with bed alarm set Nurse Communication: Mobility status         Time: 1201-1229 PT Time Calculation (min): 28 min   Charges:   PT Evaluation $Initial PT Evaluation Tier I: 1 Procedure PT Treatments $Gait Training: 8-22 mins $Therapeutic Activity: 8-22 mins   PT G CodesClide Dales 09/13/2014, 2:06 PM Clide Dales, PT Pager: [phone] 09/13/2014

## 2014-09-13 NOTE — Consult Note (Signed)
[facility]  Telephone:(336) [place]                                MR#: [id number]  DOB: 1951/05/14                       CSN#: [id number]  Referring MD: Dr. Sheliah Plane [facility]  Primary MD: None  Reason for Consult: Lung Cancer   FHL:[id number] Ralph [name] is a 63 y.o. African American  male smoker asked to see for evaluation of lung cancer.  He was admitted with 2-3 days history of progressive dyspnea, dry cough, and generalized malaise. He denied any hemoptysis.  He denied any nausea, vomiting or diarrhea. He has decreased oral intake and has lost unknown amount of weight. No dysuria or gross hematuria.  He denied any headaches or confusion. No acute motor or sensory deficits.  A CT angio of the chest with contrast on 10/25 revealed extensive thoracic and upper abdominal adenopathy, a questionable nodule on the left lower lobe and R>L bilateral  pleural effusions. No PE was seen. CT abdomen and pelvis with contrast on 10/26 showed diffuse lymphadenopathy throughout the abdomen suspicious for malignancy. The largest component of this is to the left of the celiac axis measuring 3.7 x 4.1 cm. He was diagnosed with hypoxic respiratory failure and was admitted for further management.  He underwent US guided thoracentesis on 10/26 yielding 1.7 l of hazy yellow pleural fluid. Ctytology [id number]) shows malignant cells consistent with metastatic adenocarcinoma. LDH in fluid s 187.  He underwent right supraclavicular lymph node biopsy on 10/27. Results show  [id number] the lymph node is completely replaced by metastatic adenocarcinoma. Immunostains are pending.             PMH:  Past Medical History  Diagnosis Date  . Allergy   . Anxiety     followed [facility]ry every six months.  . Hypertension   . 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 Acute renal failure this  admission.   Surgeries:  Past Surgical History  Procedure Laterality Date  . Rotator cuff surgery      Right    Allergies: No Known Allergies  Medications:   Scheduled Meds: . carvedilol  6.25 mg Oral BID WC  . enoxaparin (LOVENOX) injection  40 mg Subcutaneous Q24H  . escitalopram  10 mg Oral Daily  . feeding supplement (GLUCERNA SHAKE)  237 mL Oral TID BM  . [START ON 09/14/2014] furosemide  40 mg Intravenous Daily  . insulin aspart  0-15 Units Subcutaneous TID WC  . insulin aspart  0-5 Units Subcutaneous QHS  . insulin detemir  15 Units Subcutaneous Daily  . isosorbide-hydrALAZINE  1 tablet Oral TID  . nicotine  21 mg Transdermal Daily  . potassium chloride  40 mEq Oral Daily   Continuous Infusions: . sodium chloride 10 mL/hr at 09/10/14 1812   PRN Meds:.acetaminophen, acetaminophen, clonazePAM, ipratropium, levalbuterol, metoprolol, ondansetron (ZOFRAN) IV, ondansetron  ROS: Constitutional: Denies fevers, chills or abnormal night sweats Eyes: Denies blurriness of vision, double vision or watery eyes Ears, nose, mouth, throat, and face: Denies mucositis or sore throat Respiratory: see HPI Cardiovascular: Denies palpitation, chest discomfort or lower extremity swelling Gastrointestinal:  Denies nausea, heartburn or change in bowel habits Skin: Denies abnormal  skin rashes Lymphatics: Denies new lymphadenopathy or easy bruising Neurological: Denies numbness, tingling or new weaknesses Behavioral/Psych: Mood is stable, no new changes  All other systems were reviewed with the patient and are negative.   Family History:    Family History  Problem Relation Age of Onset  . Heart disease Mother     AMI  . Cancer Sister     breast  . Heart disease Brother     AMI  . Cancer Brother     No family history of hematological  disorders.  Social History:  reports that he has been smoking Cigarettes.  He has a 11.25 pack-year smoking history. He does not have any  smokeless tobacco history on file. He reports that he does not drink alcohol or use illicit drugs. He is a Geophysicist/field seismologist for [employer].Divorced, 2 children. Lives in [place].    Physical Exam   ECOG PERFORMANCE STATUS: 1 Symptomatic but completely ambulatory (Restricted in physically strenuous activity but ambulatory and able to carry out work of a light or sedentary nature. For example, light     Filed Vitals:   09/13/14 0459  BP: 138/67  Pulse: 84  Temp: 98 F (36.7 C)  Resp: 20   Filed Weights   09/10/14 1801 09/13/14 0459  Weight: 180 lb (81.647 kg) 183 lb 12.8 oz (83.371 kg)    GENERAL:alert, no distress and comfortable SKIN: skin color, texture, turgor are normal, no rashes or significant lesions EYES: normal, conjunctiva are pink and non-injected, sclera clear OROPHARYNX:no exudate, no erythema and lips, buccal mucosa, and tongue normal  NECK: supple, thyroid normal size, non-tender, without nodularity LYMPH:  no palpable lymphadenopathy in the cervical, axillary or inguinal LUNGS: decreased breath sounds at the bases. HEART: regular rate & rhythm and no murmurs and trace bilateral lower extremity edema ABDOMEN:abdomen soft, non-tender and normal bowel sounds Musculoskeletal:no cyanosis of digits and no clubbing  PSYCH: alert & oriented x 3 with fluent speech NEURO: no focal motor/sensory deficits  CBC  Recent Labs Lab 09/10/14 1223 09/10/14 1803 09/11/14 0400 09/12/14 0620 09/13/14 0325  WBC 10.2 9.3 9.6 12.3* 9.5  HGB 10.9* 10.5* 10.0* 10.8* 9.1*  HCT 31.7* 30.9* 28.8* 32.4* 26.8*  PLT 175 170 190 217 165  MCV 82.3 82.2 83.5 82.9 85.6  MCH 28.3 27.9 29.0 27.6 29.1  MCHC 34.4 34.0 34.7 33.3 34.0  RDW 17.0* 17.0* 17.2* 17.3* 17.5*  LYMPHSABS 2.0 2.1  --   --   --   MONOABS 0.4 0.5  --   --   --   EOSABS 0.2 0.3  --   --   --   BASOSABS 0.0 0.0  --   --   --     Anemia panel:   Recent Labs  09/11/14 1239  VITAMINB12 623  FERRITIN 492*  TIBC 244  IRON  49    CMP    Recent Labs Lab 09/10/14 1223 09/10/14 1803 09/11/14 0400 09/12/14 0620 09/13/14 0325  NA 143 139 143 142 143  K 3.8 3.6* 3.3* 3.7 4.0  CL 103 101 104 104 105  CO2 27 27 28 27 26   GLUCOSE 231* 248* 214* 173* 124*  BUN 23 22 23  27* 31*  CREATININE 1.49* 1.44* 1.44* 1.90* 2.63*  CALCIUM 8.1* 8.1* 7.9* 8.2* 7.8*  MG  --  1.7  --   --   --   AST 22 19 17   --   --   ALT 12 12 12   --   --  ALKPHOS 103 102 98  --   --   BILITOT 0.3 0.3 0.3  --   --         Component Value Date/Time   BILITOT 0.3 09/11/2014 0400      Recent Labs Lab 09/10/14 1803  INR 1.25    No results found for this basename: DDIMER,  in the last 72 hours  Imaging Studies:  Ct Angio Chest Pe W/cm &/or Wo Cm  09/10/2014  COMPARISON:  None  FINDINGS: Scattered atherosclerotic calcifications aorta and coronary arteries.  Aorta normal caliber.  Asymmetric gynecomastia slightly greater on LEFT.  Pulmonary arteries patent.  No evidence of pulmonary embolism.  BILATERAL pleural effusions moderate LEFT and large RIGHT.  Minimal pericardial effusion.  Extensive adenopathy including:  RIGHT peritracheal node 26 mm short axis image 38  Precarinal nodes 20 mm and 15 mm image 45.  Subcarinal node 26 mm image 54.  LEFT hilar 14 mm image 56.  RIGHT supraclavicular 21 mm image 6  Retrocrural 21 mm image 90  Gastrohepatic ligament 37 mm image 103  Additional scattered upper abdominal, mediastinal and inferior cervical lymph nodes bilaterally.  Questionable focal nodule in LEFT lower lobe 19 mm image 70.  Significant compressive atelectasis of RIGHT greater than LEFT lungs.  Emphysematous changes at apices.  Narrowing of LEFT upper and LEFT lower lobe bronchi by hilar adenopathy.  No additional mass, nodule or infiltrate.  Minimal displacement of trachea to the LEFT by paratracheal adenopathy.  Slightly heterogeneous density of the thoracic vertebrae without focal lesion.  Review of the MIP images confirms the  above findings.  IMPRESSION: No evidence of pulmonary embolism.  Extensive thoracic and upper abdominal adenopathy.  Questionable nodule versus atelectasis in LEFT lower lobe.  BILATERAL pleural effusions RIGHT greater than LEFT with associated atelectasis.  Findings may represent metastatic disease or lymphoma.  Findings called to Dr. Mathis Bud on 09/10/2014 at 1345 hr.   Electronically Signed   By: Lavonia Dana M.D.   On: 09/10/2014 13:45   Ct Abdomen Pelvis W Contrast  09/11/2014   COMPARISON:  09/10/2014  FINDINGS: Lung bases again demonstrate lower lobe consolidation with bilateral pleural effusions right greater than left. The overall appearance is similar to that seen on the prior CT examination. Considerable subcarinal and hilar adenopathy is identified. A mild pericardial effusion is noted.  The liver shows no focal mass lesion. Some mild periportal edema is noted. The gallbladder show some surrounding pericholecystic fluid likely related to mild ascites. No definitive cholelithiasis is seen. The spleen is within normal limits. The adrenal glands appear within normal limits as does the pancreas.  The kidneys show a normal enhancement pattern bilaterally. No renal calculi or obstructive changes are seen. Delayed images demonstrate normal excretion of contrast material.  Multifocal lymphadenopathy is identified throughout the abdomen. Retrocrural nodes are again noted on the right similar to that seen on recent CT. Surrounding the celiac axis in the gastrohepatic ligament, there is a large conglomeration of lymph nodes. The largest component of this is to the left of the celiac axis measuring 3.7 x 4.1 cm best seen on image number 30 of series 2. This extends into the porta hepatis. Significant periaortic, pericaval and intra-aortic caval adenopathy is noted. The largest of these nodes lies in the left periaortic space measuring 2.4 cm in short axis. Portacaval adenopathy is noted as well. This measures 2.5  cm in short axis. Mild inguinal lymphadenopathy is noted. The iliac chains appear clear aortoiliac calcifications  are noted. The bony structures show degenerative change of the lumbar spine. No definitive osteolytic or osteoblastic lesions are seen.  The appendix is not well visualized although no inflammatory changes are noted. Scattered diverticular change of the colon is seen.  IMPRESSION: Bilateral pleural effusions with bilateral lower lobe consolidation.  Diffuse lymphadenopathy throughout the abdomen as described. Again these changes may represent metastatic disease or diffuse lymphoma. Tissue sampling may be helpful.  Mild ascites.   Electronically Signed   By: Inez Catalina M.D.   On: 09/11/2014 10:18   US Thoracentesis Asp Pleural Space W/img Guide  09/11/2014   IMPRESSION: Successful ultrasound guided bright thoracentesis yielding 1.7 L of pleural fluid.  Read by: Ascencion Dike PA-C   Electronically Signed   By: Aletta Edouard M.D.   On: 09/11/2014 10:56    Patient: Ralph [name], Ralph [name] Collected: 09/10/2014 Client: [facility] Accession: [id number] Received: 09/11/2014 Ascencion Dike, PA-C DOB: [birth year] Age: 66 Gender: M Reported: 09/13/2014 [address] Patient Ph: [phone] MRN#: [id number] [place], [place] [address] Client Acc#: Chart: Phone: [phone] Fax: LMP: Visit#: [id number] CC: Leisa Lenz, MD Janece Canterbury, MD CYTOPATHOLOGY REPORT Adequacy Reason Satisfactory For Evaluation. Diagnosis PLEURAL FLUID, RIGHT(SPECIMEN 1 OF 1 COLLECTED 09/11/14): MALIGNANT CELLS CONSISTENT WITH METASTATIC ADENOCARCINOMA. Claudette Laws MD Pathologist, Electronic Signature   Accession: [id number] Received: 09/12/2014 Odis Hollingshead, MD DOB: Apr 21, 1951 Age: 45 Gender: M Reported: 09/13/2014 [address] Patient Ph: [phone] MRN #: [id number] [place], [place] [address] Visit #: [id number] Chart #: Phone: [phone] Fax: CC: REPORT OF SURGICAL PATHOLOGY FINAL  DIAGNOSIS Diagnosis Lymph node for lymphoma, right supraclavicular METASTATIC ADENOCARCINOMA. Microscopic Comment The lymph node is completely replaced by metastatic adenocarcinoma. Immunostains will be performed and an addendum will follow. Gretel Acre LI MD Pathologist, Electronic Signature (Case signed 09/13/2014   A/P: 63 y.o. male with   Metastatic Adenocarcinoma CT angio chest ruled out pulmonary embolism but showed extensive thoracic and upper abdominal adenopathy questionable nodule in LEFT lower lobe, bialteral pleural effusions greater on the right Likely of lung primary, based on right supraclavicular biopsy and right thoracentesis results Consider MRI brain to rule out metastatic disease Dr. Marin Olp to discuss treatment options later today.   History of tobacco abuse Tobacco cessation recommended Continue nicotine patch  Anemia Likely of chronic disease. No bleeding issues are noted No transfusion required at this time  DVT prophylaxis On Lovenox  Acute renal failure Likely due to dehydration and hypertension, appreciate IM follow up.  Malnutrition Consider dietitian evaluation  Full Code  Rondel Jumbo, PA-C 09/13/2014 12:15 PM   ADDENDUM:  I saw and examined the patient. The pathology is metastatic adenocarcinoma. I have to believe that this is primary lung cancer. The only possibility which should be unlikely but much more promising would be metastatic prostate cancer. I will order a PSA on him. I will also order a CEA.  A real problem that we have is his cardiac function. He has moderate cardiomyopathy. His ejection fraction is noninfected at 20-25%.  He has moderate renal insufficiency.  It might be difficult to treat him with systemic chemotherapy. If this were metastatic prostate cancer, we can just use hormonal therapy which I'm sure would give [place] a good response.  I spoke to pathology. I told them to send the specimen off for genetic mutation  analysis for metastatic lung cancer.  I will need to check a prealbumin on him. One would have to think that this is going to be low. He  says that he has not lost weight but it certainly looks like he has.  I would have to say that his performance status is probably ECOG 2 at best.  I think that a Port-A-Cath is needed. I think a bone scan as needed.  What I find incredibly interesting is that his sister died of metastatic breast cancer at age 80 years old. This is why I want to make sure that we are not overlooking prostate cancer.  He is a little anemic. I suspect that his erythropoietin level will be low. I will check iron studies on him. I would make sure that his stools are checked for blood.  He is a very nice guy. I talked to him at length. He knows that he is not curable. I did not talk to him about timeframe.  We did have a good prayer session. He does have a strong faith.  I appreciate everybody's help.  Pete E.  Ephesians 2:8-9

## 2014-09-13 NOTE — Progress Notes (Signed)
Patient seen and examined.  Agree with PA's note.  

## 2014-09-13 NOTE — Progress Notes (Signed)
Afebrile, VSS Dressing is dry, site is sore. Awaiting path. Diffuse lymphadenopathy s/p Right supraclavicular lymph node biopsy, 09/12/2014, Jackolyn Confer, MD

## 2014-09-13 NOTE — Progress Notes (Signed)
    Subjective:  Denies CP; dyspnea improving   Objective:  Filed Vitals:   09/12/14 1409 09/12/14 1413 09/12/14 2207 09/13/14 0459  BP: 109/62 113/56 142/79 138/67  Pulse: 97  89 84  Temp: 97.8 F (36.6 C)  98.3 F (36.8 C) 98 F (36.7 C)  TempSrc: Oral  Oral Oral  Resp: 18  20 20   Height:      Weight:    183 lb 12.8 oz (83.371 kg)  SpO2: 95%  91% 97%    Intake/Output from previous day:  Intake/Output Summary (Last 24 hours) at 09/13/14 0711 Last data filed at 09/13/14 0509  Gross per 24 hour  Intake   1610 ml  Output    760 ml  Net    850 ml    Physical Exam: Physical exam: Well-developed well-nourished in no acute distress.  Skin is warm and dry.  HEENT is normal.  Neck is supple.  Chest With diminished breath sounds bases bilaterally Cardiovascular exam is regular rate and rhythm.  Abdominal exam nontender or distended. No masses palpated. Extremities show no edema. neuro grossly intact    Lab Results: Basic Metabolic Panel:  Recent Labs  09/10/14 1223 09/10/14 1803  09/12/14 0620 09/13/14 0325  NA 143 139  < > 142 143  K 3.8 3.6*  < > 3.7 4.0  CL 103 101  < > 104 105  CO2 27 27  < > 27 26  GLUCOSE 231* 248*  < > 173* 124*  BUN 23 22  < > 27* 31*  CREATININE 1.49* 1.44*  < > 1.90* 2.63*  CALCIUM 8.1* 8.1*  < > 8.2* 7.8*  MG  --  1.7  --   --   --   PHOS  --  3.6  --   --   --   < > = values in this interval not displayed. CBC:  Recent Labs  09/10/14 1223 09/10/14 1803  09/12/14 0620 09/13/14 0325  WBC 10.2 9.3  < > 12.3* 9.5  NEUTROABS 7.5 6.4  --   --   --   HGB 10.9* 10.5*  < > 10.8* 9.1*  HCT 31.7* 30.9*  < > 32.4* 26.8*  MCV 82.3 82.2  < > 82.9 85.6  PLT 175 170  < > 217 165  < > = values in this interval not displayed. Cardiac Enzymes:  Recent Labs  09/11/14 2030 09/12/14 0035 09/12/14 0620  TROPONINI <0.30 <0.30 <0.30     Assessment/Plan:  1 acute systolic congestive heart failure-there is no pedal edema today.  However he continues to have bilateral pleural effusions on examination. Renal function is worse. Change Lasix to 40 mg daily. Follow renal function closely. 2 cardiomyopathy-etiology unclear. He apparently had a cardiac catheterization recently that was "normal". We need to obtain those results. If indeed he had normal coronary arteries no further ischemia evaluation would be indicated. Continue hydralazine/nitrates. Change metoprolol to carvedilol. Avoid ACE inhibitor for now given progressive renal insufficiency. Note TSH normal. 3 adenopathy-patient is status post biopsy. Await results. 4 acute on chronic renal failure-renal function is worse and may be related to recent contrast load. Decrease Lasix to 40 mg daily and follow. 5 hypertension-blood pressure is improving.  Kirk Ruths 09/13/2014, 7:11 AM

## 2014-09-14 ENCOUNTER — Inpatient Hospital Stay (HOSPITAL_COMMUNITY): Payer: Medicaid Other

## 2014-09-14 DIAGNOSIS — I739 Peripheral vascular disease, unspecified: Secondary | ICD-10-CM

## 2014-09-14 LAB — GLUCOSE, CAPILLARY
GLUCOSE-CAPILLARY: 162 mg/dL — AB (ref 70–99)
GLUCOSE-CAPILLARY: 82 mg/dL (ref 70–99)
Glucose-Capillary: 171 mg/dL — ABNORMAL HIGH (ref 70–99)

## 2014-09-14 LAB — BASIC METABOLIC PANEL
Anion gap: 13 (ref 5–15)
BUN: 35 mg/dL — AB (ref 6–23)
CO2: 25 meq/L (ref 19–32)
Calcium: 8.3 mg/dL — ABNORMAL LOW (ref 8.4–10.5)
Chloride: 102 mEq/L (ref 96–112)
Creatinine, Ser: 2.67 mg/dL — ABNORMAL HIGH (ref 0.50–1.35)
GFR calc Af Amer: 28 mL/min — ABNORMAL LOW (ref >90)
GFR, EST NON AFRICAN AMERICAN: 24 mL/min — AB (ref >90)
GLUCOSE: 123 mg/dL — AB (ref 70–99)
Potassium: 4.4 mEq/L (ref 3.7–5.3)
Sodium: 140 mEq/L (ref 137–147)

## 2014-09-14 LAB — CBC
HEMATOCRIT: 29.3 % — AB (ref 39.0–52.0)
Hemoglobin: 10 g/dL — ABNORMAL LOW (ref 13.0–17.0)
MCH: 28.7 pg (ref 26.0–34.0)
MCHC: 34.1 g/dL (ref 30.0–36.0)
MCV: 84 fL (ref 78.0–100.0)
PLATELETS: 191 10*3/uL (ref 150–400)
RBC: 3.49 MIL/uL — AB (ref 4.22–5.81)
RDW: 17.3 % — ABNORMAL HIGH (ref 11.5–15.5)
WBC: 10.5 10*3/uL (ref 4.0–10.5)

## 2014-09-14 LAB — BODY FLUID CULTURE
Culture: NO GROWTH
GRAM STAIN: NONE SEEN

## 2014-09-14 LAB — PREALBUMIN: Prealbumin: 13.2 mg/dL — ABNORMAL LOW (ref 17.0–34.0)

## 2014-09-14 LAB — CEA: CEA: 4.8 ng/mL (ref 0.0–5.0)

## 2014-09-14 MED ORDER — VANCOMYCIN HCL 10 G IV SOLR
1250.0000 mg | INTRAVENOUS | Status: DC
  Administered 2014-09-15 – 2014-09-16 (×2): 1250 mg via INTRAVENOUS
  Filled 2014-09-14 (×3): qty 1250

## 2014-09-14 MED ORDER — ENOXAPARIN SODIUM 40 MG/0.4ML ~~LOC~~ SOLN: 40.0000 mg | SUBCUTANEOUS | Status: DC

## 2014-09-14 MED ORDER — PANTOPRAZOLE SODIUM 40 MG IV SOLR
40.0000 mg | INTRAVENOUS | Status: DC
  Administered 2014-09-14 – 2014-09-18 (×5): 40 mg via INTRAVENOUS
  Filled 2014-09-14 (×6): qty 40

## 2014-09-14 MED ORDER — SODIUM CHLORIDE 0.9 % IV SOLN
1500.0000 mg | Freq: Once | INTRAVENOUS | Status: AC
  Administered 2014-09-14: 1500 mg via INTRAVENOUS
  Filled 2014-09-14: qty 1500

## 2014-09-14 MED ORDER — INSULIN DETEMIR 100 UNIT/ML ~~LOC~~ SOLN
10.0000 [IU] | Freq: Every day | SUBCUTANEOUS | Status: DC
  Administered 2014-09-15: 10 [IU] via SUBCUTANEOUS
  Filled 2014-09-14 (×2): qty 0.1

## 2014-09-14 MED ORDER — ENOXAPARIN SODIUM 40 MG/0.4ML ~~LOC~~ SOLN
40.0000 mg | Freq: Once | SUBCUTANEOUS | Status: AC
Start: 2014-09-14 — End: 2014-09-14
  Administered 2014-09-14: 40 mg via SUBCUTANEOUS
  Filled 2014-09-14: qty 0.4

## 2014-09-14 MED ORDER — PIPERACILLIN-TAZOBACTAM 3.375 G IVPB
3.3750 g | Freq: Three times a day (TID) | INTRAVENOUS | Status: DC
  Administered 2014-09-14 – 2014-09-19 (×14): 3.375 g via INTRAVENOUS
  Filled 2014-09-14 (×18): qty 50

## 2014-09-14 MED ORDER — CEFAZOLIN SODIUM-DEXTROSE 2-3 GM-% IV SOLR
2.0000 g | INTRAVENOUS | Status: AC
  Filled 2014-09-14: qty 50

## 2014-09-14 MED ORDER — TECHNETIUM TC 99M MEDRONATE IV KIT
22.0000 | PACK | Freq: Once | INTRAVENOUS | Status: AC | PRN
  Administered 2014-09-14: 22 via INTRAVENOUS

## 2014-09-14 NOTE — Progress Notes (Signed)
Clinical Social Work Department BRIEF PSYCHOSOCIAL ASSESSMENT 09/14/2014  Patient:  Ralph [name], Ralph [name]     Account Number:  [id number]     Admit date:  09/10/2014  Clinical Social Worker:  Ulyess Blossom  Date/Time:  09/13/2014 03:00 PM  Referred by:  Physician  Date Referred:  09/13/2014 Referred for  SNF Placement   Other Referral:   Interview type:  Patient Other interview type:    PSYCHOSOCIAL DATA Living Status:  ALONE Admitted from facility:   Level of care:   Primary support name:  Jeneen Rinks Mallicoat/[phone] Primary support relationship to patient:  CHILD, ADULT Degree of support available:   adequate    CURRENT CONCERNS Current Concerns  Post-Acute Placement   Other Concerns:    SOCIAL WORK ASSESSMENT / PLAN CSW received referral for New SNF.    CSW met with pt at bedside. CSW introduced self and explained role. Pt shared that he lives alone and does not have family local. Pt discussed that his family lives near [place], [place]. CSW discussed recommendation for rehab at SNF prior to returning home. Pt agreeable. CSW discussed with pt about self pay status and pt shared that he is meeting with financial counselor re: applying for Medicaid as he has not yet applied. CSW discussed that given that pt has no payer source at this time then multiple counties will have to be explored for placement. Pt expressed understanding.    CSW completed FL2 and initiated SNF search to [place], [place], [place], [place], [facility].    Per chart review, pt new cancer diagnosis and pt treatment plan is not yet outlined. CSW will likely need to re-initiate SNF search once treatment plan clarified. Pt has multiple barriers to placement including payer source and potential need for cancer treatments. Pt will require letter of guarantee for placement.    CSW to continue to follow to explore pt disposition needs.   Assessment/plan status:  Psychosocial Support/Ongoing Assessment  of Needs Other assessment/ plan:   discharge planning   Information/referral to community resources:   [place], [place], [place] SNF referrals    PATIENT'S/FAMILY'S RESPONSE TO PLAN OF CARE: Pt alert and oriented x 4. Pt recognizes need for rehab at SNF following hospitalization. Pt pleasant, but expressed fatigue and eagerness to rest this afternoon.    Alison Murray, MSW, LCSW Clinical Social Work Coverage for [employer], [place]

## 2014-09-14 NOTE — Progress Notes (Signed)
ANTIBIOTIC CONSULT NOTE - INITIAL  Pharmacy Consult for vancomycin/zosyn Indication: rule out pneumonia/HCAP  No Known Allergies  Patient Measurements: Height: 6\' 2"  (188 cm) Weight: 185 lb 4.8 oz (84.052 kg) IBW/kg (Calculated) : 82.2 Adjusted Body Weight:   Vital Signs: Temp: 97.9 F (36.6 C) (10/29 0426) Temp Source: Oral (10/29 0426) BP: 160/84 mmHg (10/29 0426) Pulse Rate: 89 (10/29 0426) Intake/Output from previous day: 10/28 0701 - 10/29 0700 In: 820 [P.O.:820] Out: 450 [Urine:450] Intake/Output from this shift: Total I/O In: -  Out: 225 [Urine:225]  Labs:  Recent Labs  09/12/14 0620 09/12/14 1820 09/13/14 0325 09/14/14 0446  WBC 12.3*  --  9.5 10.5  HGB 10.8*  --  9.1* 10.0*  PLT 217  --  165 191  LABCREA  --  104.27  --   --   CREATININE 1.90*  --  2.63* 2.67*   Estimated Creatinine Clearance: 33.4 ml/min (by C-G formula based on Cr of 2.67). No results found for this basename: VANCOTROUGH, Corlis Leak, VANCORANDOM, GENTTROUGH, GENTPEAK, GENTRANDOM, TOBRATROUGH, TOBRAPEAK, TOBRARND, AMIKACINPEAK, AMIKACINTROU, AMIKACIN,  in the last 72 hours   Microbiology: Recent Results (from the past 720 hour(s))  BODY FLUID CULTURE     Status: None   Collection Time    09/11/14 10:28 AM      Result Value Ref Range Status   Specimen Description PLEURAL RIGHT   Final   Special Requests Immunocompromised   Final   Gram Stain     Final   Value: NO WBC SEEN     NO ORGANISMS SEEN     Performed at [facility]   Culture     Final   Value: NO GROWTH 2 DAYS     Performed at [facility]   Report Status PENDING   Incomplete  SURGICAL PCR SCREEN     Status: Abnormal   Collection Time    09/12/14  8:39 AM      Result Value Ref Range Status   MRSA, PCR NEGATIVE  NEGATIVE Final   Staphylococcus aureus POSITIVE (*) NEGATIVE Final   Comment:            The Xpert SA Assay (FDA     approved for NASAL specimens     in patients over 37 years of age),   is one component of     a comprehensive surveillance     program.  Test performance has     been validated by [employer] for patients greater     than or equal to 48 year old.     It is not intended     to diagnose infection nor to     guide or monitor treatment.   Assessment: 63 YOM presented 10/25 with SOB, fatigue, and not feeling well.  Diagnosed with respiratory failure due to acute heart failure. He is s/p thoracentesis 10/26 (removed 1.7L).  He was found to have diffuse lymphadenopathy s/p lymph node biopsy that reveals adenocarcinoma (suspected primary is lung cancer).  CXR 10/29 reveals probable PNA and patient ordered vancomycin and zosyn per pharmacy for HCAP.    10/29 >>vancomycin  >> 10/29 >>zosyn  >>    Tmax: afebrile WBCs: WNL Renal: SCr worsening (holding lasix), h/o CKD. Normalized CrCl = 44ml/min. UOP decreased  10/26 pleural fluid: NGTD  Goal of Therapy:  Vancomycin trough level 15-20 mcg/ml  Plan:   Vancomycin 1500mg  IV x 1 then 1250mg  IV q24h  Monitor renal fx (SCr  adn UOP) and check levels as indicated.  May need to hold vancomycin and check random levels if SCr worsens.   Zosyn 3.375gm IV q8h over 4h infusion  Doreene Eland, PharmD, BCPS.   Pager: [phone]  09/14/2014,12:22 PM

## 2014-09-14 NOTE — Progress Notes (Signed)
Seen and agree  

## 2014-09-14 NOTE — Progress Notes (Signed)
Afebrile, VSS Adenocarcinoma with multiple metastasis  Site looks good, dressing removed, he can shower from our standpoint and take steri strips off in a few days. We will see again as needed.

## 2014-09-14 NOTE — Consult Note (Signed)
Chief Complaint: Chief Complaint  Patient presents with  . Shortness of Breath  Lung Cancer   Referring Physician(s): [name]  History of Present Illness: Ralph [name] is a 63 y.o. male  Pt with new dx lung cancer Smoker; wt loss R [address] bx with surgery 09/12/14: +metasstatic adenocarcinoma Request made for Port a cath placement per [name] I have reviewed chart- seen and examined pt Scheduled for [facility] today Lovenox held    Past Medical History  Diagnosis Date  . Allergy   . Anxiety     followed [facility] every six months.  . Hypertension   . Diabetes mellitus without complication     Past Surgical History  Procedure Laterality Date  . Rotator cuff surgery      Right  . Lymph node biopsy Right 09/12/2014    Procedure: RIGHT NECK LYMPH NODE BIOPSY;  Surgeon: Jackolyn Confer, MD;  Location: [facility];  Service: General;  Laterality: Right;    Allergies: Review of patient's allergies indicates no known allergies.  Medications: Prior to Admission medications   Medication Sig Start Date End Date Taking? Authorizing Provider  clonazePAM (KLONOPIN) 0.5 MG tablet Take 0.5 mg by mouth 3 (three) times daily as needed for anxiety.    Yes Historical Provider, MD  escitalopram (LEXAPRO) 10 MG tablet Take 10 mg by mouth daily.   Yes Historical Provider, MD  glipiZIDE (GLUCOTROL) 5 MG tablet Take 5 mg by mouth daily before breakfast.   Yes Historical Provider, MD  ibuprofen (ADVIL,MOTRIN) 200 MG tablet Take 600 mg by mouth every 6 (six) hours as needed for mild pain or moderate pain.   Yes Historical Provider, MD  metFORMIN (GLUCOPHAGE) 500 MG tablet Take 500 mg by mouth 2 (two) times daily with a meal.   Yes Historical Provider, MD    Family History  Problem Relation Age of Onset  . Heart disease Mother     AMI  . Cancer Sister     breast  . Heart disease Brother     AMI  . Cancer Brother     History   Social History  . Marital Status: Divorced   Spouse Name: N/A    Number of Children: N/A  . Years of Education: N/A   Social History Main Topics  . Smoking status: Current Every Day Smoker -- 0.75 packs/day for 15 years    Types: Cigarettes  . Smokeless tobacco: None  . Alcohol Use: No  . Drug Use: No  . Sexual Activity: None   Other Topics Concern  . None   Social History Narrative   Marital status: divorced; dating casually      Children: 2 grandchildren      Employment: truck driver x 15 years      Tobacco: 1 ppd      Alcohol:  Weekends      Drugs: none     Review of Systems: A 12 point ROS discussed and pertinent positives are indicated in the HPI above.  All other systems are negative.  Review of Systems  Constitutional: Positive for appetite change and unexpected weight change. Negative for activity change.  Respiratory: Positive for cough and shortness of breath.   Cardiovascular: Negative for chest pain.  Gastrointestinal: Negative for abdominal pain.  Genitourinary: Negative for difficulty urinating.  Musculoskeletal: Negative for back pain.  Psychiatric/Behavioral: Negative for behavioral problems and confusion.    Vital Signs: BP 160/84  Pulse 89  Temp(Src) 97.9 F (36.6 C) (  Oral)  Resp 20  Ht 6\' 2"  (1.88 m)  Wt 84.052 kg (185 lb 4.8 oz)  BMI 23.78 kg/m2  SpO2 99%  Physical Exam  Constitutional: He appears well-developed.  thin  Cardiovascular: Normal rate, regular rhythm and normal heart sounds.   No murmur heard. Pulmonary/Chest: Effort normal and breath sounds normal. He has no wheezes.  Abdominal: Soft. Bowel sounds are normal. There is no tenderness.  Musculoskeletal: Normal range of motion.  Neurological: He is alert.  Skin: Skin is warm and dry.  Psychiatric: He has a normal mood and affect. His behavior is normal. Judgment and thought content normal.    Imaging: Dg Chest 1 View  09/11/2014   CLINICAL DATA:  Post right thoracentesis  EXAM: CHEST - 1 VIEW  COMPARISON:  09/10/2014   FINDINGS: Decreased right pleural effusion following thoracentesis. Small right effusion remains. No pneumothorax. Right lower lobe atelectasis has improved  Small left effusion and left lower lobe atelectasis unchanged. Negative for heart failure. Right peritracheal adenopathy noted  IMPRESSION: Decreased right pleural effusion following thoracentesis. No pneumothorax.   Electronically Signed   By: Franchot Gallo M.D.   On: 09/11/2014 11:01   Dg Chest 2 View (if Patient Has Fever And/or Copd)  09/10/2014   CLINICAL DATA:  Short of breath and burping over the last 2 days. History of hypertension and diabetes. History of smoking.  EXAM: CHEST  2 VIEW  COMPARISON:  03/14/2014.  FINDINGS: There are new moderate right and small left pleural effusions. There is additional lung base opacity likely atelectasis. Infiltrate not excluded. No evidence of pulmonary edema. No pneumothorax.  Cardiac silhouette is normal in size. No mediastinal or hilar masses.  Bony thorax is intact.  IMPRESSION: Moderate right and small left effusions, new from the prior chest radiograph. Associated right greater than left lung base opacity is likely atelectasis. Consider pneumonia in the proper clinical setting. No pulmonary edema.   Electronically Signed   By: Lajean Manes M.D.   On: 09/10/2014 11:52   Ct Angio Chest Pe W/cm &/or Wo Cm  09/10/2014   CLINICAL DATA:  Shortness of breath, increased burping for 2 days, had a cold that feels like it is getting worse, history hypertension, diabetes mellitus, anxiety  EXAM: CT ANGIOGRAPHY CHEST WITH CONTRAST  TECHNIQUE: Multidetector CT imaging of the chest was performed using the standard protocol during bolus administration of intravenous contrast. Multiplanar CT image reconstructions and MIPs were obtained to evaluate the vascular anatomy.  CONTRAST:  119mL OMNIPAQUE IOHEXOL 350 MG/ML SOLN IV  COMPARISON:  None  FINDINGS: Scattered atherosclerotic calcifications aorta and coronary  arteries.  Aorta normal caliber.  Asymmetric gynecomastia slightly greater on LEFT.  Pulmonary arteries patent.  No evidence of pulmonary embolism.  BILATERAL pleural effusions moderate LEFT and large RIGHT.  Minimal pericardial effusion.  Extensive adenopathy including:  RIGHT peritracheal node 26 mm short axis image 38  Precarinal nodes 20 mm and 15 mm image 45.  Subcarinal node 26 mm image 54.  LEFT hilar 14 mm image 56.  RIGHT supraclavicular 21 mm image 6  Retrocrural 21 mm image 90  Gastrohepatic ligament 37 mm image 103  Additional scattered upper abdominal, mediastinal and inferior cervical lymph nodes bilaterally.  Questionable focal nodule in LEFT lower lobe 19 mm image 70.  Significant compressive atelectasis of RIGHT greater than LEFT lungs.  Emphysematous changes at apices.  Narrowing of LEFT upper and LEFT lower lobe bronchi by hilar adenopathy.  No additional mass, nodule or  infiltrate.  Minimal displacement of trachea to the LEFT by paratracheal adenopathy.  Slightly heterogeneous density of the thoracic vertebrae without focal lesion.  Review of the MIP images confirms the above findings.  IMPRESSION: No evidence of pulmonary embolism.  Extensive thoracic and upper abdominal adenopathy.  Questionable nodule versus atelectasis in LEFT lower lobe.  BILATERAL pleural effusions RIGHT greater than LEFT with associated atelectasis.  Findings may represent metastatic disease or lymphoma.  Findings called to Dr. Mathis Bud on 09/10/2014 at 1345 hr.   Electronically Signed   By: Lavonia Dana M.D.   On: 09/10/2014 13:45   Mr Brain Wo Contrast  09/14/2014   CLINICAL DATA:  Metastatic lung cancer.  EXAM: MRI HEAD WITHOUT CONTRAST  TECHNIQUE: Multiplanar, multiecho pulse sequences of the brain and surrounding structures were obtained without intravenous contrast.  COMPARISON:  None.  FINDINGS: Images are mildly degraded by motion artifact. There is a 6 mm focus of restricted diffusion in the right aspect of the  pons. Two punctate foci of increased diffusion weighted signal are present in the left occipital lobe with 1 present in the right occipital lobe and 1 in the left parietal lobe. There is no evidence of intracranial hemorrhage, midline shift, or extra-axial fluid collection. There is mild generalized cerebral atrophy. Remote lacunar infarcts are seen in the right corona radiata and at the posterior aspect of the posterior limb of the right internal capsule. Foci of T2 hyperintensity throughout the subcortical and deep cerebral white matter and pons are nonspecific but compatible with mild to moderate chronic small vessel ischemic disease.  Orbits are unremarkable. Mild right ethmoid air cell mucosal thickening is noted. There is rightward nasal septal deviation and suggestion of prior left maxillary antrostomy. No significant mastoid fluid is seen. Major intracranial vascular flow voids are preserved.  IMPRESSION: 1. Subcentimeter foci of abnormal diffusion weighted signal in the pons, occipital lobes, and left parietal lobe. These may represent small, acute to subacute embolic infarcts, however small metastases are also possible. Follow-up postcontrast brain MRI is recommended when the patient's renal function improves. 2. Mild-to-moderate chronic small vessel ischemic disease and remote lacunar infarcts as above.   Electronically Signed   By: Logan Bores   On: 09/14/2014 08:45   Ct Abdomen Pelvis W Contrast  09/11/2014   CLINICAL DATA:  Weakness, pleural effusions in lymphadenopathy  EXAM: CT ABDOMEN AND PELVIS WITH CONTRAST  TECHNIQUE: Multidetector CT imaging of the abdomen and pelvis was performed using the standard protocol following bolus administration of intravenous contrast.  CONTRAST:  121mL OMNIPAQUE IOHEXOL 300 MG/ML  SOLN  COMPARISON:  09/10/2014  FINDINGS: Lung bases again demonstrate lower lobe consolidation with bilateral pleural effusions right greater than left. The overall appearance is  similar to that seen on the prior CT examination. Considerable subcarinal and hilar adenopathy is identified. A mild pericardial effusion is noted.  The liver shows no focal mass lesion. Some mild periportal edema is noted. The gallbladder show some surrounding pericholecystic fluid likely related to mild ascites. No definitive cholelithiasis is seen. The spleen is within normal limits. The adrenal glands appear within normal limits as does the pancreas.  The kidneys show a normal enhancement pattern bilaterally. No renal calculi or obstructive changes are seen. Delayed images demonstrate normal excretion of contrast material.  Multifocal lymphadenopathy is identified throughout the abdomen. Retrocrural nodes are again noted on the right similar to that seen on recent CT. Surrounding the celiac axis in the gastrohepatic ligament, there is a large conglomeration  of lymph nodes. The largest component of this is to the left of the celiac axis measuring 3.7 x 4.1 cm best seen on image number 30 of series 2. This extends into the porta hepatis. Significant periaortic, pericaval and intra-aortic caval adenopathy is noted. The largest of these nodes lies in the left periaortic space measuring 2.4 cm in short axis. Portacaval adenopathy is noted as well. This measures 2.5 cm in short axis. Mild inguinal lymphadenopathy is noted. The iliac chains appear clear aortoiliac calcifications are noted. The bony structures show degenerative change of the lumbar spine. No definitive osteolytic or osteoblastic lesions are seen.  The appendix is not well visualized although no inflammatory changes are noted. Scattered diverticular change of the colon is seen.  IMPRESSION: Bilateral pleural effusions with bilateral lower lobe consolidation.  Diffuse lymphadenopathy throughout the abdomen as described. Again these changes may represent metastatic disease or diffuse lymphoma. Tissue sampling may be helpful.  Mild ascites.   Electronically  Signed   By: Inez Catalina M.D.   On: 09/11/2014 10:18   US Thoracentesis Asp Pleural Space W/img Guide  09/11/2014   CLINICAL DATA:  Shortness of breath, bilateral pleural effusions right greater than left. Request diagnostic and therapeutic thoracentesis.  EXAM: ULTRASOUND GUIDED RIGHT THORACENTESIS  COMPARISON:  None.  PROCEDURE: An ultrasound guided thoracentesis was thoroughly discussed with the patient and questions answered. The benefits, risks, alternatives and complications were also discussed. The patient understands and wishes to proceed with the procedure. Written consent was obtained.  Ultrasound was performed to localize and mark an adequate pocket of fluid in the right chest. The area was then prepped and draped in the normal sterile fashion. 1% Lidocaine was used for local anesthesia. Under ultrasound guidance, a 6 French Safe-T-Centesis catheter was introduced. Thoracentesis was performed. The catheter was removed and a dressing applied.  COMPLICATIONS: None  FINDINGS: A total of approximately 1.7 L of hazy, yellow fluid was removed. A fluid sample wassent for laboratory analysis.  IMPRESSION: Successful ultrasound guided bright thoracentesis yielding 1.7 L of pleural fluid.  Read by: Ascencion Dike PA-C   Electronically Signed   By: Aletta Edouard M.D.   On: 09/11/2014 10:56    Labs:  CBC:  Recent Labs  09/11/14 0400 09/12/14 0620 09/13/14 0325 09/14/14 0446  WBC 9.6 12.3* 9.5 10.5  HGB 10.0* 10.8* 9.1* 10.0*  HCT 28.8* 32.4* 26.8* 29.3*  PLT 190 217 165 191    COAGS:  Recent Labs  09/10/14 1803  INR 1.25  APTT 34    BMP:  Recent Labs  09/11/14 0400 09/12/14 0620 09/13/14 0325 09/14/14 0446  NA 143 142 143 140  K 3.3* 3.7 4.0 4.4  CL 104 104 105 102  CO2 28 27 26 25   GLUCOSE 214* 173* 124* 123*  BUN 23 27* 31* 35*  CALCIUM 7.9* 8.2* 7.8* 8.3*  CREATININE 1.44* 1.90* 2.63* 2.67*  GFRNONAA 51* 36* 24* 24*  GFRAA 59* 42* 28* 28*    LIVER FUNCTION  TESTS:  Recent Labs  09/10/14 1223 09/10/14 1803 09/11/14 0400  BILITOT 0.3 0.3 0.3  AST 22 19 17   ALT 12 12 12   ALKPHOS 103 102 98  PROT 6.0 5.9* 5.5*  ALBUMIN 2.8* 2.8* 2.6*    TUMOR MARKERS: No results found for this basename: AFPTM, CEA, CA199, CHROMGRNA,  in the last 8760 hours  Assessment and Plan:  Dx Lung Ca Need for PAC per [name] Scheduled now for same Pt and son aware  of procedure benefits and risks and agreeable to proceed Consent signed andin chart  Thank you for this interesting consult.  I greatly enjoyed meeting [employer] and look forward to participating in their care.    I spent a total of 20 minutes face to face in clinical consultation, greater than 50% of which was counseling/coordinating care for [facility] a Cath placement  Signed: Shaquille Janes A 09/14/2014, 9:17 AM

## 2014-09-14 NOTE — Progress Notes (Signed)
Patient ID: Ralph [name], male   DOB: Mar 21, 1951, 63 y.o.   MRN: [id number]    Must reschedule PAC and thora to 10/30. Pt had Nuc Med study with injection of nucleide. Cannot stick pt for several hrs  Now scheduled for PAC and thoracentesis 10/30 Resume diet now Npo after MN Hold Lovenox inj 10/30--see orders  Pt aware

## 2014-09-14 NOTE — Progress Notes (Addendum)
[facility] PROGRESS NOTE  MAKEL MCMANN EML:[id number] DOB: January 14, 1951 DOA: 09/10/2014 PCP: No PCP Per Patient  Brief Summary  63 year old male with past medical history of diabetes (managed with PO meds), anxiety, depression who presented to [facility] ED 09/10/2014 with worsening shortness of breath, dry cough, and leg swelling.  He also reported fatigue and weakness and feeling "really sick".  In ED, BP was 177/95 and oxygen saturation was 89% on room air.  CT angio chest neg for pulmonary embolism but showed extensive thoracic and upper abdominal adenopathy questionable nodule versus atelectasis in LEFT lower lobe, bilateral pleural effusions greater on the right.  He underwent thoracentesis and had a transudative effusion.  Duplex lower extremity was negative for DVT.  ECHO demonstrated EF of 20-25% in patient with no previous history of CAD.  Cardiology was consulted and he is undergoing diuresis.  He had excisional LN biopsy on 10/27 and oncology to consult based on pending pathology.  Assessment/Plan  Acute respiratory failure with hypoxia secondary to large right pleural effusion may be secondary to acute systolic heart failure.  -  Thoracentesis perform by radiology on 10-26 yielding 1.7 L.  -  F/u pleural fluid culture and cytology: with malignant cell consistent with adenocarcinoma. No bacteria growth.  -  ECHO:  Mildly dilated LVF, severely reduced systolic function, EF of 71-21%, diffuse hypokinesis, and grade 1 DD, left atrium moderately dilated, small pericardial effusion -  Ferritin wnl, no globulin gap to suggest MM or amyloid  -  Started on ACEI, but creatinine increased so discontinued -  Started bidil TID -  Continue beta blocker -  Strict I/O and daily weights -  Holding lasix due to worsening renal function.  -  Cardiology consulted for possible ischemic work up given ECG findings (possible previous septal infarct, lateral T-wave inversions) - Patient with SOB, will  repeat Chest X ray to evaluate pleural effusion. Might need repeat thoracentesis.   Metastatic Adenocarcinoma probably from Lung primary.  -Diffuse lymphadenopathy found incidentally on CT of the chest - Patient is reportedly up-to-date on his colon cancer and prostate screening  - CT of the abdomen and pelvis: Diffuse adenopathy, no evidence of mass - LDH 499 H, uric acid 7.2, hepatitis panel neg, HIV NR, beta-2 microglobulin pending, ESR 8 - Appreciate general surgery assistance: Excisional LN biopsy performed on 10/27 -pathology from lymph node biopsy showed adenocarcinoma.  -oncology consulted. Appreciate [name] evaluation. PSA and CEA pending.  -Port cath placement.  -MRI with multiple brain lesion stroke versus metastasis diseases. [name] to follow.   Generalized weakness with progressive failure to thrive  -  PT/OT   Likely severe protein calorie malnutrition. Patient denies that he has had weight loss but his family states he has lost a lot of weight over the last year -  Liberalized diet -  Supplements -  Nutrition consultation  Hypertension, with elevated blood pressures -  Started on metoprolol  -  Add bidil -  Hold ACEI due to AKI  Nonsustained VT - Keep electrolytes within normal limits - On beta blocker.  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 Patient needs to follow up with vascular outpatient. No need for anticoagulation.  ABI; 0.58, right, left 1.14.  duplex ordered.    Diabetes mellitus, hemoglobin A1c was previously around 12, currently 7.6, possibly due to weight loss, CBG mildly elevated despite NPO for procedure -  Diabetic diet -  mod sliding-scale insulin with meals - levemir  Change to 10 units to avoid hypoglycemia in setting of worsening renal function. , follow cbg.   Acute on CKD stage 3 due to diabetes and HTN plus recent IV contrast.  Significant  proteinuria on exam -  Renally dose medications -  Minimize nephrotoxins  -  Urine protein-creatinine  -  lasix on hold today.   Anxiety and depression, continue clonazepam and lexapro  Leukocytosis, patient afebrile.  CXR and UA neg  Normocytic anemia, hgb approximately stable, likely due to chronic disease -  Iron studies wnl, b12 623, folate 1160, TSH 3.62 -  Occult stool if able   Cigarette nicotine abuse and dependence with withdrawal.  Smokes a carton per week -  Smoking cessation counseling -  Nicotine patch  Diet:  diabetic Access:  PIV IVF:  off Proph:  lovenox  Code Status: full Family Communication: patient .  Disposition Plan:  Awaiting improvement of renal function and further recommendation from [name].    Consultants:  IR   General surgery  Oncology Dr. Lona Kettle by phone  Cardiology, Dr. [name]  Procedures:  CXR  CT angio chest  CT ab/p   US thoracentesis on 10/26  Antibiotics:    HPI/Subjective: He relates SOB at times. This morning he had SOB, gasping for air, he dint have oxygen on at that time.  He doesn't feels that he is breathing worse than yesterday.   Objective: Filed Vitals:   09/13/14 1417 09/13/14 2055 09/14/14 0417 09/14/14 0426  BP: 128/72 137/83  160/84  Pulse: 85 85  89  Temp: 98 F (36.7 C) 98.5 F (36.9 C)  97.9 F (36.6 C)  TempSrc: Oral Oral  Oral  Resp: _0 Height:      Weight:   84.052 kg (185 lb 4.8 oz)   SpO2: 98% 99%  99%    Intake/Output Summary (Last 24 hours) at 09/14/14 1115 Last data filed at 09/14/14 0821  Gross per 24 hour  Intake    600 ml  Output    425 ml  Net    175 ml   Filed Weights   09/10/14 1801 09/13/14 0459 09/14/14 0417  Weight: 81.647 kg (180 lb) 83.371 kg (183 lb 12.8 oz) 84.052 kg (185 lb 4.8 oz)    Exam:   General:  Mildly cachectic   HEENT: right neck dressing c/d/i  Cardiovascular:  RRR, nl S1, S2 no mrg  Respiratory:  Decreases breath sound, mild  crackles.   Abdomen:   NABS, soft, NT/ND  MSK:   Normal tone and bulk, 2+ pitting LLE and 1+ pitting RLE edema  Data Reviewed: Basic Metabolic Panel:  Recent Labs Lab 09/10/14 1223 09/10/14 1803 09/11/14 0400 09/12/14 0620 09/13/14 0325 09/14/14 0446  NA 143 139 143 142 143 140  K 3.8 3.6* 3.3* 3.7 4.0 4.4  CL 103 101 104 104 105 102  CO2 _1 GLUCOSE 231* 248* 214* 173* 124* 123*  BUN _2 27* 31* 35*  CREATININE 1.49* 1.44* 1.44* 1.90* 2.63* 2.67*  CALCIUM 8.1* 8.1* 7.9* 8.2* 7.8* 8.3*  MG  --  1.7  --   --   --   --   PHOS  --  3.6  --   --   --   --    Liver Function Tests:  Recent Labs Lab 09/10/14 1223 09/10/14 1803 09/11/14 0400  AST _0 ALT _1 ALKPHOS 103 102 98  BILITOT 0.3 0.3 0.3  PROT 6.0 5.9* 5.5*  ALBUMIN 2.8* 2.8* 2.6*   No results found for this basename: LIPASE, AMYLASE,  in the last 168 hours No results found for this basename: AMMONIA,  in the last 168 hours CBC:  Recent Labs Lab 09/10/14 1223 09/10/14 1803 09/11/14 0400 09/12/14 0620 09/13/14 0325 09/14/14 0446  WBC 10.2 9.3 9.6 12.3* 9.5 10.5  NEUTROABS 7.5 6.4  --   --   --   --   HGB 10.9* 10.5* 10.0* 10.8* 9.1* 10.0*  HCT 31.7* 30.9* 28.8* 32.4* 26.8* 29.3*  MCV 82.3 82.2 83.5 82.9 85.6 84.0  PLT 175 170 190 217 165 191   Cardiac Enzymes:  Recent Labs Lab 09/11/14 2030 09/12/14 0035 09/12/14 0620  TROPONINI <0.30 <0.30 <0.30   BNP (last 3 results)  Recent Labs  09/12/14 0620  PROBNP 8587.0*   CBG:  Recent Labs Lab 09/13/14 0723 09/13/14 1145 09/13/14 1803 09/13/14 2059 09/14/14 0830  GLUCAP 131* 152* 107* 88 162*    Recent Results (from the past 240 hour(s))  BODY FLUID CULTURE     Status: None   Collection Time    09/11/14 10:28 AM      Result Value Ref Range Status   Specimen Description PLEURAL RIGHT   Final   Special Requests Immunocompromised   Final   Gram Stain     Final   Value: NO WBC SEEN     NO ORGANISMS  SEEN     Performed at [facility]   Culture     Final   Value: NO GROWTH 2 DAYS     Performed at [facility]   Report Status PENDING   Incomplete  SURGICAL PCR SCREEN     Status: Abnormal   Collection Time    09/12/14  8:39 AM      Result Value Ref Range Status   MRSA, PCR NEGATIVE  NEGATIVE Final   Staphylococcus aureus POSITIVE (*) NEGATIVE Final   Comment:            The Xpert SA Assay (FDA     approved for NASAL specimens     in patients over 81 years of age),     is one component of     a comprehensive surveillance     program.  Test performance has     been validated by [employer] for patients greater     than or equal to 56 year old.     It is not intended     to diagnose infection nor to     guide or monitor treatment.     Studies: Mr Brain Wo Contrast  09/14/2014   CLINICAL DATA:  Metastatic lung cancer.  EXAM: MRI HEAD WITHOUT CONTRAST  TECHNIQUE: Multiplanar, multiecho pulse sequences of the brain and surrounding structures were obtained without intravenous contrast.  COMPARISON:  None.  FINDINGS: Images are mildly degraded by motion artifact. There is a 6 mm focus of restricted diffusion in the right aspect of the pons. Two punctate foci of increased diffusion weighted signal are present in the left occipital lobe with 1 present in the right occipital lobe and 1 in the left parietal lobe. There is no evidence of intracranial hemorrhage, midline shift, or extra-axial fluid collection. There is mild generalized cerebral atrophy. Remote lacunar infarcts are seen in the right corona  radiata and at the posterior aspect of the posterior limb of the right internal capsule. Foci of T2 hyperintensity throughout the subcortical and deep cerebral white matter and pons are nonspecific but compatible with mild to moderate chronic small vessel ischemic disease.  Orbits are unremarkable. Mild right ethmoid air cell mucosal thickening is noted. There is rightward  nasal septal deviation and suggestion of prior left maxillary antrostomy. No significant mastoid fluid is seen. Major intracranial vascular flow voids are preserved.  IMPRESSION: 1. Subcentimeter foci of abnormal diffusion weighted signal in the pons, occipital lobes, and left parietal lobe. These may represent small, acute to subacute embolic infarcts, however small metastases are also possible. Follow-up postcontrast brain MRI is recommended when the patient's renal function improves. 2. Mild-to-moderate chronic small vessel ischemic disease and remote lacunar infarcts as above.   Electronically Signed   By: Logan Bores   On: 09/14/2014 08:45    Scheduled Meds: . carvedilol  6.25 mg Oral BID WC  .  ceFAZolin (ANCEF) IV  2 g Intravenous On Call  . enoxaparin (LOVENOX) injection  40 mg Subcutaneous Q24H  . escitalopram  10 mg Oral Daily  . feeding supplement (GLUCERNA SHAKE)  237 mL Oral TID BM  . insulin aspart  0-15 Units Subcutaneous TID WC  . insulin aspart  0-5 Units Subcutaneous QHS  . insulin detemir  15 Units Subcutaneous Daily  . isosorbide-hydrALAZINE  1 tablet Oral TID  . nicotine  21 mg Transdermal Daily   Continuous Infusions: . sodium chloride 10 mL/hr at 09/10/14 1812    Principal Problem:   Respiratory failure with hypoxia Active Problems:   Essential hypertension   Pleural effusion, right   Diabetes mellitus type 2, controlled, without complications   Anemia   Acute renal failure   Protein-calorie malnutrition, severe   Acute systolic congestive heart failure    Time spent: 30 min    Jerrard Bradburn, [facility] Pager [phone]. If 7PM-7AM, please contact night-coverage at [email or web address], password [facility] 09/14/2014, 11:15 AM  LOS: 4 days    Chest x ray with worsening infiltrates. Start antibiotics. Discussed with Dr Donnal Moat hold on lasix.  I order thoracentesis.

## 2014-09-14 NOTE — Progress Notes (Addendum)
Clinical Social Work Department CLINICAL SOCIAL WORK PLACEMENT NOTE 09/14/2014  Patient:  Ralph [name], Ralph [name]  Account Number:  [id number] Admit date:  09/10/2014  Clinical Social Worker:  Ulyess Blossom  Date/time:  09/14/2014 09:00 AM  Clinical Social Work is seeking post-discharge placement for this patient at the following level of care:   [place]   (*CSW will update this form in Epic as items are completed)   09/14/2014  Patient/family provided with [place] Department of Clinical Social Work's list of facilities offering this level of care within the geographic area requested by the patient (or if unable, by the patient's family).  09/14/2014  Patient/family informed of their freedom to choose among providers that offer the needed level of care, that participate in Medicare, Medicaid or managed care program needed by the patient, have an available bed and are willing to accept the patient.  09/14/2014  Patient/family informed of [facility]' ownership interest in [facility], as well as of the fact that they are under no obligation to receive care at this facility.  PASARR submitted to EDS on 09/14/2014 PASARR number received on   FL2 transmitted to all facilities in geographic area requested by pt/family on  09/14/2014 FL2 transmitted to all facilities within larger geographic area on   Patient informed that his/her managed care company has contracts with or will negotiate with  certain facilities, including the following:     Patient/family informed of bed offers received:  09/20/2014 Patient chooses bed at  Physician recommends and patient chooses bed at  [facility]  Patient to be transferred to  on  [facility] [place] on 09/21/2014 Patient to be transferred to facility by ambulance Corey Harold) Patient and family notified of transfer on 09/21/2014 Name of family member notified:  Pt notified at bedside. Pt sister, Ralph [name] and other family  members notified at bedside.  The following physician request were entered in Epic:   Additional Comments: Pt will be Letter of [place], MSW, [place] Work [phone]

## 2014-09-14 NOTE — Progress Notes (Signed)
    Subjective:  Denies CP; dyspnea improving   Objective:  Filed Vitals:   09/13/14 1417 09/13/14 2055 09/14/14 0417 09/14/14 0426  BP: 128/72 137/83  160/84  Pulse: 85 85  89  Temp: 98 F (36.7 C) 98.5 F (36.9 C)  97.9 F (36.6 C)  TempSrc: Oral Oral  Oral  Resp: 20 20  20   Height:      Weight:   185 lb 4.8 oz (84.052 kg)   SpO2: 98% 99%  99%    Intake/Output from previous day:  Intake/Output Summary (Last 24 hours) at 09/14/14 0749 Last data filed at 09/13/14 2134  Gross per 24 hour  Intake    820 ml  Output    450 ml  Net    370 ml    Physical Exam: Physical exam: Well-developed well-nourished in no acute distress.  Skin is warm and dry.  HEENT is normal.  Neck is supple.  Chest With diminished breath sounds bases bilaterally Cardiovascular exam is regular rate and rhythm.  Abdominal exam nontender or distended. No masses palpated. Extremities show no edema. neuro grossly intact    Lab Results: Basic Metabolic Panel:  Recent Labs  09/13/14 0325 09/14/14 0446  NA 143 140  K 4.0 4.4  CL 105 102  CO2 26 25  GLUCOSE 124* 123*  BUN 31* 35*  CREATININE 2.63* 2.67*  CALCIUM 7.8* 8.3*   CBC:  Recent Labs  09/13/14 0325 09/14/14 0446  WBC 9.5 10.5  HGB 9.1* 10.0*  HCT 26.8* 29.3*  MCV 85.6 84.0  PLT 165 191   Cardiac Enzymes:  Recent Labs  09/11/14 2030 09/12/14 0035 09/12/14 0620  TROPONINI <0.30 <0.30 <0.30     Assessment/Plan:  1 acute systolic congestive heart failure-there is no pedal edema on exam. Cytology from pleural fluid malignant; renal function worse; DC lasix and follow. 2 cardiomyopathy-etiology unclear. He apparently had a cardiac catheterization recently that was "normal". We need to obtain those results. If indeed he had normal coronary arteries no further ischemia evaluation would be indicated. Continue hydralazine/nitrates. Continue carvedilol. Avoid ACE inhibitor for now given progressive renal insufficiency. Note  TSH normal. 3 adenopathy-biopsy results consistent with metastatic adenocarcinoma; oncology has seen and evaluation in progress. 4 acute on chronic renal failure-renal function is worse and may be related to recent contrast load and overdiuresis. DC lasix and follow 5 hypertension-blood pressure is improving.  Kirk Ruths 09/14/2014, 7:49 AM

## 2014-09-14 NOTE — Progress Notes (Signed)
PT Cancellation Note  Patient Details Name: Ralph [name] MRN: [id number] DOB: 12/01/1950   Cancelled Treatment:     Multiple test and lab done today. Pt tiered.  Pt requested to rest and schedule for thoracentesis tomorrow.  Will check back as schedule permits.   Rica Koyanagi  PTA [facility]  Acute  Rehab Pager      [phone]

## 2014-09-15 ENCOUNTER — Inpatient Hospital Stay (HOSPITAL_COMMUNITY): Payer: Medicaid Other

## 2014-09-15 DIAGNOSIS — R0602 Shortness of breath: Secondary | ICD-10-CM

## 2014-09-15 DIAGNOSIS — C3490 Malignant neoplasm of unspecified part of unspecified bronchus or lung: Secondary | ICD-10-CM

## 2014-09-15 DIAGNOSIS — N189 Chronic kidney disease, unspecified: Secondary | ICD-10-CM

## 2014-09-15 DIAGNOSIS — D649 Anemia, unspecified: Secondary | ICD-10-CM

## 2014-09-15 LAB — BASIC METABOLIC PANEL
Anion gap: 13 (ref 5–15)
BUN: 35 mg/dL — AB (ref 6–23)
CO2: 25 meq/L (ref 19–32)
Calcium: 8.4 mg/dL (ref 8.4–10.5)
Chloride: 103 mEq/L (ref 96–112)
Creatinine, Ser: 2.46 mg/dL — ABNORMAL HIGH (ref 0.50–1.35)
GFR calc Af Amer: 31 mL/min — ABNORMAL LOW (ref >90)
GFR, EST NON AFRICAN AMERICAN: 26 mL/min — AB (ref >90)
GLUCOSE: 72 mg/dL (ref 70–99)
POTASSIUM: 4.2 meq/L (ref 3.7–5.3)
SODIUM: 141 meq/L (ref 137–147)

## 2014-09-15 LAB — CBC
HCT: 29 % — ABNORMAL LOW (ref 39.0–52.0)
Hemoglobin: 9.6 g/dL — ABNORMAL LOW (ref 13.0–17.0)
MCH: 27.3 pg (ref 26.0–34.0)
MCHC: 33.1 g/dL (ref 30.0–36.0)
MCV: 82.4 fL (ref 78.0–100.0)
Platelets: 208 K/uL (ref 150–400)
RBC: 3.52 MIL/uL — ABNORMAL LOW (ref 4.22–5.81)
RDW: 17.2 % — ABNORMAL HIGH (ref 11.5–15.5)
WBC: 9.2 K/uL (ref 4.0–10.5)

## 2014-09-15 LAB — GLUCOSE, CAPILLARY
GLUCOSE-CAPILLARY: 119 mg/dL — AB (ref 70–99)
GLUCOSE-CAPILLARY: 160 mg/dL — AB (ref 70–99)
Glucose-Capillary: 71 mg/dL (ref 70–99)
Glucose-Capillary: 84 mg/dL (ref 70–99)
Glucose-Capillary: 89 mg/dL (ref 70–99)

## 2014-09-15 LAB — PSA: PSA: 3.02 ng/mL (ref <=4.00)

## 2014-09-15 MED ORDER — FENTANYL CITRATE 0.05 MG/ML IJ SOLN
INTRAMUSCULAR | Status: AC
  Filled 2014-09-15: qty 6

## 2014-09-15 MED ORDER — MIDAZOLAM HCL 2 MG/2ML IJ SOLN
INTRAMUSCULAR | Status: AC
  Filled 2014-09-15: qty 6

## 2014-09-15 MED ORDER — ENOXAPARIN SODIUM 40 MG/0.4ML ~~LOC~~ SOLN
40.0000 mg | SUBCUTANEOUS | Status: DC
  Administered 2014-09-16 – 2014-09-21 (×6): 40 mg via SUBCUTANEOUS
  Filled 2014-09-15 (×6): qty 0.4

## 2014-09-15 MED ORDER — FENTANYL CITRATE 0.05 MG/ML IJ SOLN
INTRAMUSCULAR | Status: AC | PRN
  Administered 2014-09-15 (×4): 25 ug via INTRAVENOUS

## 2014-09-15 MED ORDER — MIDAZOLAM HCL 2 MG/2ML IJ SOLN
INTRAMUSCULAR | Status: AC | PRN
  Administered 2014-09-15 (×2): 1 mg via INTRAVENOUS

## 2014-09-15 MED ORDER — LIDOCAINE HCL 1 % IJ SOLN
INTRAMUSCULAR | Status: AC
  Filled 2014-09-15: qty 20

## 2014-09-15 NOTE — Progress Notes (Signed)
PHARMACY CONSULT: Lovenox for VTE prophylaxis   Wt: 84 kg Renal: SCr 2.46 (better today), CrCl ~36 ml/hr  CBC: Hgb 9.6, Platelets WNL   - For PAC placement today per IR  A/P:   As per order comments from IR, hold lovenox today 10/30 for PAC placement and resume Lovenox 40 mg SQ q24h 10/31am.    Monitor  renal function, adjust as needed - pharmacy will follow at distance and intervene if change in renal function (ie CrCl < 5ml/min)  Doreene Eland, PharmD, BCPS.   Pager: [phone] 09/15/2014 8:51 AM

## 2014-09-15 NOTE — Progress Notes (Signed)
[facility] PROGRESS NOTE  Ralph [name] YDX:[id number] DOB: 01/13/1951 DOA: 09/10/2014 PCP: No PCP Per Patient  Brief Summary  63 year old male with past medical history of diabetes (managed with PO meds), anxiety, depression who presented to [facility] ED 09/10/2014 with worsening shortness of breath, dry cough, and leg swelling.  He also reported fatigue and weakness and feeling "really sick".  In ED, BP was 177/95 and oxygen saturation was 89% on room air.  CT angio chest neg for pulmonary embolism but showed extensive thoracic and upper abdominal adenopathy questionable nodule versus atelectasis in LEFT lower lobe, bilateral pleural effusions greater on the right.  He underwent thoracentesis and had a transudative effusion.  Duplex lower extremity was negative for DVT.  ECHO demonstrated EF of 20-25% in patient with no previous history of CAD.  Cardiology was consulted and he is undergoing diuresis.  He had excisional LN biopsy on 10/27 and oncology to consult based on pending pathology.  Assessment/Plan  Acute respiratory failure with hypoxia secondary to large right pleural effusion may be secondary to acute systolic heart failure.  -  Thoracentesis perform by radiology on 10-26 yielding 1.7 L. Repeated thoracentesis 10-30 yielding 1.2 L.   -  pleural fluid culture and cytology: with malignant cell consistent with adenocarcinoma. No bacteria growth.  -  ECHO:  Mildly dilated LVF, severely reduced systolic function, EF of 72-09%, diffuse hypokinesis, and grade 1 DD, left atrium moderately dilated, small pericardial effusion -  Ferritin wnl, no globulin gap to suggest MM or amyloid  -  Started on ACEI, but creatinine increased so discontinued -  Started bidil TID -  Continue beta blocker -  Strict I/O and daily weights -  Holding lasix due to worsening renal function.  -  Cardiology consulted for possible ischemic work up given ECG findings (possible previous septal infarct, lateral T-wave  inversions) Feeling better today, dyspnea improved.    PNA; chest x ray 10-29 with bilateral infiltrates. started on vancomycin and zosyn. Day 2.   Metastatic Adenocarcinoma probably from Lung primary.  -Diffuse lymphadenopathy found incidentally on CT of the chest - Patient is reportedly up-to-date on his colon cancer and prostate screening  - CT of the abdomen and pelvis: Diffuse adenopathy, no evidence of mass - LDH 499 H, uric acid 7.2, hepatitis panel neg, HIV NR, beta-2 microglobulin pending, ESR 8 - Appreciate general surgery assistance: Excisional LN biopsy performed on 10/27 -pathology from lymph node biopsy showed adenocarcinoma.  -oncology consulted. Appreciate [name] evaluation. PSA 3.2 and CEA 4.8.  -Port cath placement 10-30 -MRI with multiple brain lesion stroke versus metastasis diseases. Plan to repeat MRI in the future.  -Plan to start chemo over the weekend.   Generalized weakness with progressive failure to thrive  -  PT/OT   Likely severe protein calorie malnutrition. Patient denies that he has had weight loss but his family states he has lost a lot of weight over the last year -  Liberalized diet -  Supplements -  Nutrition consultation  Hypertension, with elevated blood pressures -  Started on metoprolol  -  Add bidil -  Hold ACEI due to AKI  Nonsustained VT - Keep electrolytes within normal limits - On beta blocker.  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 Patient needs to follow up with vascular outpatient. No need for anticoagulation.  ABI; 0.58, right, left 1.14.  duplex ordered.  Diabetes mellitus, hemoglobin A1c was previously around 12, currently 7.6, possibly due to weight loss, CBG mildly elevated despite NPO for procedure -  Diabetic diet -  mod sliding-scale insulin with meals - levemir  Change to 10 units to avoid hypoglycemia in setting of worsening  renal function. , follow cbg.   Acute on CKD stage 3 due to diabetes and HTN plus recent IV contrast.  Significant proteinuria on exam -  Renally dose medications -  Minimize nephrotoxins  -  Urine protein-creatinine  -  lasix on hold today.  -Cr decrease today.   Anxiety and depression, continue clonazepam and lexapro  Normocytic anemia, hgb approximately stable, likely due to chronic disease -  Iron studies wnl, b12 623, folate 1160, TSH 3.62 -  Occult stool if able   Cigarette nicotine abuse and dependence with withdrawal.  Smokes a carton per week -  Smoking cessation counseling -  Nicotine patch  Diet:  diabetic Access:  PIV IVF:  off Proph:  lovenox  Code Status: full Family Communication: patient .  Disposition Plan:  Awaiting improvement of renal function and further recommendation from [name].    Consultants:  IR   General surgery  Oncology Dr. Lona Kettle by phone  Cardiology, Dr. [name]  Procedures:  CXR  CT angio chest  CT ab/p   US thoracentesis on 10/26  Antibiotics: Vancomycin Zosyn  HPI/Subjective: He is breathing better after thoracentesis. Had some nausea yesterday. Feeling better. No abdominal pain. Feels he will have a BM.   Objective: Filed Vitals:   09/15/14 1149 09/15/14 1214 09/15/14 1305 09/15/14 1349  BP: 102/65 108/62 138/81 158/91  Pulse: 78   83  Temp:    97.6 F (36.4 C)  TempSrc:    Oral  Resp: 13   14  Height:      Weight:      SpO2: 100% 92% 93% 100%    Intake/Output Summary (Last 24 hours) at 09/15/14 1702 Last data filed at 09/15/14 1500  Gross per 24 hour  Intake   1110 ml  Output    725 ml  Net    385 ml   Filed Weights   09/13/14 0459 09/14/14 0417 09/15/14 0541  Weight: 83.371 kg (183 lb 12.8 oz) 84.052 kg (185 lb 4.8 oz) 84.4 kg (186 lb 1.1 oz)    Exam:   General:  Mildly cachectic   HEENT: right neck dressing c/d/i  Cardiovascular:  RRR, nl S1, S2 no mrg  Respiratory:  Decreases  breath sound, mild crackles.   Abdomen:   NABS, soft, NT/ND  MSK:   Normal tone and bulk, 2+ pitting LLE and 1+ pitting RLE edema  Data Reviewed: Basic Metabolic Panel:  Recent Labs Lab 09/10/14 1223 09/10/14 1803 09/11/14 0400 09/12/14 0620 09/13/14 0325 09/14/14 0446 09/15/14 0405  NA 143 139 143 142 143 140 141  K 3.8 3.6* 3.3* 3.7 4.0 4.4 4.2  CL 103 101 104 104 105 102 103  CO2 27 27 28 27 26 25 25   GLUCOSE 231* 248* 214* 173* 124* 123* 72  BUN 23 22 23  27* 31* 35* 35*  CREATININE 1.49* 1.44* 1.44* 1.90* 2.63* 2.67* 2.46*  CALCIUM 8.1* 8.1* 7.9* 8.2* 7.8* 8.3* 8.4  MG  --  1.7  --   --   --   --   --   PHOS  --  3.6  --   --   --   --   --  Liver Function Tests:  Recent Labs Lab 09/10/14 1223 09/10/14 1803 09/11/14 0400  AST 22 19 17   ALT 12 12 12   ALKPHOS 103 102 98  BILITOT 0.3 0.3 0.3  PROT 6.0 5.9* 5.5*  ALBUMIN 2.8* 2.8* 2.6*   No results found for this basename: LIPASE, AMYLASE,  in the last 168 hours No results found for this basename: AMMONIA,  in the last 168 hours CBC:  Recent Labs Lab 09/10/14 1223 09/10/14 1803 09/11/14 0400 09/12/14 0620 09/13/14 0325 09/14/14 0446 09/15/14 0405  WBC 10.2 9.3 9.6 12.3* 9.5 10.5 9.2  NEUTROABS 7.5 6.4  --   --   --   --   --   HGB 10.9* 10.5* 10.0* 10.8* 9.1* 10.0* 9.6*  HCT 31.7* 30.9* 28.8* 32.4* 26.8* 29.3* 29.0*  MCV 82.3 82.2 83.5 82.9 85.6 84.0 82.4  PLT 175 170 190 217 165 191 208   Cardiac Enzymes:  Recent Labs Lab 09/11/14 2030 09/12/14 0035 09/12/14 0620  TROPONINI <0.30 <0.30 <0.30   BNP (last 3 results)  Recent Labs  09/12/14 0620  PROBNP 8587.0*   CBG:  Recent Labs Lab 09/14/14 1622 09/14/14 2141 09/15/14 0743 09/15/14 1359 09/15/14 1627  GLUCAP 82 71 84 119* 160*    Recent Results (from the past 240 hour(s))  BODY FLUID CULTURE     Status: None   Collection Time    09/11/14 10:28 AM      Result Value Ref Range Status   Specimen Description PLEURAL RIGHT    Final   Special Requests Immunocompromised   Final   Gram Stain     Final   Value: NO WBC SEEN     NO ORGANISMS SEEN     Performed at [facility]   Culture     Final   Value: NO GROWTH 3 DAYS     Performed at [facility]   Report Status 09/14/2014 FINAL   Final  SURGICAL PCR SCREEN     Status: Abnormal   Collection Time    09/12/14  8:39 AM      Result Value Ref Range Status   MRSA, PCR NEGATIVE  NEGATIVE Final   Staphylococcus aureus POSITIVE (*) NEGATIVE Final   Comment:            The Xpert SA Assay (FDA     approved for NASAL specimens     in patients over 53 years of age),     is one component of     a comprehensive surveillance     program.  Test performance has     been validated by [employer] for patients greater     than or equal to 89 year old.     It is not intended     to diagnose infection nor to     guide or monitor treatment.  CULTURE, BLOOD (ROUTINE X 2)     Status: None   Collection Time    09/14/14  3:09 PM      Result Value Ref Range Status   Specimen Description BLOOD LEFT ARM   Final   Special Requests BOTTLES DRAWN AEROBIC AND ANAEROBIC  10 CC   Final   Culture  Setup Time     Final   Value: 09/14/2014 22:53     Performed at [facility]   Culture     Final   Value:        BLOOD CULTURE RECEIVED  NO GROWTH TO DATE CULTURE WILL BE HELD FOR 5 DAYS BEFORE ISSUING A FINAL NEGATIVE REPORT     Performed at [facility]   Report Status PENDING   Incomplete  CULTURE, BLOOD (ROUTINE X 2)     Status: None   Collection Time    09/14/14  3:15 PM      Result Value Ref Range Status   Specimen Description BLOOD LEFT ARM   Final   Special Requests BOTTLES DRAWN AEROBIC AND ANAEROBIC  10 CC   Final   Culture  Setup Time     Final   Value: 09/14/2014 22:55     Performed at [facility]   Culture     Final   Value:        BLOOD CULTURE RECEIVED NO GROWTH TO DATE CULTURE WILL BE HELD FOR 5 DAYS BEFORE ISSUING A FINAL  NEGATIVE REPORT     Performed at [facility]   Report Status PENDING   Incomplete     Studies: Dg Chest 1 View  09/15/2014   CLINICAL DATA:  Subsequent encounter for left pleural effusion.  EXAM: CHEST - 1 VIEW  COMPARISON:  09/14/2014.  FINDINGS: 1211 hrs. Moderate left pleural effusion has progressed in the interval. No substantial right pleural effusion. No evidence for pneumothorax. Right Port-A-Cath is new in the interval with tip overlying the distal SVC. Cardiopericardial silhouette is upper limits of normal for size. Mild leftward deviation of the trachea stable.  IMPRESSION: No evidence of pneumothorax status post thoracentesis.   Electronically Signed   By: Misty Stanley M.D.   On: 09/15/2014 13:27   Dg Chest 2 View  09/14/2014   CLINICAL DATA:  Progressive shortness of Breath  EXAM: CHEST  2 VIEW  COMPARISON:  One-view chest 09/11/2014  FINDINGS: Heart is enlarged. Bilateral pleural effusions and and lower lobe airspace disease has increased bilaterally. Increased pulmonary vascular congestion is noted as well. The upper lung fields are clear. The visualized soft tissues and the bony thorax are unremarkable.  IMPRESSION: 1. Progressive bilateral lower lobe airspace disease and effusions compatible with progressive disease and probable pneumonia. 2. Prominence of the hila bilaterally may in part represent pulmonary vascular congestion. Known adenopathy is also present.   Electronically Signed   By: Lawrence Santiago M.D.   On: 09/14/2014 11:49   Mr Brain Wo Contrast  09/14/2014   CLINICAL DATA:  Metastatic lung cancer.  EXAM: MRI HEAD WITHOUT CONTRAST  TECHNIQUE: Multiplanar, multiecho pulse sequences of the brain and surrounding structures were obtained without intravenous contrast.  COMPARISON:  None.  FINDINGS: Images are mildly degraded by motion artifact. There is a 6 mm focus of restricted diffusion in the right aspect of the pons. Two punctate foci of increased diffusion  weighted signal are present in the left occipital lobe with 1 present in the right occipital lobe and 1 in the left parietal lobe. There is no evidence of intracranial hemorrhage, midline shift, or extra-axial fluid collection. There is mild generalized cerebral atrophy. Remote lacunar infarcts are seen in the right corona radiata and at the posterior aspect of the posterior limb of the right internal capsule. Foci of T2 hyperintensity throughout the subcortical and deep cerebral white matter and pons are nonspecific but compatible with mild to moderate chronic small vessel ischemic disease.  Orbits are unremarkable. Mild right ethmoid air cell mucosal thickening is noted. There is rightward nasal septal deviation and suggestion of prior left maxillary antrostomy. No significant  mastoid fluid is seen. Major intracranial vascular flow voids are preserved.  IMPRESSION: 1. Subcentimeter foci of abnormal diffusion weighted signal in the pons, occipital lobes, and left parietal lobe. These may represent small, acute to subacute embolic infarcts, however small metastases are also possible. Follow-up postcontrast brain MRI is recommended when the patient's renal function improves. 2. Mild-to-moderate chronic small vessel ischemic disease and remote lacunar infarcts as above.   Electronically Signed   By: Logan Bores   On: 09/14/2014 08:45   Nm Bone Scan Whole Body  09/14/2014   CLINICAL DATA:  Metastatic lung cancer, history colon cancer, hypertension, diabetes  EXAM: NUCLEAR MEDICINE WHOLE BODY BONE SCAN  TECHNIQUE: Whole body anterior and posterior images were obtained approximately 3 hours after intravenous injection of radiopharmaceutical.  RADIOPHARMACEUTICALS:  22.0 mCi Technetium-70mMDP IV  COMPARISON:  None  Radiographic correlation: CT chest 09/10/2014, CT abdomen and pelvis 09/11/2014, MR brain 09/14/2014  FINDINGS: Poor osseous tracer accumulation question related to decrease bone turnover, question  decreased activity.  Increased soft tissue in urinary tract distribution of tracer.  Uptake at the RIGHT lateral aspect of the lumbar spine at approximately L3, nonspecific ; patient has a single sclerotic focus 7 mm diameter at this approximate position on CT.  Minimal uptake at the shoulders, sternoclavicular joints, elbows, knees, and feet, distribution typically degenerative.  No additional worrisome sites of abnormal tracer accumulation identified to suggest osseous metastatic disease.  IMPRESSION: Single mild focus of increased tracer localization at the RIGHT lateral aspect of the approximately L3 vertebral body, roughly corresponding in position with a sclerotic focus on recent CT; sclerotic metastatic lesion not excluded and evaluation of the lumbar spine by MR imaging recommended.  Scattered degenerative type uptake.  Poor osseous tracer accumulation suggesting decreased activity/poor bone turnover.   Electronically Signed   By: MLavonia DanaM.D.   On: 09/14/2014 14:20   Ir Fluoro Guide Cv Line Right  09/15/2014   CLINICAL DATA:  Metastatic adenocarcinoma presumably of lung primary. The patient requires a porta cath to begin chemotherapy.  EXAM: IMPLANTED PORT A CATH PLACEMENT WITH ULTRASOUND AND FLUOROSCOPIC GUIDANCE  ANESTHESIA/SEDATION: 2.0 Mg IV Versed; 100 mcg IV Fentanyl  Total Moderate Sedation Time:  33 minutes  Additional Medications: 2 g IV Ancef. As antibiotic prophylaxis, Ancef was ordered pre-procedure and administered intravenously within one hour of incision.  FLUOROSCOPY TIME:  12 seconds.  PROCEDURE: The procedure, risks, benefits, and alternatives were explained to the patient. Questions regarding the procedure were encouraged and answered. The patient understands and consents to the procedure.  The right neck and chest were prepped with chlorhexidine in a sterile fashion, and a sterile drape was applied covering the operative field. Maximum barrier sterile technique with sterile gowns  and gloves were used for the procedure. A time-out procedure was performed. Local anesthesia was provided with 1% lidocaine.  Ultrasound was used to confirm patency of the right internal jugular vein. After creating a small venotomy incision, a 21 gauge needle was advanced into the right internal jugular vein under direct, real-time ultrasound guidance. Ultrasound image documentation was performed. After securing guidewire access, an 8 Fr dilator was placed. A J-wire was kinked to measure appropriate catheter length.  A subcutaneous port pocket was then created along the upper chest wall utilizing sharp and blunt dissection. Portable cautery was utilized. The pocket was irrigated with sterile saline.  A single lumen power injectable port was chosen for placement. The 8 Fr catheter was tunneled from the port  pocket site to the venotomy incision. The port was placed in the pocket. External catheter was trimmed to appropriate length based on guidewire measurement.  At the venotomy, an 8 Fr peel-away sheath was placed over a guidewire. The catheter was then placed through the sheath and the sheath removed. Final catheter positioning was confirmed and documented with a fluoroscopic spot image. The port was accessed with a needle and aspirated and flushed with heparinized saline. The needle was removed.  The venotomy and port pocket incisions were closed with subcutaneous 3-0 Monocryl and subcuticular 4-0 Vicryl. Dermabond was applied to both incisions.  COMPLICATIONS: None  FINDINGS: After catheter placement, the tip lies at the cavoatrial junction. The catheter aspirates normally and is ready for immediate use.  IMPRESSION: Placement of single lumen port a cath via right internal jugular vein. The catheter tip lies at the cavoatrial junction. A power injectable port a cath was placed and is ready for immediate use.   Electronically Signed   By: Aletta Edouard M.D.   On: 09/15/2014 12:41   Ir US Guide Vasc Access  Right  09/15/2014   CLINICAL DATA:  Metastatic adenocarcinoma presumably of lung primary. The patient requires a porta cath to begin chemotherapy.  EXAM: IMPLANTED PORT A CATH PLACEMENT WITH ULTRASOUND AND FLUOROSCOPIC GUIDANCE  ANESTHESIA/SEDATION: 2.0 Mg IV Versed; 100 mcg IV Fentanyl  Total Moderate Sedation Time:  33 minutes  Additional Medications: 2 g IV Ancef. As antibiotic prophylaxis, Ancef was ordered pre-procedure and administered intravenously within one hour of incision.  FLUOROSCOPY TIME:  12 seconds.  PROCEDURE: The procedure, risks, benefits, and alternatives were explained to the patient. Questions regarding the procedure were encouraged and answered. The patient understands and consents to the procedure.  The right neck and chest were prepped with chlorhexidine in a sterile fashion, and a sterile drape was applied covering the operative field. Maximum barrier sterile technique with sterile gowns and gloves were used for the procedure. A time-out procedure was performed. Local anesthesia was provided with 1% lidocaine.  Ultrasound was used to confirm patency of the right internal jugular vein. After creating a small venotomy incision, a 21 gauge needle was advanced into the right internal jugular vein under direct, real-time ultrasound guidance. Ultrasound image documentation was performed. After securing guidewire access, an 8 Fr dilator was placed. A J-wire was kinked to measure appropriate catheter length.  A subcutaneous port pocket was then created along the upper chest wall utilizing sharp and blunt dissection. Portable cautery was utilized. The pocket was irrigated with sterile saline.  A single lumen power injectable port was chosen for placement. The 8 Fr catheter was tunneled from the port pocket site to the venotomy incision. The port was placed in the pocket. External catheter was trimmed to appropriate length based on guidewire measurement.  At the venotomy, an 8 Fr peel-away sheath  was placed over a guidewire. The catheter was then placed through the sheath and the sheath removed. Final catheter positioning was confirmed and documented with a fluoroscopic spot image. The port was accessed with a needle and aspirated and flushed with heparinized saline. The needle was removed.  The venotomy and port pocket incisions were closed with subcutaneous 3-0 Monocryl and subcuticular 4-0 Vicryl. Dermabond was applied to both incisions.  COMPLICATIONS: None  FINDINGS: After catheter placement, the tip lies at the cavoatrial junction. The catheter aspirates normally and is ready for immediate use.  IMPRESSION: Placement of single lumen port a cath via right internal jugular vein.  The catheter tip lies at the cavoatrial junction. A power injectable port a cath was placed and is ready for immediate use.   Electronically Signed   By: Aletta Edouard M.D.   On: 09/15/2014 12:41   US Thoracentesis Asp Pleural Space W/img Guide  09/15/2014   INDICATION: Metastatic lung adenocarcinoma, bilateral pleural effusions right greater than left. Request is made for therapeutic right thoracentesis .  EXAM: ULTRASOUND-GUIDED THERAPEUTIC RIGHT THORACENTESIS  COMPARISON:  PRIOR THORACENTESIS ON 09/11/2014  MEDICATIONS: None  COMPLICATIONS: None immediate  TECHNIQUE: Informed written consent was obtained from the patient after a discussion of the risks, benefits and alternatives to treatment. A timeout was performed prior to the initiation of the procedure.  Initial ultrasound scanning demonstrates a moderate to large right pleural effusion. The lower chest was prepped and draped in the usual sterile fashion. 1% lidocaine was used for local anesthesia.  Under direct ultrasound guidance, a 19 gauge, 7-cm, Yueh catheter was introduced. An ultrasound image was saved for documentation purposes. The thoracentesis was performed. The catheter was removed and a dressing was applied. The patient tolerated the procedure well  without immediate post procedural complication. The patient was escorted to have an upright chest radiograph.  FINDINGS: A total of approximately 1.2 liters of turbid, yellow fluid was removed.  IMPRESSION: Successful ultrasound-guided therapeutic right sided thoracentesis yielding 1.2 liters of pleural fluid.  Read by: Rowe Robert, PA-C   Electronically Signed   By: Aletta Edouard M.D.   On: 09/15/2014 13:20    Scheduled Meds: . carvedilol  6.25 mg Oral BID WC  . [START ON 09/16/2014] enoxaparin (LOVENOX) injection  40 mg Subcutaneous Q24H  . escitalopram  10 mg Oral Daily  . feeding supplement (GLUCERNA SHAKE)  237 mL Oral TID BM  . insulin aspart  0-15 Units Subcutaneous TID WC  . insulin aspart  0-5 Units Subcutaneous QHS  . insulin detemir  10 Units Subcutaneous Daily  . isosorbide-hydrALAZINE  1 tablet Oral TID  . lidocaine      . nicotine  21 mg Transdermal Daily  . pantoprazole (PROTONIX) IV  40 mg Intravenous Q24H  . piperacillin-tazobactam (ZOSYN)  IV  3.375 g Intravenous 3 times per day  . vancomycin  1,250 mg Intravenous Q24H   Continuous Infusions: . sodium chloride 10 mL/hr at 09/15/14 1404    Principal Problem:   Respiratory failure with hypoxia Active Problems:   Essential hypertension   Pleural effusion, right   Diabetes mellitus type 2, controlled, without complications   Anemia   Acute renal failure   Protein-calorie malnutrition, severe   Acute systolic congestive heart failure    Time spent: 30 min    Webster Patrone, [facility] Pager [phone]. If 7PM-7AM, please contact night-coverage at [email or web address], password [facility] 09/15/2014, 5:02 PM  LOS: 5 days    Chest x ray with worsening infiltrates. Start antibiotics. Discussed with Dr Donnal Moat hold on lasix.  I order thoracentesis.

## 2014-09-15 NOTE — Progress Notes (Signed)
Occupational Therapy Treatment Patient Details Name: Ralph [name] MRN: [id number] DOB: June 10, 1951 Today's Date: 09/15/2014    History of present illness 63 yo came to ED with reports of increased weakness, BLE swelling, not feeling well, and SOB. Tests show B pleural effusion (R>L) with respiratory failure, DM2, Acute renal failure, and biopsy on 09/12/2014 showed lymph node is completely replaced by metastatic adenocarcinoma per oncology hospital note.    OT comments  Pt with n/v upon sitting up on EOB this morning.  Pt became very clammy and weak feeling.  Nursing aware.  Pt attempted minimal adls before laying back down to go to cath placement.     Follow Up Recommendations  SNF    Equipment Recommendations  None recommended by OT    Recommendations for Other Services      Precautions / Restrictions Precautions Precautions: Fall Restrictions Weight Bearing Restrictions: No       Mobility Bed Mobility Overal bed mobility: Needs Assistance Bed Mobility: Supine to Sit;Sit to Supine     Supine to sit: Min guard Sit to supine: Min guard      Transfers Overall transfer level: Needs assistance Equipment used: Rolling walker (2 wheeled) Transfers: Sit to/from Stand Sit to Stand: Min assist         General transfer comment: pt fatigued at end of limited wal with decreased control on the descent    Balance Overall balance assessment: Needs assistance Sitting-balance support: Feet supported Sitting balance-Leahy Scale: Fair Sitting balance - Comments: Pt unsteady sitting but was not feeling well and in the middle of vomiiting.   Standing balance support: Bilateral upper extremity supported;During functional activity Standing balance-Leahy Scale: Poor Standing balance comment: Pt able to stand but must have outside support to remain standing.                   ADL Overall ADL's : Needs assistance/impaired     Grooming: Set up;Sitting               Lower Body Dressing: Moderate assistance;Sit to/from stand Lower Body Dressing Details (indicate cue type and reason): Pt able to cross legs to donn socks. Pt with n/v today and not up to much. Toilet Transfer: Moderate assistance;RW Toilet Transfer Details (indicate cue type and reason): sit to stand only Toileting- Water quality scientist and Hygiene: Moderate assistance;Sit to/from stand       Functional mobility during ADLs: Minimal assistance General ADL Comments: Pt with n/v today. Not feeling up to much therapy.  Performed toilet transfer and LE dressing before returning to supine to get procedure for catheter placed.      Vision                     Perception     Praxis      Cognition   Behavior During Therapy: WFL for tasks assessed/performed Overall Cognitive Status: Within Functional Limits for tasks assessed                       Extremity/Trunk Assessment               Exercises     Shoulder Instructions       General Comments      Pertinent Vitals/ Pain       Pain Assessment: 0-10 Pain Score: 0-No pain  Home Living  Prior Functioning/Environment              Frequency Min 2X/week     Progress Toward Goals  OT Goals(current goals can now be found in the care plan section)  Progress towards OT goals: Progressing toward goals  Acute Rehab OT Goals Patient Stated Goal: get back home  OT Goal Formulation: With patient Time For Goal Achievement: 09/27/14 Potential to Achieve Goals: Good ADL Goals Pt Will Perform Grooming: with set-up;sitting Pt Will Transfer to Toilet: with min guard assist;ambulating;regular height toilet Pt Will Perform Toileting - Clothing Manipulation and hygiene: with min guard assist;sit to/from stand  Plan Discharge plan remains appropriate    Co-evaluation                 End of Session Equipment Utilized During Treatment:  Rolling walker;Oxygen   Activity Tolerance Patient limited by fatigue   Patient Left in bed;with call bell/phone within reach;Other (comment) (leaving for procedure)   Nurse Communication Mobility status        Time: 3846-6599 OT Time Calculation (min): 23 min  Charges: OT General Charges $OT Visit: 1 Procedure OT Treatments $Self Care/Home Management : 23-37 mins  Glenford Peers 09/15/2014, 10:20 AM [phone]

## 2014-09-15 NOTE — Progress Notes (Signed)
Spoke with pt's daughter about advance directive - healthcare power of attorney.  Daughter explained that pt and pt's son are in agreement that she should serve as HCPOA.  She would like pt to be able to complete POA while at [facility].  Daughter is from [place] and will be going home in near future.    Chaplain explained that pt would need to be alert and coherent to answer questions and sign POA.  Spoke with RN Mable Fill, who will page chaplain service when pt returns from procedure and is able to complete paperwork.    Daughter concerned about other family members being present in room, as she feels this will be stressful and also possibly influence the pt's decision.  Chaplain will need to ensure that pt is choosing HCPOA without undue outside influence.    [name], [name]

## 2014-09-15 NOTE — Progress Notes (Signed)
    Subjective:  Denies CP; mild dyspnea    Objective:  Filed Vitals:   09/14/14 0417 09/14/14 0426 09/14/14 2138 09/15/14 0541  BP:  160/84 135/77 131/67  Pulse:  89 74 97  Temp:  97.9 F (36.6 C) 97.8 F (36.6 C) 99.2 F (37.3 C)  TempSrc:  Oral Oral Oral  Resp:  20 18 18   Height:      Weight: 185 lb 4.8 oz (84.052 kg)   186 lb 1.1 oz (84.4 kg)  SpO2:  99% 95% 90%    Intake/Output from previous day:  Intake/Output Summary (Last 24 hours) at 09/15/14 0734 Last data filed at 09/15/14 0551  Gross per 24 hour  Intake    990 ml  Output    950 ml  Net     40 ml    Physical Exam: Physical exam: Well-developed well-nourished in no acute distress.  Skin is warm and dry.  HEENT is normal.  Neck is supple.  Chest With diminished breath sounds bases bilaterally Cardiovascular exam is regular rate and rhythm.  Abdominal exam nontender or distended. No masses palpated. Extremities show no edema. neuro grossly intact    Lab Results: Basic Metabolic Panel:  Recent Labs  09/14/14 0446 09/15/14 0405  NA 140 141  K 4.4 4.2  CL 102 103  CO2 25 25  GLUCOSE 123* 72  BUN 35* 35*  CREATININE 2.67* 2.46*  CALCIUM 8.3* 8.4   CBC:  Recent Labs  09/14/14 0446 09/15/14 0405  WBC 10.5 9.2  HGB 10.0* 9.6*  HCT 29.3* 29.0*  MCV 84.0 82.4  PLT 191 208     Assessment/Plan:  1 acute systolic congestive heart failure-there is no pedal edema on exam. Cytology from pleural fluid malignant suggesting effusion is at least partially related to malignancy; Renal function unchanged; continue to hold lasix. 2 cardiomyopathy-etiology unclear. He apparently had a cardiac evaluation at [facility] recently that was "normal". We need to obtain those results. If indeed he had normal evaluation, no further ischemia evaluation would be indicated. Continue hydralazine/nitrates. Continue carvedilol. Avoid ACE inhibitor for now given progressive renal insufficiency. Note TSH  normal. 3 adenopathy-biopsy results consistent with metastatic adenocarcinoma; oncology has seen and evaluation in progress. 4 acute on chronic renal failure-renal function unchanged; continue to hold lasix and follow 5 hypertension-blood pressure is improving.  Kirk Ruths 09/15/2014, 7:34 AM

## 2014-09-15 NOTE — Progress Notes (Signed)
He had the MRI of the brain done. We can only do it without contrast. The radiologists are uncertain as to whether or not the changes that are seen represent metastatic disease. There is no brain swelling. The areas in question are very small.  I don't think that his renal function will improve enough to be able to do a contrast scan. As such, I think we can just watch these lesions and repeat the brain MRI in the future. He is asymptomatic with this.  He had a bone scan done. There is one area of uptake in the L3 vertebral body. This corresponds with a sclerotic focus on CT. Of course, the radiologist said that an MRI would be recommended. He is asymptomatic with this area of uptake. As such, I think we can just watch this for now.  He is going for his Port-A-Cath today.  I think the big determinate of his prognosis is good B his cardiac function. He says he's never had any type of heart attack. He has marginal cardiac function at best. I'm not sure as to why this would be the case.  He has chronic renal insufficiency. His creatinine is 2.46 today. He is anemic. I think the anemia is based on his renal insufficiency. He has not deficient.  His prealbumin is 13.2 which is actually a little better than I thought.  I talked today about the MRI. I told him the problem that we have. Again he is asymptomatic so we will watch him and repeat MRI in the future. He understands this and agrees to this.  Once the Port-A-Cath is placed, then we can start treatment on him. I will plan to use carboplatinum/Alimta. Avastin will be also helpful but we will hold this off until the second cycle.  Again, he understands that he is not curable. He realizes the difficulties that we are going to have because his poor cardiac function. I will have to adjust his doses of treatment significantly if we are to treat him.  I appreciate all the great care that he is getting on the floor from the nurses and from all the  doctors.  Pete E.

## 2014-09-15 NOTE — Sedation Documentation (Signed)
Dr. Kathlene Cote paged.  Pt aware.

## 2014-09-15 NOTE — Procedures (Signed)
Procedure:  Port placement Access:  Right IJ vein Findings:  SL Power Port placed with catheter tip at cavoatrial junction.  Accessed and OK to use.

## 2014-09-15 NOTE — Progress Notes (Signed)
PT Cancellation Note  Patient Details Name: Ralph [name] MRN: [id number] DOB: 1951-10-13   Cancelled Treatment:    Reason Eval/Treat Not Completed: Patient at procedure or test/unavailable   Kyrel Leighton,KATHrine E 09/15/2014, 10:26 AM Carmelia Bake, PT, DPT 09/15/2014 Pager: [phone]

## 2014-09-15 NOTE — Procedures (Signed)
US guided therapeutic right thoracentesis performed yielding 1.2 liters turbid, yellow fluid. F/u CXR pending. No immediate complications. Pt also has small-moderate left pleural effusion.

## 2014-09-15 NOTE — Progress Notes (Signed)
Report received from [name],RN. No change in assessment. Will continue plan of care. Ralph [name]

## 2014-09-15 NOTE — Progress Notes (Signed)
Spoke to [place] at [facility] regarding request of medical records, authorization form faxed to [phone]. Doreather Hoxworth A

## 2014-09-16 ENCOUNTER — Encounter (HOSPITAL_COMMUNITY): Payer: Self-pay | Admitting: Hematology & Oncology

## 2014-09-16 DIAGNOSIS — I509 Heart failure, unspecified: Secondary | ICD-10-CM

## 2014-09-16 DIAGNOSIS — C7800 Secondary malignant neoplasm of unspecified lung: Secondary | ICD-10-CM

## 2014-09-16 DIAGNOSIS — N289 Disorder of kidney and ureter, unspecified: Secondary | ICD-10-CM

## 2014-09-16 HISTORY — DX: Secondary malignant neoplasm of unspecified lung: C78.00

## 2014-09-16 LAB — BASIC METABOLIC PANEL
ANION GAP: 11 (ref 5–15)
BUN: 31 mg/dL — ABNORMAL HIGH (ref 6–23)
CO2: 27 meq/L (ref 19–32)
CREATININE: 2.51 mg/dL — AB (ref 0.50–1.35)
Calcium: 8 mg/dL — ABNORMAL LOW (ref 8.4–10.5)
Chloride: 104 mEq/L (ref 96–112)
GFR calc Af Amer: 30 mL/min — ABNORMAL LOW (ref >90)
GFR, EST NON AFRICAN AMERICAN: 26 mL/min — AB (ref >90)
Glucose, Bld: 85 mg/dL (ref 70–99)
Potassium: 4.2 mEq/L (ref 3.7–5.3)
SODIUM: 142 meq/L (ref 137–147)

## 2014-09-16 LAB — CBC
HEMATOCRIT: 26.4 % — AB (ref 39.0–52.0)
Hemoglobin: 8.8 g/dL — ABNORMAL LOW (ref 13.0–17.0)
MCH: 28.3 pg (ref 26.0–34.0)
MCHC: 33.3 g/dL (ref 30.0–36.0)
MCV: 84.9 fL (ref 78.0–100.0)
PLATELETS: 195 10*3/uL (ref 150–400)
RBC: 3.11 MIL/uL — ABNORMAL LOW (ref 4.22–5.81)
RDW: 17.3 % — ABNORMAL HIGH (ref 11.5–15.5)
WBC: 8.1 10*3/uL (ref 4.0–10.5)

## 2014-09-16 LAB — GLUCOSE, CAPILLARY
GLUCOSE-CAPILLARY: 69 mg/dL — AB (ref 70–99)
GLUCOSE-CAPILLARY: 113 mg/dL — AB (ref 70–99)
Glucose-Capillary: 61 mg/dL — ABNORMAL LOW (ref 70–99)
Glucose-Capillary: 96 mg/dL (ref 70–99)
Glucose-Capillary: 84 mg/dL (ref 70–99)
Glucose-Capillary: 179 mg/dL — ABNORMAL HIGH (ref 70–99)

## 2014-09-16 MED ORDER — HEPARIN SOD (PORK) LOCK FLUSH 100 UNIT/ML IV SOLN: 250.0000 [IU] | Freq: Once | INTRAVENOUS | Status: AC | PRN

## 2014-09-16 MED ORDER — SODIUM CHLORIDE 0.9 % IV SOLN
300.0000 mg | Freq: Once | INTRAVENOUS | Status: AC
  Administered 2014-09-16: 300 mg via INTRAVENOUS
  Filled 2014-09-16: qty 30

## 2014-09-16 MED ORDER — DEXAMETHASONE 4 MG PO TABS
8.0000 mg | ORAL_TABLET | Freq: Two times a day (BID) | ORAL | Status: DC
  Administered 2014-09-16 – 2014-09-20 (×10): 8 mg via ORAL
  Filled 2014-09-16 (×12): qty 2

## 2014-09-16 MED ORDER — DRONABINOL 5 MG PO CAPS
5.0000 mg | ORAL_CAPSULE | Freq: Two times a day (BID) | ORAL | Status: DC
  Administered 2014-09-16 – 2014-09-21 (×10): 5 mg via ORAL
  Filled 2014-09-16 (×10): qty 1

## 2014-09-16 MED ORDER — CYANOCOBALAMIN 1000 MCG/ML IJ SOLN
1000.0000 ug | Freq: Once | INTRAMUSCULAR | Status: AC
  Administered 2014-09-16: 1000 ug via INTRAMUSCULAR
  Filled 2014-09-16: qty 1

## 2014-09-16 MED ORDER — CHLORHEXIDINE GLUCONATE CLOTH 2 % EX PADS
6.0000 | MEDICATED_PAD | Freq: Every day | CUTANEOUS | Status: AC
  Administered 2014-09-16 – 2014-09-20 (×5): 6 via TOPICAL

## 2014-09-16 MED ORDER — MUPIROCIN 2 % EX OINT
1.0000 "application " | TOPICAL_OINTMENT | Freq: Two times a day (BID) | CUTANEOUS | Status: AC
  Administered 2014-09-16 – 2014-09-20 (×10): 1 via NASAL
  Filled 2014-09-16 (×2): qty 22

## 2014-09-16 MED ORDER — DEXTROSE 50 % IV SOLN
25.0000 mL | Freq: Once | INTRAVENOUS | Status: AC | PRN
Start: 2014-09-16 — End: 2014-09-16

## 2014-09-16 MED ORDER — BOOST PLUS PO LIQD
237.0000 mL | Freq: Three times a day (TID) | ORAL | Status: DC
  Administered 2014-09-16 – 2014-09-17 (×2): 237 mL via ORAL
  Filled 2014-09-16 (×5): qty 237

## 2014-09-16 MED ORDER — DEXTROSE 50 % IV SOLN
INTRAVENOUS | Status: AC
  Administered 2014-09-16: 25 mL
  Filled 2014-09-16: qty 50

## 2014-09-16 MED ORDER — FOLIC ACID 1 MG PO TABS
1.0000 mg | ORAL_TABLET | Freq: Every day | ORAL | Status: DC
  Administered 2014-09-16 – 2014-09-21 (×6): 1 mg via ORAL
  Filled 2014-09-16 (×6): qty 1

## 2014-09-16 MED ORDER — SODIUM CHLORIDE 0.9 % IV SOLN: Freq: Once | INTRAVENOUS | Status: DC

## 2014-09-16 MED ORDER — ALTEPLASE 2 MG IJ SOLR
2.0000 mg | Freq: Once | INTRAMUSCULAR | Status: AC | PRN
  Filled 2014-09-16: qty 2

## 2014-09-16 MED ORDER — HEPARIN SOD (PORK) LOCK FLUSH 100 UNIT/ML IV SOLN
500.0000 [IU] | Freq: Once | INTRAVENOUS | Status: AC | PRN
Start: 2014-09-16 — End: 2014-09-16

## 2014-09-16 MED ORDER — SODIUM CHLORIDE 0.9 % IV SOLN
Freq: Once | INTRAVENOUS | Status: AC
  Administered 2014-09-16: 16 mg via INTRAVENOUS
  Filled 2014-09-16: qty 8

## 2014-09-16 MED ORDER — SODIUM CHLORIDE 0.9 % IV SOLN
375.0000 mg/m2 | Freq: Once | INTRAVENOUS | Status: AC
  Administered 2014-09-16: 775 mg via INTRAVENOUS
  Filled 2014-09-16: qty 31

## 2014-09-16 MED ORDER — SODIUM CHLORIDE 0.9 % IJ SOLN: 10.0000 mL | INTRAMUSCULAR | Status: DC | PRN

## 2014-09-16 MED ORDER — SODIUM CHLORIDE 0.9 % IJ SOLN: 3.0000 mL | INTRAMUSCULAR | Status: DC | PRN

## 2014-09-16 NOTE — Progress Notes (Signed)
Hypoglycemic Event  CBG: 69 at 0315  Treatment: 15 GM carbohydrate snack -ginger ale 1 can   Symptoms: None  Follow-up CBG: Time:0357 CBG Result:96  Possible Reasons for Event: medication related, inadequate meal intake, activity change  Comments/MD notified: had been given 1/2 amp of d50 earlier due to patient refusing to drink or take anything by mouth.     Ralph [name]  Remember to initiate Hypoglycemia Order Set & completeAdult (Non-Pregnant) Hypoglycemia Protocol Treatment Guidelines  1.  RN shall initiate Hypoglycemia Protocol emergency measures immediately when:            w Routine or STAT CBG and/or a lab glucose indicates hypoglycemia (CBG < 70 mg/dl)  2.  Treat the patient according to ability to take PO's and severity of hypoglycemia.   3.  If patient is on GlucoStabilizer, follow directions provided by the [facility] for hypoglycemic events.  4.  If patient on insulin pump, follow Hypoglycemia Protocol.  If patient requires more than one treatment have patient place pump in SUSPEND and notify MD.  DO NOT leave pump in SUSPEND for greater than 30 minutes unless ordered by MD.  A.  Treatment for Mild or Moderate-Patient cooperative and able to swallow    1.  Patient taking PO's and can cooperate   a.  Give one of the following 15 gram CHO options:                           w 1 tube oral dextrose gel                           w 3-4 Glucose tablets                           w 4 oz. Juice                           w 4 oz. regular soda                                    ESRD patients:  clear, regular soda                           w 8 oz. skim milk    b.  Recheck CBG in 15 minutes after treatment                            w If CBG < 70 mg/dl, repeat treatment and recheck until hypoglycemia is resolved                            w If CBG > 70 mg/dl and next meal is more than 1 hour away, give additional 15 grams CHO   2.  Patient NPO-Patient  cooperative and no altered mental status    a.  Give 25 ml of D50 IV.   b.  Recheck CBG in 15 minutes after treatment.                             w If CBG is less than 70  mg/dl, repeat treatment and recheck until hypoglycemia is resolved.   c.  Notify MD for further orders.             SPECIAL CONSIDERATIONS:    a.  If no IV access,                              w Start IV of D5W at [facility]                             w Give 25 ml of D50 IV.    b.  If unable to gain IV access                             w Give Glucagon IM:    i.  1 mg if patient weighs more than 45.5 kg    ii.  0.5 mg if patient weighs less than 45.5 kg   c.  Notify MD for further orders  B.  Treatment for Severe-- Patient unconscious or unable to take PO's safely    1.  Position patient on side   2.  Give 50 ml D50 IV   3.  Recheck CBG in 15 minutes.                    w If CBG is less than 70 mg/dl, repeat treatment and recheck until hypoglycemia is resolved.   4.  Notify MD for further orders.    SPECIAL CONSIDERATIONS:    a.  If no IV access                              w Give Glucagon IM                                        i.  1 mg if patient weighs more than 45.5 kg                                       ii.  0.5 mg if patient weighs less than 45.5 kg                              w Start IV of D5W at 50 ml/hr and give 50 ml D50 IV   b.  If no IV access and active seizure                               w Call Rapid Response   c.  If unable to gain IV access, give Glucagon IM:                              w 1 mg if patient weighs more than 45.5 kg                              w 0.5 mg if patient weighs less than  45.5 kg   d.  Notify MD for further orders.  C.  Complete smart text progress note to document intervention and follow-up CBG   1.  In [facility] patient chart, click on Notes (left side of screen)   2.  Create Progress Note   3.  Click on [employer].  In the Match box type "hypo" and enter     4.  Double click on [facility] IP HYPOGLYCEMIC EVENT and enter data   5.  MD must be notified if patient is NPO or experienced severe hypoglycemia

## 2014-09-16 NOTE — Progress Notes (Signed)
Patient CBG 61 patient refused to drink any liquids or take anything by mouth. Given 1/2 amp of D50 instead. Patient alert. Without any complaints.

## 2014-09-16 NOTE — Progress Notes (Signed)
Ralph [name] is now on the third floor. He had a Port-A-Cath placed. He had 1.2 L of fluid removed from his right lung. This is malignant.  He has been seen by cardiology. He has heart failure. This is of unclear etiology. Because of this, he also has some renal insufficiency.  I think we have to move ahead with treatment on him.  I spoke to his sister and father today. I gave them a thorough update. I told them that the chance of treatment working probably is going to be, at best, 25%. I think that if treatment does not work, he will not make it to the end of the year.  He has a living will. He does not want to be resuscitated or kept on machines. I talked to his sister and father about this. They fully agree with this decision. I agree with this. Keep him alive on a machine given that he has marginal cardiac function, marginal kidney function, and stage IV lung cancer, would not be helping him. As such, he is a DO NOT RESUSCITATE.  He is just not eating much. His prealbumin is 13.2. I will try him on some Marinol.  There is no way that he can go home. He may have to go do some rehabilitation facility.  He is doing some physical therapy. I'm not sure how much he really is able to do.  To me, I really believe that his nutritional state will be a key factor. If he does not eat, I really don't think that his response to chemotherapy would be significant.  I talked to his sister and father for over a half-hour. They are well aware of the critical nature of his disease. The understanding that he possibly may have brain metastasis but that we really cannot further evaluate this because of his renal function. They realize that I cannot give full dose chemotherapy because of his cardiac and renal function. I believe that they agree that his quality of life is more important than quantity of life. I totally concur with this.  I think that we will know within a month whether or not he is responding. If his  fluid does not come back, then I think that we will probably see a response.  Avastin is very helpful in situations with pleural fluid accumulation. We cannot use this right now since he does have a Port-A-Cath placed.  He is mildly anemic. I did send off an erythropoietin level on him. I have to believe this is going to be low. If it is, then possibly a dose of Aranesp will help.  His physical exam is pretty much unchanged. His blood pressure is 133/66. He is afebrile.  I totally understand that we have a very tenuous situation. I does want to try to give him a chance if possible. I think if we get a marginal response, he will feel a little better.  I very much appreciate the great care that he is getting from the staff on [place] and from the hospitalist and the other physicians.  Pete E.  Ephesians 2:8-9

## 2014-09-16 NOTE — Progress Notes (Signed)
Clinical Social Work  CSW received call from [employer] asking to [employer]. CSW met with patient and family at bedside. Sister asked CSW to speak with her in the hallway. Sister reports chaplain assisted with completing packet but it needs to be notarized. CSW explained that RN has documented that patient has been confused so patient is unable to sign forms at this time. CSW will continue to follow and can notarize if patient becomes alert and oriented X4. Family aware of plans.  Sindy Messing, LCSW (Weekend Coverage)

## 2014-09-16 NOTE — Progress Notes (Signed)
       Patient Name: Ralph [name] Date of Encounter: 09/16/2014    SUBJECTIVE: Lying flat in bed. Denies dyspnea.  TELEMETRY:  Not on telemetry. Filed Vitals:   09/15/14 2136 09/15/14 2150 09/16/14 0444 09/16/14 0525  BP: 142/66  133/66   Pulse: 87 78 82   Temp: 97.9 F (36.6 C)  97.3 F (36.3 C)   TempSrc: Oral  Oral   Resp: 16  16   Height:      Weight:    183 lb 3.2 oz (83.1 kg)  SpO2: 98%  99%     Intake/Output Summary (Last 24 hours) at 09/16/14 0733 Last data filed at 09/16/14 0626  Gross per 24 hour  Intake 969.63 ml  Output    700 ml  Net 269.63 ml   LABS: Basic Metabolic Panel:  Recent Labs  09/15/14 0405 09/16/14 0520  NA 141 142  K 4.2 4.2  CL 103 104  CO2 25 27  GLUCOSE 72 85  BUN 35* 31*  CREATININE 2.46* 2.51*  CALCIUM 8.4 8.0*   CBC:  Recent Labs  09/15/14 0405 09/16/14 0520  WBC 9.2 8.1  HGB 9.6* 8.8*  HCT 29.0* 26.4*  MCV 82.4 84.9  PLT 208 195    Radiology/Studies:  No new relevant cardiac data  Physical Exam: Blood pressure 133/66, pulse 82, temperature 97.3 F (36.3 C), temperature source Oral, resp. rate 16, height 6' (1.829 m), weight 183 lb 3.2 oz (83.1 kg), SpO2 99.00%. Weight change: -1.1 oz (-0.031 kg)  Wt Readings from Last 3 Encounters:  09/16/14 183 lb 3.2 oz (83.1 kg)  09/16/14 183 lb 3.2 oz (83.1 kg)  03/20/13 175 lb (79.379 kg)    Neck veins are difficult to assess Lungs are clear anteriorly and diminished at at the bases Cardiac exam without rub or gallop There is no peripheral edema  ASSESSMENT:  1. Systolic heart failure, acute and of uncertain etiology, clinically compensated volume status at this time. 2. Chronic kidney disease with acute component 3. Metastatic adenocarcinoma  Plan:  1. Optimize heart failure therapy (beta blocker, hydralazine and nitrates, and diuretic regimen).  2. Avoid ARB/Ace therapy given kidney failure 3. Give [place] a call if we may be of further  assistance.  Demetrios Isaacs 09/16/2014, 7:33 AM

## 2014-09-16 NOTE — Progress Notes (Signed)
[facility] PROGRESS NOTE  Ralph [name] UVO:[id number] DOB: Apr 10, 1951 DOA: 09/10/2014 PCP: No PCP Per Patient  Brief Summary  63 year old male with past medical history of diabetes (managed with PO meds), anxiety, depression who presented to [facility] ED 09/10/2014 with worsening shortness of breath, dry cough, and leg swelling.  He also reported fatigue and weakness and feeling "really sick".  In ED, BP was 177/95 and oxygen saturation was 89% on room air.  CT angio chest neg for pulmonary embolism but showed extensive thoracic and upper abdominal adenopathy questionable nodule versus atelectasis in LEFT lower lobe, bilateral pleural effusions greater on the right.  He underwent thoracentesis and had a transudative effusion.  Duplex lower extremity was negative for DVT.  ECHO demonstrated EF of 20-25% in patient with no previous history of CAD.  Cardiology was consulted and he is undergoing diuresis.  He had excisional LN biopsy on 10/27 and oncology to consult based on pending pathology.  Assessment/Plan  Acute respiratory failure with hypoxia secondary to large right Malignant pleural effusion and on admission  secondary to acute systolic heart failure.  -  Thoracentesis perform by radiology on 10-26 yielding 1.7 L. Repeated thoracentesis 10-30 yielding 1.2 L.   -  pleural fluid culture and cytology: with malignant cell consistent with adenocarcinoma. No bacteria growth.  -  ECHO:  Mildly dilated LVF, severely reduced systolic function, EF of 74-25%, diffuse hypokinesis, and grade 1 DD, left atrium moderately dilated, small pericardial effusion -  Ferritin wnl, no globulin gap to suggest MM or amyloid  -  Started on ACEI, but creatinine increased so discontinued -  Started bidil TID -  Continue beta blocker -  Strict I/O and daily weights -  Holding lasix due to worsening renal function.  -  Cardiology consulted for possible ischemic work up given ECG findings (possible previous septal  infarct, lateral T-wave inversions) Feeling better today, dyspnea improved.    Acute Systolic HF; EF of 95-63% Compensated at this time. Holding lasix due to worsening renal function.   PNA; chest x ray 10-29 with bilateral infiltrates. started on vancomycin and zosyn. Day 3.   Metastatic Adenocarcinoma probably from Lung primary.  -Diffuse lymphadenopathy found incidentally on CT of the chest - Patient is reportedly up-to-date on his colon cancer and prostate screening  - CT of the abdomen and pelvis: Diffuse adenopathy, no evidence of mass - LDH 499 H, uric acid 7.2, hepatitis panel neg, HIV NR, beta-2 microglobulin pending, ESR 8 - Appreciate general surgery assistance: Excisional LN biopsy performed on 10/27 -pathology from lymph node biopsy showed adenocarcinoma.  -oncology consulted. Appreciate [name] evaluation. PSA 3.2 and CEA 4.8.  -Port cath placement 10-30 -MRI with multiple brain lesion stroke versus metastasis diseases. Plan to repeat MRI in the future.  -Plan to start chemo today.   Generalized weakness with progressive failure to thrive  -  PT/OT   Likely severe protein calorie malnutrition. Patient denies that he has had weight loss but his family states he has lost a lot of weight over the last year -  Liberalized diet -  Supplements -  Nutrition consultation  Hypertension, with elevated blood pressures -  Started on metoprolol  -  Add bidil -  Hold ACEI due to AKI  Nonsustained VT - Keep electrolytes within normal limits - On beta blocker.  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 Patient needs  to follow up with vascular outpatient. No need for anticoagulation.  ABI; 0.58, right, left 1.14.  duplex ordered.    Diabetes mellitus, hemoglobin A1c was previously around 12, currently 7.6, possibly due to weight loss, CBG mildly elevated despite NPO for procedure -  Diabetic  diet -  mod sliding-scale insulin with meals - will discontinue levemir, patient not eating enough.   Acute on CKD stage 3 due to diabetes and HTN plus recent IV contrast.  Significant proteinuria on exam -  Renally dose medications -  Minimize nephrotoxins  -  Urine protein-creatinine  -  lasix on hold today.  -Cr stable, follow trend.   Anxiety and depression, continue clonazepam and lexapro  Normocytic anemia, hgb approximately stable, likely due to chronic disease -  Iron studies wnl, b12 623, folate 1160, TSH 3.62 - erythropoietin level ordered.   Cigarette nicotine abuse and dependence with withdrawal.  Smokes a carton per week -  Smoking cessation counseling -  Nicotine patch  Diet:  diabetic Access:  PIV IVF:  off Proph:  lovenox  Code Status: full Family Communication: patient .  Disposition Plan:  Awaiting improvement of renal function , chemotherapy to be started today.    Consultants:  IR   General surgery  [name].   Cardiology, Dr. [name]  Procedures:  CXR  CT angio chest  CT ab/p   US thoracentesis on 10/26  Antibiotics: Vancomycin Zosyn  HPI/Subjective: He has some nausea. He is willing to eat breakfast.  No worsening dyspnea.   Objective: Filed Vitals:   09/15/14 2136 09/15/14 2150 09/16/14 0444 09/16/14 0525  BP: 142/66  133/66   Pulse: 87 78 82   Temp: 97.9 F (36.6 C)  97.3 F (36.3 C)   TempSrc: Oral  Oral   Resp: 16  16   Height:      Weight:    83.1 kg (183 lb 3.2 oz)  SpO2: 98%  99%     Intake/Output Summary (Last 24 hours) at 09/16/14 0853 Last data filed at 09/16/14 0626  Gross per 24 hour  Intake 969.63 ml  Output    700 ml  Net 269.63 ml   Filed Weights   09/15/14 0541 09/15/14 1805 09/16/14 0525  Weight: 84.4 kg (186 lb 1.1 oz) 84.369 kg (186 lb) 83.1 kg (183 lb 3.2 oz)    Exam:   General:  Mildly cachectic   HEENT: right neck dressing c/d/i  Cardiovascular:  RRR, nl S1, S2 no  mrg  Respiratory:  Decreases breath sound, mild crackles.   Abdomen:   NABS, soft, NT/ND  MSK:   Normal tone and bulk, 2+ pitting LLE and 1+ pitting RLE edema  Data Reviewed: Basic Metabolic Panel:  Recent Labs Lab 09/10/14 1223 09/10/14 1803  09/12/14 0620 09/13/14 0325 09/14/14 0446 09/15/14 0405 09/16/14 0520  NA 143 139  < > 142 143 140 141 142  K 3.8 3.6*  < > 3.7 4.0 4.4 4.2 4.2  CL 103 101  < > 104 105 102 103 104  CO2 27 27  < > 27 26 25 25 27   GLUCOSE 231* 248*  < > 173* 124* 123* 72 85  BUN 23 22  < > 27* 31* 35* 35* 31*  CREATININE 1.49* 1.44*  < > 1.90* 2.63* 2.67* 2.46* 2.51*  CALCIUM 8.1* 8.1*  < > 8.2* 7.8* 8.3* 8.4 8.0*  MG  --  1.7  --   --   --   --   --   --  PHOS  --  3.6  --   --   --   --   --   --   < > = values in this interval not displayed. Liver Function Tests:  Recent Labs Lab 09/10/14 1223 09/10/14 1803 09/11/14 0400  AST 22 19 17   ALT 12 12 12   ALKPHOS 103 102 98  BILITOT 0.3 0.3 0.3  PROT 6.0 5.9* 5.5*  ALBUMIN 2.8* 2.8* 2.6*   No results found for this basename: LIPASE, AMYLASE,  in the last 168 hours No results found for this basename: AMMONIA,  in the last 168 hours CBC:  Recent Labs Lab 09/10/14 1223 09/10/14 1803  09/12/14 0620 09/13/14 0325 09/14/14 0446 09/15/14 0405 09/16/14 0520  WBC 10.2 9.3  < > 12.3* 9.5 10.5 9.2 8.1  NEUTROABS 7.5 6.4  --   --   --   --   --   --   HGB 10.9* 10.5*  < > 10.8* 9.1* 10.0* 9.6* 8.8*  HCT 31.7* 30.9*  < > 32.4* 26.8* 29.3* 29.0* 26.4*  MCV 82.3 82.2  < > 82.9 85.6 84.0 82.4 84.9  PLT 175 170  < > 217 165 191 208 195  < > = values in this interval not displayed. Cardiac Enzymes:  Recent Labs Lab 09/11/14 2030 09/12/14 0035 09/12/14 0620  TROPONINI <0.30 <0.30 <0.30   BNP (last 3 results)  Recent Labs  09/12/14 0620  PROBNP 8587.0*   CBG:  Recent Labs Lab 09/15/14 2139 09/16/14 0151 09/16/14 0315 09/16/14 0357 09/16/14 0809  GLUCAP 89 61* 69* 96 84     Recent Results (from the past 240 hour(s))  BODY FLUID CULTURE     Status: None   Collection Time    09/11/14 10:28 AM      Result Value Ref Range Status   Specimen Description PLEURAL RIGHT   Final   Special Requests Immunocompromised   Final   Gram Stain     Final   Value: NO WBC SEEN     NO ORGANISMS SEEN     Performed at [facility]   Culture     Final   Value: NO GROWTH 3 DAYS     Performed at [facility]   Report Status 09/14/2014 FINAL   Final  SURGICAL PCR SCREEN     Status: Abnormal   Collection Time    09/12/14  8:39 AM      Result Value Ref Range Status   MRSA, PCR NEGATIVE  NEGATIVE Final   Staphylococcus aureus POSITIVE (*) NEGATIVE Final   Comment:            The Xpert SA Assay (FDA     approved for NASAL specimens     in patients over 58 years of age),     is one component of     a comprehensive surveillance     program.  Test performance has     been validated by [employer] for patients greater     than or equal to 4 year old.     It is not intended     to diagnose infection nor to     guide or monitor treatment.  CULTURE, BLOOD (ROUTINE X 2)     Status: None   Collection Time    09/14/14  3:09 PM      Result Value Ref Range Status   Specimen Description BLOOD LEFT ARM   Final  Special Requests BOTTLES DRAWN AEROBIC AND ANAEROBIC  10 CC   Final   Culture  Setup Time     Final   Value: 09/14/2014 22:53     Performed at [facility]   Culture     Final   Value:        BLOOD CULTURE RECEIVED NO GROWTH TO DATE CULTURE WILL BE HELD FOR 5 DAYS BEFORE ISSUING A FINAL NEGATIVE REPORT     Performed at [facility]   Report Status PENDING   Incomplete  CULTURE, BLOOD (ROUTINE X 2)     Status: None   Collection Time    09/14/14  3:15 PM      Result Value Ref Range Status   Specimen Description BLOOD LEFT ARM   Final   Special Requests BOTTLES DRAWN AEROBIC AND ANAEROBIC  10 CC   Final   Culture  Setup Time      Final   Value: 09/14/2014 22:55     Performed at [facility]   Culture     Final   Value:        BLOOD CULTURE RECEIVED NO GROWTH TO DATE CULTURE WILL BE HELD FOR 5 DAYS BEFORE ISSUING A FINAL NEGATIVE REPORT     Performed at [facility]   Report Status PENDING   Incomplete     Studies: Dg Chest 1 View  09/15/2014   CLINICAL DATA:  Subsequent encounter for left pleural effusion.  EXAM: CHEST - 1 VIEW  COMPARISON:  09/14/2014.  FINDINGS: 1211 hrs. Moderate left pleural effusion has progressed in the interval. No substantial right pleural effusion. No evidence for pneumothorax. Right Port-A-Cath is new in the interval with tip overlying the distal SVC. Cardiopericardial silhouette is upper limits of normal for size. Mild leftward deviation of the trachea stable.  IMPRESSION: No evidence of pneumothorax status post thoracentesis.   Electronically Signed   By: Misty Stanley M.D.   On: 09/15/2014 13:27   Dg Chest 2 View  09/14/2014   CLINICAL DATA:  Progressive shortness of Breath  EXAM: CHEST  2 VIEW  COMPARISON:  One-view chest 09/11/2014  FINDINGS: Heart is enlarged. Bilateral pleural effusions and and lower lobe airspace disease has increased bilaterally. Increased pulmonary vascular congestion is noted as well. The upper lung fields are clear. The visualized soft tissues and the bony thorax are unremarkable.  IMPRESSION: 1. Progressive bilateral lower lobe airspace disease and effusions compatible with progressive disease and probable pneumonia. 2. Prominence of the hila bilaterally may in part represent pulmonary vascular congestion. Known adenopathy is also present.   Electronically Signed   By: Lawrence Santiago M.D.   On: 09/14/2014 11:49   Nm Bone Scan Whole Body  09/14/2014   CLINICAL DATA:  Metastatic lung cancer, history colon cancer, hypertension, diabetes  EXAM: NUCLEAR MEDICINE WHOLE BODY BONE SCAN  TECHNIQUE: Whole body anterior and posterior images were obtained  approximately 3 hours after intravenous injection of radiopharmaceutical.  RADIOPHARMACEUTICALS:  22.0 mCi Technetium-82mMDP IV  COMPARISON:  None  Radiographic correlation: CT chest 09/10/2014, CT abdomen and pelvis 09/11/2014, MR brain 09/14/2014  FINDINGS: Poor osseous tracer accumulation question related to decrease bone turnover, question decreased activity.  Increased soft tissue in urinary tract distribution of tracer.  Uptake at the RIGHT lateral aspect of the lumbar spine at approximately L3, nonspecific ; patient has a single sclerotic focus 7 mm diameter at this approximate position on CT.  Minimal uptake at the shoulders, sternoclavicular  joints, elbows, knees, and feet, distribution typically degenerative.  No additional worrisome sites of abnormal tracer accumulation identified to suggest osseous metastatic disease.  IMPRESSION: Single mild focus of increased tracer localization at the RIGHT lateral aspect of the approximately L3 vertebral body, roughly corresponding in position with a sclerotic focus on recent CT; sclerotic metastatic lesion not excluded and evaluation of the lumbar spine by MR imaging recommended.  Scattered degenerative type uptake.  Poor osseous tracer accumulation suggesting decreased activity/poor bone turnover.   Electronically Signed   By: Lavonia Dana M.D.   On: 09/14/2014 14:20   Ir Fluoro Guide Cv Line Right  09/15/2014   CLINICAL DATA:  Metastatic adenocarcinoma presumably of lung primary. The patient requires a porta cath to begin chemotherapy.  EXAM: IMPLANTED PORT A CATH PLACEMENT WITH ULTRASOUND AND FLUOROSCOPIC GUIDANCE  ANESTHESIA/SEDATION: 2.0 Mg IV Versed; 100 mcg IV Fentanyl  Total Moderate Sedation Time:  33 minutes  Additional Medications: 2 g IV Ancef. As antibiotic prophylaxis, Ancef was ordered pre-procedure and administered intravenously within one hour of incision.  FLUOROSCOPY TIME:  12 seconds.  PROCEDURE: The procedure, risks, benefits, and  alternatives were explained to the patient. Questions regarding the procedure were encouraged and answered. The patient understands and consents to the procedure.  The right neck and chest were prepped with chlorhexidine in a sterile fashion, and a sterile drape was applied covering the operative field. Maximum barrier sterile technique with sterile gowns and gloves were used for the procedure. A time-out procedure was performed. Local anesthesia was provided with 1% lidocaine.  Ultrasound was used to confirm patency of the right internal jugular vein. After creating a small venotomy incision, a 21 gauge needle was advanced into the right internal jugular vein under direct, real-time ultrasound guidance. Ultrasound image documentation was performed. After securing guidewire access, an 8 Fr dilator was placed. A J-wire was kinked to measure appropriate catheter length.  A subcutaneous port pocket was then created along the upper chest wall utilizing sharp and blunt dissection. Portable cautery was utilized. The pocket was irrigated with sterile saline.  A single lumen power injectable port was chosen for placement. The 8 Fr catheter was tunneled from the port pocket site to the venotomy incision. The port was placed in the pocket. External catheter was trimmed to appropriate length based on guidewire measurement.  At the venotomy, an 8 Fr peel-away sheath was placed over a guidewire. The catheter was then placed through the sheath and the sheath removed. Final catheter positioning was confirmed and documented with a fluoroscopic spot image. The port was accessed with a needle and aspirated and flushed with heparinized saline. The needle was removed.  The venotomy and port pocket incisions were closed with subcutaneous 3-0 Monocryl and subcuticular 4-0 Vicryl. Dermabond was applied to both incisions.  COMPLICATIONS: None  FINDINGS: After catheter placement, the tip lies at the cavoatrial junction. The catheter  aspirates normally and is ready for immediate use.  IMPRESSION: Placement of single lumen port a cath via right internal jugular vein. The catheter tip lies at the cavoatrial junction. A power injectable port a cath was placed and is ready for immediate use.   Electronically Signed   By: Aletta Edouard M.D.   On: 09/15/2014 12:41   Ir US Guide Vasc Access Right  09/15/2014   CLINICAL DATA:  Metastatic adenocarcinoma presumably of lung primary. The patient requires a porta cath to begin chemotherapy.  EXAM: IMPLANTED PORT A CATH PLACEMENT WITH ULTRASOUND AND FLUOROSCOPIC GUIDANCE  ANESTHESIA/SEDATION: 2.0 Mg IV Versed; 100 mcg IV Fentanyl  Total Moderate Sedation Time:  33 minutes  Additional Medications: 2 g IV Ancef. As antibiotic prophylaxis, Ancef was ordered pre-procedure and administered intravenously within one hour of incision.  FLUOROSCOPY TIME:  12 seconds.  PROCEDURE: The procedure, risks, benefits, and alternatives were explained to the patient. Questions regarding the procedure were encouraged and answered. The patient understands and consents to the procedure.  The right neck and chest were prepped with chlorhexidine in a sterile fashion, and a sterile drape was applied covering the operative field. Maximum barrier sterile technique with sterile gowns and gloves were used for the procedure. A time-out procedure was performed. Local anesthesia was provided with 1% lidocaine.  Ultrasound was used to confirm patency of the right internal jugular vein. After creating a small venotomy incision, a 21 gauge needle was advanced into the right internal jugular vein under direct, real-time ultrasound guidance. Ultrasound image documentation was performed. After securing guidewire access, an 8 Fr dilator was placed. A J-wire was kinked to measure appropriate catheter length.  A subcutaneous port pocket was then created along the upper chest wall utilizing sharp and blunt dissection. Portable cautery was  utilized. The pocket was irrigated with sterile saline.  A single lumen power injectable port was chosen for placement. The 8 Fr catheter was tunneled from the port pocket site to the venotomy incision. The port was placed in the pocket. External catheter was trimmed to appropriate length based on guidewire measurement.  At the venotomy, an 8 Fr peel-away sheath was placed over a guidewire. The catheter was then placed through the sheath and the sheath removed. Final catheter positioning was confirmed and documented with a fluoroscopic spot image. The port was accessed with a needle and aspirated and flushed with heparinized saline. The needle was removed.  The venotomy and port pocket incisions were closed with subcutaneous 3-0 Monocryl and subcuticular 4-0 Vicryl. Dermabond was applied to both incisions.  COMPLICATIONS: None  FINDINGS: After catheter placement, the tip lies at the cavoatrial junction. The catheter aspirates normally and is ready for immediate use.  IMPRESSION: Placement of single lumen port a cath via right internal jugular vein. The catheter tip lies at the cavoatrial junction. A power injectable port a cath was placed and is ready for immediate use.   Electronically Signed   By: Aletta Edouard M.D.   On: 09/15/2014 12:41   US Thoracentesis Asp Pleural Space W/img Guide  09/15/2014   INDICATION: Metastatic lung adenocarcinoma, bilateral pleural effusions right greater than left. Request is made for therapeutic right thoracentesis .  EXAM: ULTRASOUND-GUIDED THERAPEUTIC RIGHT THORACENTESIS  COMPARISON:  PRIOR THORACENTESIS ON 09/11/2014  MEDICATIONS: None  COMPLICATIONS: None immediate  TECHNIQUE: Informed written consent was obtained from the patient after a discussion of the risks, benefits and alternatives to treatment. A timeout was performed prior to the initiation of the procedure.  Initial ultrasound scanning demonstrates a moderate to large right pleural effusion. The lower chest was  prepped and draped in the usual sterile fashion. 1% lidocaine was used for local anesthesia.  Under direct ultrasound guidance, a 19 gauge, 7-cm, Yueh catheter was introduced. An ultrasound image was saved for documentation purposes. The thoracentesis was performed. The catheter was removed and a dressing was applied. The patient tolerated the procedure well without immediate post procedural complication. The patient was escorted to have an upright chest radiograph.  FINDINGS: A total of approximately 1.2 liters of turbid, yellow fluid was removed.  IMPRESSION: Successful ultrasound-guided therapeutic right sided thoracentesis yielding 1.2 liters of pleural fluid.  Read by: Rowe Robert, PA-C   Electronically Signed   By: Aletta Edouard M.D.   On: 09/15/2014 13:20    Scheduled Meds: . carvedilol  6.25 mg Oral BID WC  . Chlorhexidine Gluconate Cloth  6 each Topical Daily  . cyanocobalamin  1,000 mcg Intramuscular Once  . dexamethasone  8 mg Oral Q12H  . dronabinol  5 mg Oral BID AC  . enoxaparin (LOVENOX) injection  40 mg Subcutaneous Q24H  . escitalopram  10 mg Oral Daily  . feeding supplement (GLUCERNA SHAKE)  237 mL Oral TID BM  . folic acid  1 mg Oral Daily  . insulin aspart  0-15 Units Subcutaneous TID WC  . isosorbide-hydrALAZINE  1 tablet Oral TID  . mupirocin ointment  1 application Nasal BID  . nicotine  21 mg Transdermal Daily  . pantoprazole (PROTONIX) IV  40 mg Intravenous Q24H  . piperacillin-tazobactam (ZOSYN)  IV  3.375 g Intravenous 3 times per day  . vancomycin  1,250 mg Intravenous Q24H   Continuous Infusions: . sodium chloride 10 mL/hr at 09/16/14 0215    Principal Problem:   Respiratory failure with hypoxia Active Problems:   Essential hypertension   Pleural effusion, right   Diabetes mellitus type 2, controlled, without complications   Anemia   Acute renal failure   Protein-calorie malnutrition, severe   Acute systolic congestive heart failure   Metastatic lung  carcinoma    Time spent: 30 min    Edwardo Wojnarowski, [facility] Pager [phone]. If 7PM-7AM, please contact night-coverage at [email or web address], password [facility] 09/16/2014, 8:53 AM  LOS: 6 days

## 2014-09-16 NOTE — Progress Notes (Signed)
Patient given Chemotherapy and You booklet and carenotes on chemotherapy (Alimta & carbo).

## 2014-09-17 DIAGNOSIS — E43 Unspecified severe protein-calorie malnutrition: Secondary | ICD-10-CM

## 2014-09-17 LAB — CBC
HEMATOCRIT: 29.2 % — AB (ref 39.0–52.0)
Hemoglobin: 9.7 g/dL — ABNORMAL LOW (ref 13.0–17.0)
MCH: 27.7 pg (ref 26.0–34.0)
MCHC: 33.2 g/dL (ref 30.0–36.0)
MCV: 83.4 fL (ref 78.0–100.0)
PLATELETS: 276 10*3/uL (ref 150–400)
RBC: 3.5 MIL/uL — AB (ref 4.22–5.81)
RDW: 17.1 % — ABNORMAL HIGH (ref 11.5–15.5)
WBC: 10.4 10*3/uL (ref 4.0–10.5)

## 2014-09-17 LAB — GLUCOSE, CAPILLARY
GLUCOSE-CAPILLARY: 244 mg/dL — AB (ref 70–99)
Glucose-Capillary: 210 mg/dL — ABNORMAL HIGH (ref 70–99)
Glucose-Capillary: 170 mg/dL — ABNORMAL HIGH (ref 70–99)
Glucose-Capillary: 154 mg/dL — ABNORMAL HIGH (ref 70–99)

## 2014-09-17 LAB — BASIC METABOLIC PANEL
Anion gap: 17 — ABNORMAL HIGH (ref 5–15)
BUN: 36 mg/dL — ABNORMAL HIGH (ref 6–23)
CO2: 21 meq/L (ref 19–32)
Calcium: 8.1 mg/dL — ABNORMAL LOW (ref 8.4–10.5)
Chloride: 103 mEq/L (ref 96–112)
Creatinine, Ser: 2.47 mg/dL — ABNORMAL HIGH (ref 0.50–1.35)
GFR calc Af Amer: 31 mL/min — ABNORMAL LOW (ref >90)
GFR calc non Af Amer: 26 mL/min — ABNORMAL LOW (ref >90)
GLUCOSE: 200 mg/dL — AB (ref 70–99)
POTASSIUM: 4.8 meq/L (ref 3.7–5.3)
SODIUM: 141 meq/L (ref 137–147)

## 2014-09-17 LAB — VANCOMYCIN, TROUGH: Vancomycin Tr: 20.6 ug/mL — ABNORMAL HIGH (ref 10.0–20.0)

## 2014-09-17 MED ORDER — DENOSUMAB 120 MG/1.7ML ~~LOC~~ SOLN: 120.0000 mg | Freq: Once | SUBCUTANEOUS | Status: DC

## 2014-09-17 MED ORDER — VANCOMYCIN HCL IN DEXTROSE 1-5 GM/200ML-% IV SOLN
1000.0000 mg | INTRAVENOUS | Status: DC
  Administered 2014-09-17 – 2014-09-19 (×3): 1000 mg via INTRAVENOUS
  Filled 2014-09-17 (×3): qty 200

## 2014-09-17 MED ORDER — ONDANSETRON HCL 4 MG PO TABS
4.0000 mg | ORAL_TABLET | Freq: Three times a day (TID) | ORAL | Status: DC
  Administered 2014-09-18 – 2014-09-21 (×7): 4 mg via ORAL
  Filled 2014-09-17 (×15): qty 1

## 2014-09-17 MED ORDER — ONDANSETRON 8 MG/NS 50 ML IVPB
8.0000 mg | Freq: Three times a day (TID) | INTRAVENOUS | Status: DC
  Administered 2014-09-17 – 2014-09-21 (×6): 8 mg via INTRAVENOUS
  Filled 2014-09-17 (×14): qty 8

## 2014-09-17 MED ORDER — SODIUM CHLORIDE 0.9 % IV SOLN
60.0000 mg | Freq: Once | INTRAVENOUS | Status: AC
  Administered 2014-09-17: 60 mg via INTRAVENOUS
  Filled 2014-09-17: qty 6.67
  Filled 2014-09-17: qty 20

## 2014-09-17 NOTE — Progress Notes (Signed)
Ralph [name] tolerated chemotherapy quite well. He actually looks better today. He is much more alert.I suspect that is probably Decadron as helping him. He has a better appetite. He now is on some Marinol.  He does need physical therapy quite a bit. Hopefully he can get some physical therapy to help.  He's having no problems with breathing. He's having no problems with nausea. I will make his Zofran on schedule right now.   His labs look pretty good. Hemoglobin is 9.7. Creatinine is 2.47.  On his physical exam,his vital signs are stable. Blood pressure 153/72. Temperature 97.8 and pulse is 92. Oxygen saturation is 95%. Head and neck exam shows no ocular or oral lesions. He has no mucositis. There is no obvious adenopathy in the neck. Lungs are with good breath sounds bilaterally. No wheezes are noted. Cardiac exam regular in rhythm. Abdomen is soft. Has good bowel sounds. Extremities shows no clubbing, cyanosis or edema.  He has metastatic adenocarcinoma of the lung. I truly believe that this is a lung primary. This is clinically most likely. I am awaiting the genetic markers.  I would like to give him some for his potential bone lesions. I cannot give him Zometa because of his renal function. Possibly, the hospital has XGeva.  As far as getting him ready for discharge, I'm not sure what is planned. I don't think he can go home. He may need to go to some skilled nursing facility.  Ralph [name]  1 John 7-9

## 2014-09-17 NOTE — Progress Notes (Signed)
We will sign off.

## 2014-09-17 NOTE — Progress Notes (Signed)
ANTIBIOTIC CONSULT NOTE - INITIAL  Pharmacy Consult for vancomycin/zosyn Indication: rule out pneumonia/HCAP  No Known Allergies  Patient Measurements: Height: 6' (182.9 cm) Weight: 183 lb 3.2 oz (83.1 kg) IBW/kg (Calculated) : 77.6 Vital Signs: Temp: 97.8 F (36.6 C) (11/01 0708) Temp Source: Oral (11/01 0708) BP: 153/72 mmHg (11/01 0708) Pulse Rate: 92 (11/01 0708) Intake/Output from previous day: 10/31 0701 - 11/01 0700 In: 480 [P.O.:480] Out: 700 [Urine:700] Intake/Output from this shift: Total I/O In: -  Out: 250 [Urine:250]  Labs:  Recent Labs  09/15/14 0405 09/16/14 0520 09/17/14 0630  WBC 9.2 8.1 10.4  HGB 9.6* 8.8* 9.7*  PLT 208 195 276  CREATININE 2.46* 2.51* 2.47*   Estimated Creatinine Clearance: 34 mL/min (by C-G formula based on Cr of 2.47).  Recent Labs  09/17/14 0930  VANCOTROUGH 20.6*    Microbiology: Recent Results (from the past 720 hour(s))  Body fluid culture     Status: None   Collection Time: 09/11/14 10:28 AM  Result Value Ref Range Status   Specimen Description PLEURAL RIGHT  Final   Special Requests Immunocompromised  Final   Gram Stain   Final    NO WBC SEEN NO ORGANISMS SEEN Performed at [facility]   Culture   Final    NO GROWTH 3 DAYS Performed at [facility]   Report Status 09/14/2014 FINAL  Final  Surgical pcr screen     Status: Abnormal   Collection Time: 09/12/14  8:39 AM  Result Value Ref Range Status   MRSA, PCR NEGATIVE NEGATIVE Final   Staphylococcus aureus POSITIVE (A) NEGATIVE Final    Comment:        The Xpert SA Assay (FDA approved for NASAL specimens in patients over 28 years of age), is one component of a comprehensive surveillance program.  Test performance has been validated by [employer] for patients greater than or equal to 47 year old. It is not intended to diagnose infection nor to guide or monitor treatment.  Culture, blood (routine x 2)     Status: None (Preliminary  result)   Collection Time: 09/14/14  3:09 PM  Result Value Ref Range Status   Specimen Description BLOOD LEFT ARM  Final   Special Requests BOTTLES DRAWN AEROBIC Ralph ANAEROBIC  10 CC  Final   Culture  Setup Time   Final    09/14/2014 22:53 Performed at [facility]   Culture   Final           BLOOD CULTURE RECEIVED NO GROWTH TO DATE CULTURE WILL BE HELD FOR 5 DAYS BEFORE ISSUING A FINAL NEGATIVE REPORT Performed at [facility]   Report Status PENDING  Incomplete  Culture, blood (routine x 2)     Status: None (Preliminary result)   Collection Time: 09/14/14  3:15 PM  Result Value Ref Range Status   Specimen Description BLOOD LEFT ARM  Final   Special Requests BOTTLES DRAWN AEROBIC Ralph ANAEROBIC  10 CC  Final   Culture  Setup Time   Final    09/14/2014 22:55 Performed at [facility]   Culture   Final           BLOOD CULTURE RECEIVED NO GROWTH TO DATE CULTURE WILL BE HELD FOR 5 DAYS BEFORE ISSUING A FINAL NEGATIVE REPORT Performed at [facility]   Report Status PENDING  Incomplete   Ralph [name], Ralph [name], Ralph not feeling well.  Diagnosed with respiratory  failure due to acute heart failure. He is s/p thoracentesis 10/26 (removed 1.7L).  He was found to have diffuse lymphadenopathy s/p lymph node biopsy that reveals adenocarcinoma (suspected primary is lung cancer).  CXR 10/29 reveals probable PNA Ralph patient ordered vancomycin Ralph zosyn per pharmacy for HCAP.   Vancomycin 1500mg  IV x 1 then 1250mg  IV q24h 10/29 >>vancomycin  >> 10/29 >>zosyn  >>    Tmax: afebrile WBCs: WNL Renal: SCr elevated & up slightly-2.47 (held lasix from 10/29), h/o CKD. Normalized CrCl ~ 57ml/min. UOP decreased ~0.35 ml/kg/hr (compared to 10/29)  10/26 pleural fluid: no growth-final 10/29 blood x2: ngtd  Drug level / dose changes info: 11/1 VT at 0930 = 20.6 before 4th total dose on 1250mg  q24  Goal of Therapy:  Vancomycin trough level  15-20 mcg/ml  Plan:   Reduce Vancomycin to 1000mg  q24  Continue to monitor renal fx Ralph urine output   Continued Zosyn 3.375gm IV q8h over 4h infusion  Minda Ditto PharmD Pager [phone] 09/17/2014, 10:51 AM

## 2014-09-17 NOTE — Progress Notes (Signed)
Per MD note- patient will most likely require SNF placement and CSW services are currently working on this.  Active bed search is in place- however barriers to placement at present are current lack of insurance and possible need for cancer treatments. A letter of guarantee can be provided to assist with placement of patient. CSW continues to assess for possible bed offers for patient.  No offers in place a this time.  Lorie Phenix. Pauline Good, [place]

## 2014-09-17 NOTE — Progress Notes (Signed)
[facility] PROGRESS NOTE  CID AGENA OIT:[id number] DOB: 1951/09/16 DOA: 09/10/2014 PCP: No PCP Per Patient  Brief Summary  63 year old male with past medical history of diabetes (managed with PO meds), anxiety, depression who presented to [facility] ED 09/10/2014 with worsening shortness of breath, dry cough, and leg swelling.  He also reported fatigue and weakness and feeling "really sick".  In ED, BP was 177/95 and oxygen saturation was 89% on room air.  CT angio chest neg for pulmonary embolism but showed extensive thoracic and upper abdominal adenopathy questionable nodule versus atelectasis in LEFT lower lobe, bilateral pleural effusions greater on the right.  He underwent thoracentesis and had a transudative effusion.  Duplex lower extremity was negative for DVT.  ECHO demonstrated EF of 20-25% in patient with no previous history of CAD.  Cardiology was consulted and he is undergoing diuresis.  He had excisional LN biopsy on 10/27 and oncology to consult based on pending pathology.  Assessment/Plan  Acute respiratory failure with hypoxia secondary to large right Malignant pleural effusion and on admission  secondary to acute systolic heart failure.  -  Thoracentesis perform by radiology on 10-26 yielding 1.7 L. Repeated thoracentesis 10-30 yielding 1.2 L.   -  pleural fluid culture and cytology: with malignant cell consistent with adenocarcinoma. No bacteria growth.  -  ECHO:  Mildly dilated LVF, severely reduced systolic function, EF of 58-30%, diffuse hypokinesis, and grade 1 DD, left atrium moderately dilated, small pericardial effusion -  Ferritin wnl, no globulin gap to suggest MM or amyloid  -  Started on ACEI, but creatinine increased so discontinued -  Started bidil TID -  Continue beta blocker -  Holding lasix due to worsening renal function.  -  Cardiology consulted for possible ischemic work up given ECG findings (possible previous septal infarct, lateral T-wave  inversions) -Feeling better today, dyspnea improved.    Acute Systolic HF; EF of 94-07% Compensated at this time. Holding lasix due to worsening renal function.   PNA; chest x ray 10-29 with bilateral infiltrates. started on vancomycin and zosyn. Day 4.   Metastatic Adenocarcinoma probably from Lung primary.  -Diffuse lymphadenopathy found incidentally on CT of the chest - Patient is reportedly up-to-date on his colon cancer and prostate screening  - CT of the abdomen and pelvis: Diffuse adenopathy, no evidence of mass - LDH 499 H, uric acid 7.2, hepatitis panel neg, HIV NR, beta-2 microglobulin pending, ESR 8 - Appreciate general surgery assistance: Excisional LN biopsy performed on 10/27 -pathology from lymph node biopsy showed adenocarcinoma.  -oncology consulted. Appreciate [name] evaluation. PSA 3.2 and CEA 4.8.  -Port cath placement 10-30 -MRI with multiple brain lesion stroke versus metastasis diseases. Plan to repeat MRI in the future.  -Received Chemo 10-31: Alimta.  -Tolerate chemo well.   Generalized weakness with progressive failure to thrive  -  PT/OT   Likely severe protein calorie malnutrition. Patient denies that he has had weight loss but his family states he has lost a lot of weight over the last year -  Liberalized diet -  Supplements -  Nutrition consultation  Hypertension, with elevated blood pressures -  Started on metoprolol  -  Add bidil -  Hold ACEI due to AKI  Nonsustained VT - Keep electrolytes within normal limits - On beta blocker.  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 Patient needs to follow up with  vascular outpatient. No need for anticoagulation.  ABI; 0.58, right, left 1.14.  duplex ordered.   Diabetes mellitus, hemoglobin A1c was previously around 12, currently 7.6, possibly due to weight loss, CBG mildly elevated despite NPO for procedure -  Diabetic  diet -  mod sliding-scale insulin with meals - will discontinue levemir, patient not eating enough.   Acute on CKD stage 3 due to diabetes and HTN plus recent IV contrast.  Significant proteinuria on exam -  Renally dose medications -  Minimize nephrotoxins  -  Urine protein-creatinine  -  lasix on hold today.  -Cr stable, follow trend.   Anxiety and depression, continue clonazepam and lexapro  Normocytic anemia, hgb approximately stable, likely due to chronic disease -  Iron studies wnl, b12 623, folate 1160, TSH 3.62 - erythropoietin level ordered.   Cigarette nicotine abuse and dependence with withdrawal.  Smokes a carton per week -  Smoking cessation counseling -  Nicotine patch  Diet:  diabetic Access:  PIV IVF:  off Proph:  lovenox  Code Status: full Family Communication: patient .  Disposition Plan: Need SNF.    Consultants:  IR   General surgery  [name].   Cardiology, Dr. [name]  Procedures:  CXR  CT angio chest  CT ab/p   US thoracentesis on 10/26  Antibiotics: Vancomycin Zosyn  HPI/Subjective: He has better spirit today. He is feeling better, denies dyspnea.    Objective: Filed Vitals:   09/16/14 1413 09/16/14 2306 09/17/14 0708 09/17/14 1403  BP: 132/65 124/58 153/72 108/52  Pulse: 85 88 92 87  Temp: 97.7 F (36.5 C) 97.8 F (36.6 C) 97.8 F (36.6 C) 98.3 F (36.8 C)  TempSrc: Oral Oral Oral Oral  Resp: _0 Height:      Weight:      SpO2: 96% 94% 95% 91%    Intake/Output Summary (Last 24 hours) at 09/17/14 1407 Last data filed at 09/17/14 1300  Gross per 24 hour  Intake    840 ml  Output    950 ml  Net   -110 ml   Filed Weights   09/15/14 0541 09/15/14 1805 09/16/14 0525  Weight: 84.4 kg (186 lb 1.1 oz) 84.369 kg (186 lb) 83.1 kg (183 lb 3.2 oz)    Exam:   General:  Mildly cachectic   HEENT: right neck dressing c/d/i  Cardiovascular:  RRR, nl S1, S2 no mrg  Respiratory:  Decreases breath  sound, mild crackles.   Abdomen:   NABS, soft, NT/ND  MSK:   Normal tone and bulk,   Data Reviewed: Basic Metabolic Panel:  Recent Labs Lab 09/10/14 1803  09/13/14 0325 09/14/14 0446 09/15/14 0405 09/16/14 0520 09/17/14 0630  NA 139  < > 143 140 141 142 141  K 3.6*  < > 4.0 4.4 4.2 4.2 4.8  CL 101  < > 105 102 103 104 103  CO2 27  < > _1 GLUCOSE 248*  < > 124* 123* 72 85 200*  BUN 22  < > 31* 35* 35* 31* 36*  CREATININE 1.44*  < > 2.63* 2.67* 2.46* 2.51* 2.47*  CALCIUM 8.1*  < > 7.8* 8.3* 8.4 8.0* 8.1*  MG 1.7  --   --   --   --   --   --   PHOS 3.6  --   --   --   --   --   --   < > =  values in this interval not displayed. Liver Function Tests:  Recent Labs Lab 09/10/14 1803 09/11/14 0400  AST 19 17  ALT 12 12  ALKPHOS 102 98  BILITOT 0.3 0.3  PROT 5.9* 5.5*  ALBUMIN 2.8* 2.6*   No results for input(s): LIPASE, AMYLASE in the last 168 hours. No results for input(s): AMMONIA in the last 168 hours. CBC:  Recent Labs Lab 09/10/14 1803  09/13/14 0325 09/14/14 0446 09/15/14 0405 09/16/14 0520 09/17/14 0630  WBC 9.3  < > 9.5 10.5 9.2 8.1 10.4  NEUTROABS 6.4  --   --   --   --   --   --   HGB 10.5*  < > 9.1* 10.0* 9.6* 8.8* 9.7*  HCT 30.9*  < > 26.8* 29.3* 29.0* 26.4* 29.2*  MCV 82.2  < > 85.6 84.0 82.4 84.9 83.4  PLT 170  < > 165 191 208 195 276  < > = values in this interval not displayed. Cardiac Enzymes:  Recent Labs Lab 09/11/14 2030 09/12/14 0035 09/12/14 0620  TROPONINI <0.30 <0.30 <0.30   BNP (last 3 results)  Recent Labs  09/12/14 0620  PROBNP 8587.0*   CBG:  Recent Labs Lab 09/16/14 0809 09/16/14 1225 09/16/14 1714 09/17/14 0810 09/17/14 1152  GLUCAP 84 113* 179* 244* 210*    Recent Results (from the past 240 hour(s))  Body fluid culture     Status: None   Collection Time: 09/11/14 10:28 AM  Result Value Ref Range Status   Specimen Description PLEURAL RIGHT  Final   Special Requests Immunocompromised  Final    Gram Stain   Final    NO WBC SEEN NO ORGANISMS SEEN Performed at [facility]   Culture   Final    NO GROWTH 3 DAYS Performed at [facility]   Report Status 09/14/2014 FINAL  Final  Surgical pcr screen     Status: Abnormal   Collection Time: 09/12/14  8:39 AM  Result Value Ref Range Status   MRSA, PCR NEGATIVE NEGATIVE Final   Staphylococcus aureus POSITIVE (A) NEGATIVE Final    Comment:        The Xpert SA Assay (FDA approved for NASAL specimens in patients over 23 years of age), is one component of a comprehensive surveillance program.  Test performance has been validated by [employer] for patients greater than or equal to 43 year old. It is not intended to diagnose infection nor to guide or monitor treatment.  Culture, blood (routine x 2)     Status: None (Preliminary result)   Collection Time: 09/14/14  3:09 PM  Result Value Ref Range Status   Specimen Description BLOOD LEFT ARM  Final   Special Requests BOTTLES DRAWN AEROBIC AND ANAEROBIC  10 CC  Final   Culture  Setup Time   Final    09/14/2014 22:53 Performed at [facility]   Culture   Final           BLOOD CULTURE RECEIVED NO GROWTH TO DATE CULTURE WILL BE HELD FOR 5 DAYS BEFORE ISSUING A FINAL NEGATIVE REPORT Performed at [facility]   Report Status PENDING  Incomplete  Culture, blood (routine x 2)     Status: None (Preliminary result)   Collection Time: 09/14/14  3:15 PM  Result Value Ref Range Status   Specimen Description BLOOD LEFT ARM  Final   Special Requests BOTTLES DRAWN AEROBIC AND ANAEROBIC  10 CC  Final   Culture  Setup  Time   Final    09/14/2014 22:55 Performed at [facility]   Culture   Final           BLOOD CULTURE RECEIVED NO GROWTH TO DATE CULTURE WILL BE HELD FOR 5 DAYS BEFORE ISSUING A FINAL NEGATIVE REPORT Performed at [facility]   Report Status PENDING  Incomplete     Studies: No results found.  Scheduled Meds: . sodium  chloride   Intravenous Once  . carvedilol  6.25 mg Oral BID WC  . Chlorhexidine Gluconate Cloth  6 each Topical Daily  . dexamethasone  8 mg Oral Q12H  . dronabinol  5 mg Oral BID AC  . enoxaparin (LOVENOX) injection  40 mg Subcutaneous Q24H  . escitalopram  10 mg Oral Daily  . feeding supplement (GLUCERNA SHAKE)  237 mL Oral TID BM  . folic acid  1 mg Oral Daily  . insulin aspart  0-15 Units Subcutaneous TID WC  . isosorbide-hydrALAZINE  1 tablet Oral TID  . mupirocin ointment  1 application Nasal BID  . nicotine  21 mg Transdermal Daily  . ondansetron (ZOFRAN) IV  8 mg Intravenous 3 times per day   Or  . ondansetron  4 mg Oral 3 times per day  . pamidronate  60 mg Intravenous Once  . pantoprazole (PROTONIX) IV  40 mg Intravenous Q24H  . piperacillin-tazobactam (ZOSYN)  IV  3.375 g Intravenous 3 times per day  . vancomycin  1,000 mg Intravenous Q24H   Continuous Infusions: . sodium chloride 10 mL/hr at 09/16/14 0215    Principal Problem:   Respiratory failure with hypoxia Active Problems:   Essential hypertension   Pleural effusion, right   Diabetes mellitus type 2, controlled, without complications   Anemia   Acute renal failure   Protein-calorie malnutrition, severe   Acute systolic congestive heart failure   Metastatic lung carcinoma    Time spent: 30 min    [place], [facility] Pager [phone]. If 7PM-7AM, please contact night-coverage at [email or web address], password [facility] 09/17/2014, 2:07 PM  LOS: 7 days

## 2014-09-18 DIAGNOSIS — J9601 Acute respiratory failure with hypoxia: Principal | ICD-10-CM

## 2014-09-18 LAB — CBC
HCT: 28.6 % — ABNORMAL LOW (ref 39.0–52.0)
Hemoglobin: 9.6 g/dL — ABNORMAL LOW (ref 13.0–17.0)
MCH: 27.7 pg (ref 26.0–34.0)
MCHC: 33.6 g/dL (ref 30.0–36.0)
MCV: 82.7 fL (ref 78.0–100.0)
PLATELETS: 266 10*3/uL (ref 150–400)
RBC: 3.46 MIL/uL — ABNORMAL LOW (ref 4.22–5.81)
RDW: 16.6 % — AB (ref 11.5–15.5)
WBC: 11.2 10*3/uL — ABNORMAL HIGH (ref 4.0–10.5)

## 2014-09-18 LAB — CLOSTRIDIUM DIFFICILE BY PCR: Toxigenic C. Difficile by PCR: NEGATIVE

## 2014-09-18 LAB — BASIC METABOLIC PANEL
ANION GAP: 12 (ref 5–15)
BUN: 37 mg/dL — AB (ref 6–23)
CO2: 24 mEq/L (ref 19–32)
Calcium: 8 mg/dL — ABNORMAL LOW (ref 8.4–10.5)
Chloride: 101 mEq/L (ref 96–112)
Creatinine, Ser: 2.3 mg/dL — ABNORMAL HIGH (ref 0.50–1.35)
GFR calc Af Amer: 33 mL/min — ABNORMAL LOW (ref >90)
GFR, EST NON AFRICAN AMERICAN: 29 mL/min — AB (ref >90)
Glucose, Bld: 218 mg/dL — ABNORMAL HIGH (ref 70–99)
Potassium: 4.5 mEq/L (ref 3.7–5.3)
Sodium: 137 mEq/L (ref 137–147)

## 2014-09-18 LAB — GLUCOSE, CAPILLARY
GLUCOSE-CAPILLARY: 193 mg/dL — AB (ref 70–99)
GLUCOSE-CAPILLARY: 199 mg/dL — AB (ref 70–99)
GLUCOSE-CAPILLARY: 264 mg/dL — AB (ref 70–99)
GLUCOSE-CAPILLARY: 196 mg/dL — AB (ref 70–99)

## 2014-09-18 LAB — ERYTHROPOIETIN: Erythropoietin: 14.9 m[IU]/mL (ref 2.6–18.5)

## 2014-09-18 MED ORDER — LOPERAMIDE HCL 2 MG PO CAPS: 2.0000 mg | ORAL_CAPSULE | ORAL | Status: DC | PRN

## 2014-09-18 NOTE — Progress Notes (Signed)
Clinical Social Work  CSW received a call from RN reporting that family wanted CSW to assist with notarizing advanced directives. CSW reviewed chart which stated that patient has been confused off and on. CSW met with patient and family at bedside and explained that patient would have to be alert and oriented and fully understanding of documents prior to paperwork being notarized.  CSW continues to assist with SNF placement as well. 57 day PASRR was received and CSW working with SNF for LOG placement. At this time, [facility] is the only facility that is considering patient but had questions about chemo/radiation. Per admissions, they do not feel that they can accept patient while receiving chemo or radiation. CSW continues to follow to work on SNF placement.  Sindy Messing, LCSW (Coverage for [employer])

## 2014-09-18 NOTE — Progress Notes (Signed)
Ralph [name] feels a little bit worse this morning. He just feels achy. He denies any increase of shortness of breath. He has very little nausea. He is on anti-nausea medicine on schedule.  He still is quite weak. He needs a lot of physical therapy.  He's had no bleeding. He's had no diarrheal doses stools are loose. We will see about readjusting anybowel medication.  His labs look okay.his creatinine is 2.3. His hemoglobin is 9.6. Glucose is little elevated at 218. Potassium is 4.5.  His vital signs look okay. His blood pressure is 140/84. Heart rate 81. Temperature 97.9. His lungs sound clear. Cardiac exam regular rate and rhythm. Abdomen is soft. Has hyper active bowel sounds. Extremities shows some trace edema.  Again, I think he is going to need some kind of skilled nursing facility. I just don't think that he is in any condition to go home.  At least, so far, he has tolerated his chemotherapy well.  I am still awaiting the genetic assays on his  Adenocarcinoma to see if he might actually qualify for one of our targeted drugs.  As always, the staff on [address] is doing a great job with him.  Laurey Arrow E  Psalms 25:4-5

## 2014-09-18 NOTE — Progress Notes (Signed)
OT Cancellation Note  Patient Details Name: Ralph [name] MRN: [id number] DOB: 17-Jan-1951   Cancelled Treatment:    Reason Eval/Treat Not Completed: Other (comment) Nursing in with pt to help with hygiene/washup from bed level. Will try back another time.  Pauline Aus Clayton 09/18/2014, 12:40 PM

## 2014-09-18 NOTE — Progress Notes (Signed)
Physical Therapy Treatment Patient Details Name: Ralph [name] MRN: [id number] DOB: 1951/09/03 Today's Date: Oct 04, 2014    History of Present Illness 63 yo came to ED with reports of increased weakness, BLE swelling, not feeling well, and SOB. Tests show B pleural effusion (R>L) with respiratory failure, DM2, Acute renal failure, and biopsy on 09/12/2014 showed lymph node is completely replaced by metastatic adenocarcinoma per oncology hospital note.     PT Comments    Pt assisted with ambulating in hallway on 3L O2 [place] however fatigues quickly and reports increase in back pain with activity so brought recliner behind pt.  Follow Up Recommendations  SNF     Equipment Recommendations  None recommended by PT    Recommendations for Other Services       Precautions / Restrictions Precautions Precautions: Fall    Mobility  Bed Mobility Overal bed mobility: Needs Assistance Bed Mobility: Supine to Sit     Supine to sit: Supervision     General bed mobility comments: supervision for lines  Transfers Overall transfer level: Needs assistance Equipment used: Rolling walker (2 wheeled) Transfers: Sit to/from Stand Sit to Stand: Min guard         General transfer comment: verbal cues for hand placement  Ambulation/Gait Ambulation/Gait assistance: Min assist Ambulation Distance (Feet): 80 Feet Assistive device: Rolling walker (2 wheeled) Gait Pattern/deviations: Step-through pattern;Narrow base of support;Decreased stride length     General Gait Details: pt fatigues quickly and required a couple standing breathing rest breaks, ambulated on 3L O2 [place] at 97%, pt min assist near end of ambulation due to fatigue and reports of LE weakness due to back pain so recliner brought behind pt   Stairs            Wheelchair Mobility    Modified Rankin (Stroke Patients Only)       Balance                                    Cognition Arousal/Alertness:  Awake/alert Behavior During Therapy: WFL for tasks assessed/performed Overall Cognitive Status: Within Functional Limits for tasks assessed                      Exercises      General Comments        Pertinent Vitals/Pain Pain Assessment: No/denies pain    Home Living                      Prior Function            PT Goals (current goals can now be found in the care plan section) Progress towards PT goals: Progressing toward goals    Frequency  Min 3X/week    PT Plan Current plan remains appropriate    Co-evaluation             End of Session   Activity Tolerance: Patient tolerated treatment well Patient left: in chair;with chair alarm set;with family/visitor present     Time: [id number] PT Time Calculation (min): 30 min  Charges:  $Gait Training: 23-37 mins                    G Codes:      Lunabelle Oatley,KATHrine E 2014-10-04, 3:10 PM Carmelia Bake, PT, DPT 2014-10-04 Pager: [phone]

## 2014-09-18 NOTE — Progress Notes (Signed)
Patient's C-diff PCR was negative.  Enteric isolation discontinued.  [place]  09/18/2014  6:18 PM

## 2014-09-18 NOTE — Progress Notes (Signed)
[facility] PROGRESS NOTE  Ralph [name] BLT:[id number] DOB: 1951-07-10 DOA: 09/10/2014 PCP: No PCP Per Patient  Brief Summary  63 year old male with past medical history of diabetes (managed with PO meds), anxiety, depression who presented to [facility] ED 09/10/2014 with worsening shortness of breath, dry cough, and leg swelling.  He also reported fatigue and weakness and feeling "really sick".  In ED, BP was 177/95 and oxygen saturation was 89% on room air.  CT angio chest neg for pulmonary embolism but showed extensive thoracic and upper abdominal adenopathy questionable nodule versus atelectasis in LEFT lower lobe, bilateral pleural effusions greater on the right.  He underwent thoracentesis and had a transudative effusion.  Duplex lower extremity was negative for DVT.  ECHO demonstrated EF of 20-25% in patient with no previous history of CAD.  Cardiology was consulted and he is undergoing diuresis.  He had excisional LN biopsy on 10/27 and oncology to consult based on pending pathology.  Assessment/Plan  Acute respiratory failure with hypoxia secondary to large right Malignant pleural effusion and on admission  secondary to acute systolic heart failure.  -  Thoracentesis perform by radiology on 10-26 yielding 1.7 L. Repeated thoracentesis 10-30 yielding 1.2 L.   -  pleural fluid culture and cytology: with malignant cell consistent with adenocarcinoma. No bacteria growth.  -  ECHO:  Mildly dilated LVF, severely reduced systolic function, EF of 00-76%, diffuse hypokinesis, and grade 1 DD, left atrium moderately dilated, small pericardial effusion -  Ferritin wnl, no globulin gap to suggest MM or amyloid  -  Started on ACEI, but creatinine increased so discontinued -  Started bidil TID -  Continue beta blocker -  Holding lasix due to worsening renal function.  -  Cardiology consulted for possible ischemic work up given ECG findings (possible previous septal infarct, lateral T-wave  inversions) -denies dyspnea.   Diarrhea; check for C diff.   Nausea; PRN Zofran.   Acute Systolic HF; EF of 22-63% Compensated at this time. Holding lasix due to worsening renal function.   PNA; chest x ray 10-29 with bilateral infiltrates. started on vancomycin and zosyn. Day 5.  Will transition to oral antibiotics 11-03  Metastatic Adenocarcinoma probably from Lung primary.  -Diffuse lymphadenopathy found incidentally on CT of the chest - Patient is reportedly up-to-date on his colon cancer and prostate screening  - CT of the abdomen and pelvis: Diffuse adenopathy, no evidence of mass - LDH 499 H, uric acid 7.2, hepatitis panel neg, HIV NR, beta-2 microglobulin pending, ESR 8 - Appreciate general surgery assistance: Excisional LN biopsy performed on 10/27 -pathology from lymph node biopsy showed adenocarcinoma.  -oncology consulted. Appreciate [name] evaluation. PSA 3.2 and CEA 4.8.  -Port cath placement 10-30 -MRI with multiple brain lesion stroke versus metastasis diseases. Plan to repeat MRI in the future.  -Received Chemo 10-31: Alimta.  -Tolerate chemo well.   Generalized weakness with progressive failure to thrive  -  PT/OT  -need SNF  Likely severe protein calorie malnutrition. Patient denies that he has had weight loss but his family states he has lost a lot of weight over the last year -  Liberalized diet -  Supplements -  Nutrition consultation  Hypertension, with elevated blood pressures -  Started on metoprolol  -  Add bidil -  Hold ACEI due to AKI  Nonsustained VT - Keep electrolytes within normal limits - On beta blocker.  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 Patient needs to follow up with vascular outpatient. No need for anticoagulation.  ABI; 0.58, right, left 1.14.  duplex ordered.   Diabetes mellitus, hemoglobin A1c was previously around 12, currently 7.6,  possibly due to weight loss, CBG mildly elevated despite NPO for procedure -  Diabetic diet -  mod sliding-scale insulin with meals - will discontinue levemir, patient not eating enough.   Acute on CKD stage 3 due to diabetes and HTN plus recent IV contrast.  Significant proteinuria on exam -  Renally dose medications -  Minimize nephrotoxins  -  Urine protein-creatinine  -  lasix on hold today.  -  Cr  Trending down.    Anxiety and depression, continue clonazepam and lexapro  Normocytic anemia, hgb approximately stable, likely due to chronic disease -  Iron studies wnl, b12 623, folate 1160, TSH 3.62 - erythropoietin level ordered.   Cigarette nicotine abuse and dependence with withdrawal.  Smokes a carton per week -  Smoking cessation counseling -  Nicotine patch  Diet:  diabetic Access:  PIV IVF:  off Proph:  lovenox  Code Status: full Family Communication: patient .  Disposition Plan: Need SNF.    Consultants:  IR   General surgery  [name].   Cardiology, Dr. [name]  Procedures:  CXR  CT angio chest  CT ab/p   US thoracentesis on 10/26  Antibiotics: Vancomycin Zosyn  HPI/Subjective: Sleepy but wake up and answer questions. Try to walk today with PT, got tired.  Denies worsening dyspnea. Had 3 loose stool overnight. Nausea when he eats.    Objective: Filed Vitals:   09/17/14 1403 09/17/14 2041 09/17/14 2100 09/18/14 0442  BP: 108/52 135/72  140/84  Pulse: 87 83  81  Temp: 98.3 F (36.8 C) 98.2 F (36.8 C)  97.9 F (36.6 C)  TempSrc: Oral Oral  Oral  Resp: 16 16  16   Height:      Weight:   85.5 kg (188 lb 7.9 oz) 87.8 kg (193 lb 9 oz)  SpO2: 91% 100%  95%    Intake/Output Summary (Last 24 hours) at 09/18/14 1243 Last data filed at 09/18/14 0445  Gross per 24 hour  Intake    240 ml  Output    600 ml  Net   -360 ml   Filed Weights   09/16/14 0525 09/17/14 2100 09/18/14 0442  Weight: 83.1 kg (183 lb 3.2 oz) 85.5 kg (188 lb  7.9 oz) 87.8 kg (193 lb 9 oz)    Exam:   General:  Mildly cachectic   HEENT: right neck dressing c/d/i  Cardiovascular:  RRR, nl S1, S2 no mrg  Respiratory:  Decreases breath sound, mild crackles.   Abdomen:   NABS, soft, NT/ND  MSK:   Normal tone and bulk,   Data Reviewed: Basic Metabolic Panel:  Recent Labs Lab 09/14/14 0446 09/15/14 0405 09/16/14 0520 09/17/14 0630 09/18/14 0545  NA 140 141 142 141 137  K 4.4 4.2 4.2 4.8 4.5  CL 102 103 104 103 101  CO2 25 25 27 21 24   GLUCOSE 123* 72 85 200* 218*  BUN 35* 35* 31* 36* 37*  CREATININE 2.67* 2.46* 2.51* 2.47* 2.30*  CALCIUM 8.3* 8.4 8.0* 8.1* 8.0*   Liver Function Tests: No results for input(s): AST, ALT, ALKPHOS, BILITOT, PROT, ALBUMIN in the last 168 hours. No results for input(s): LIPASE, AMYLASE in the last 168 hours. No results for input(s):  AMMONIA in the last 168 hours. CBC:  Recent Labs Lab 09/14/14 0446 09/15/14 0405 09/16/14 0520 09/17/14 0630 09/18/14 0545  WBC 10.5 9.2 8.1 10.4 11.2*  HGB 10.0* 9.6* 8.8* 9.7* 9.6*  HCT 29.3* 29.0* 26.4* 29.2* 28.6*  MCV 84.0 82.4 84.9 83.4 82.7  PLT 191 208 195 276 266   Cardiac Enzymes:  Recent Labs Lab 09/11/14 2030 09/12/14 0035 09/12/14 0620  TROPONINI <0.30 <0.30 <0.30   BNP (last 3 results)  Recent Labs  09/12/14 0620  PROBNP 8587.0*   CBG:  Recent Labs Lab 09/17/14 1152 09/17/14 1650 09/17/14 2142 09/18/14 0757 09/18/14 1140  GLUCAP 210* 170* 154* 193* 199*    Recent Results (from the past 240 hour(s))  Body fluid culture     Status: None   Collection Time: 09/11/14 10:28 AM  Result Value Ref Range Status   Specimen Description PLEURAL RIGHT  Final   Special Requests Immunocompromised  Final   Gram Stain   Final    NO WBC SEEN NO ORGANISMS SEEN Performed at [facility]   Culture   Final    NO GROWTH 3 DAYS Performed at [facility]   Report Status 09/14/2014 FINAL  Final  Surgical pcr screen      Status: Abnormal   Collection Time: 09/12/14  8:39 AM  Result Value Ref Range Status   MRSA, PCR NEGATIVE NEGATIVE Final   Staphylococcus aureus POSITIVE (A) NEGATIVE Final    Comment:        The Xpert SA Assay (FDA approved for NASAL specimens in patients over 1 years of age), is one component of a comprehensive surveillance program.  Test performance has been validated by [employer] for patients greater than or equal to 57 year old. It is not intended to diagnose infection nor to guide or monitor treatment.  Culture, blood (routine x 2)     Status: None (Preliminary result)   Collection Time: 09/14/14  3:09 PM  Result Value Ref Range Status   Specimen Description BLOOD LEFT ARM  Final   Special Requests BOTTLES DRAWN AEROBIC AND ANAEROBIC  10 CC  Final   Culture  Setup Time   Final    09/14/2014 22:53 Performed at [facility]   Culture   Final           BLOOD CULTURE RECEIVED NO GROWTH TO DATE CULTURE WILL BE HELD FOR 5 DAYS BEFORE ISSUING A FINAL NEGATIVE REPORT Performed at [facility]   Report Status PENDING  Incomplete  Culture, blood (routine x 2)     Status: None (Preliminary result)   Collection Time: 09/14/14  3:15 PM  Result Value Ref Range Status   Specimen Description BLOOD LEFT ARM  Final   Special Requests BOTTLES DRAWN AEROBIC AND ANAEROBIC  10 CC  Final   Culture  Setup Time   Final    09/14/2014 22:55 Performed at [facility]   Culture   Final           BLOOD CULTURE RECEIVED NO GROWTH TO DATE CULTURE WILL BE HELD FOR 5 DAYS BEFORE ISSUING A FINAL NEGATIVE REPORT Performed at [facility]   Report Status PENDING  Incomplete     Studies: No results found.  Scheduled Meds: . sodium chloride   Intravenous Once  . carvedilol  6.25 mg Oral BID WC  . Chlorhexidine Gluconate Cloth  6 each Topical Daily  . dexamethasone  8 mg Oral Q12H  . dronabinol  5 mg Oral BID AC  . enoxaparin (LOVENOX) injection  40 mg  Subcutaneous Q24H  . escitalopram  10 mg Oral Daily  . feeding supplement (GLUCERNA SHAKE)  237 mL Oral TID BM  . folic acid  1 mg Oral Daily  . insulin aspart  0-15 Units Subcutaneous TID WC  . isosorbide-hydrALAZINE  1 tablet Oral TID  . mupirocin ointment  1 application Nasal BID  . nicotine  21 mg Transdermal Daily  . ondansetron (ZOFRAN) IV  8 mg Intravenous 3 times per day   Or  . ondansetron  4 mg Oral 3 times per day  . pantoprazole (PROTONIX) IV  40 mg Intravenous Q24H  . piperacillin-tazobactam (ZOSYN)  IV  3.375 g Intravenous 3 times per day  . vancomycin  1,000 mg Intravenous Q24H   Continuous Infusions: . sodium chloride 10 mL/hr at 09/16/14 0215    Principal Problem:   Respiratory failure with hypoxia Active Problems:   Essential hypertension   Pleural effusion, right   Diabetes mellitus type 2, controlled, without complications   Anemia   Acute renal failure   Protein-calorie malnutrition, severe   Acute systolic congestive heart failure   Metastatic lung carcinoma    Time spent: 30 min    Kiefer Opheim, [facility] Pager [phone]. If 7PM-7AM, please contact night-coverage at [email or web address], password [facility] 09/18/2014, 12:43 PM  LOS: 8 days

## 2014-09-19 ENCOUNTER — Inpatient Hospital Stay (HOSPITAL_COMMUNITY): Payer: Medicaid Other

## 2014-09-19 DIAGNOSIS — N189 Chronic kidney disease, unspecified: Secondary | ICD-10-CM | POA: Insufficient documentation

## 2014-09-19 LAB — CBC
HCT: 28.5 % — ABNORMAL LOW (ref 39.0–52.0)
Hemoglobin: 9.4 g/dL — ABNORMAL LOW (ref 13.0–17.0)
MCH: 27.4 pg (ref 26.0–34.0)
MCHC: 33 g/dL (ref 30.0–36.0)
MCV: 83.1 fL (ref 78.0–100.0)
PLATELETS: 245 10*3/uL (ref 150–400)
RBC: 3.43 MIL/uL — AB (ref 4.22–5.81)
RDW: 16.5 % — ABNORMAL HIGH (ref 11.5–15.5)
WBC: 9.9 10*3/uL (ref 4.0–10.5)

## 2014-09-19 LAB — BASIC METABOLIC PANEL
ANION GAP: 12 (ref 5–15)
BUN: 36 mg/dL — ABNORMAL HIGH (ref 6–23)
CO2: 25 mEq/L (ref 19–32)
Calcium: 7.8 mg/dL — ABNORMAL LOW (ref 8.4–10.5)
Chloride: 101 mEq/L (ref 96–112)
Creatinine, Ser: 2.1 mg/dL — ABNORMAL HIGH (ref 0.50–1.35)
GFR calc non Af Amer: 32 mL/min — ABNORMAL LOW (ref >90)
GFR, EST AFRICAN AMERICAN: 37 mL/min — AB (ref >90)
Glucose, Bld: 217 mg/dL — ABNORMAL HIGH (ref 70–99)
POTASSIUM: 4.7 meq/L (ref 3.7–5.3)
Sodium: 138 mEq/L (ref 137–147)

## 2014-09-19 LAB — GLUCOSE, CAPILLARY
GLUCOSE-CAPILLARY: 202 mg/dL — AB (ref 70–99)
Glucose-Capillary: 194 mg/dL — ABNORMAL HIGH (ref 70–99)
Glucose-Capillary: 217 mg/dL — ABNORMAL HIGH (ref 70–99)
Glucose-Capillary: 199 mg/dL — ABNORMAL HIGH (ref 70–99)

## 2014-09-19 MED ORDER — PANTOPRAZOLE SODIUM 40 MG PO TBEC
40.0000 mg | DELAYED_RELEASE_TABLET | Freq: Every day | ORAL | Status: DC
  Administered 2014-09-19 – 2014-09-21 (×3): 40 mg via ORAL
  Filled 2014-09-19 (×3): qty 1

## 2014-09-19 MED ORDER — LEVOFLOXACIN 750 MG PO TABS
750.0000 mg | ORAL_TABLET | ORAL | Status: DC
  Administered 2014-09-19: 750 mg via ORAL
  Filled 2014-09-19 (×2): qty 1

## 2014-09-19 MED ORDER — INSULIN DETEMIR 100 UNIT/ML ~~LOC~~ SOLN
5.0000 [IU] | Freq: Every day | SUBCUTANEOUS | Status: DC
  Administered 2014-09-19 – 2014-09-20 (×2): 5 [IU] via SUBCUTANEOUS
  Filled 2014-09-19 (×3): qty 0.05

## 2014-09-19 NOTE — Progress Notes (Signed)
NUTRITION FOLLOW UP  Intervention:   CHO MOD diet Glucerna tid Encouraged intake RD to follow  Nutrition Dx:   Increased nutrient needs (protein/kcal) related to chronic disease as evidenced by likely lymphoma, weight loss, FTT. -continues  Goal:   Patient to meet >/=90% of their estimated nutrition needs.  Monitor:   Total protein/energy intake, labs, weights, education needs, glucose profile.  Assessment:   Patient sleeping.  Visitor in room.  States that patient ate breakfast but otherwise has been sleeping most of the day.  Intake remains inadequate to meet estimated needs.  Patient awoke and stated that he is drinking the supplements.  Height: Ht Readings from Last 1 Encounters:  09/15/14 6' (1.829 m)    Weight Status:   Wt Readings from Last 1 Encounters:  09/19/14 197 lb 12 oz (89.7 kg)    Re-estimated needs:  Kcal: 2300-2500 Protein: 100-110 gm Fluid: >/=2300 ml daily  Skin: WDL  Diet Order: Diet Carb Modified   Intake/Output Summary (Last 24 hours) at 09/19/14 1647 Last data filed at 09/19/14 1230  Gross per 24 hour  Intake    720 ml  Output   1225 ml  Net   -505 ml     Labs:   Recent Labs Lab 09/17/14 0630 09/18/14 0545 09/19/14 0556  NA 141 137 138  K 4.8 4.5 4.7  CL 103 101 101  CO2 21 24 25   BUN 36* 37* 36*  CREATININE 2.47* 2.30* 2.10*  CALCIUM 8.1* 8.0* 7.8*  GLUCOSE 200* 218* 217*    CBG (last 3)   Recent Labs  09/18/14 2253 09/19/14 0744 09/19/14 1130  GLUCAP 196* 194* 217*    Scheduled Meds: . sodium chloride   Intravenous Once  . carvedilol  6.25 mg Oral BID WC  . Chlorhexidine Gluconate Cloth  6 each Topical Daily  . dexamethasone  8 mg Oral Q12H  . dronabinol  5 mg Oral BID AC  . enoxaparin (LOVENOX) injection  40 mg Subcutaneous Q24H  . escitalopram  10 mg Oral Daily  . feeding supplement (GLUCERNA SHAKE)  237 mL Oral TID BM  . folic acid  1 mg Oral Daily  . insulin aspart  0-15 Units Subcutaneous TID WC  .  insulin detemir  5 Units Subcutaneous QHS  . isosorbide-hydrALAZINE  1 tablet Oral TID  . levofloxacin  750 mg Oral Q48H  . mupirocin ointment  1 application Nasal BID  . nicotine  21 mg Transdermal Daily  . ondansetron (ZOFRAN) IV  8 mg Intravenous 3 times per day   Or  . ondansetron  4 mg Oral 3 times per day  . pantoprazole  40 mg Oral Daily    Continuous Infusions: . sodium chloride 10 mL/hr at 09/16/14 0215    Antonieta Iba, RD, LDN Clinical Inpatient Dietitian Pager:  [phone] Weekend and after hours pager:  [phone]

## 2014-09-19 NOTE — Progress Notes (Signed)
Key Points: Use following P&T approved IV to PO change policy.  Description contains the criteria that are approved Note: Policy Excludes:  Esophagectomy patientsPHARMACIST - PHYSICIAN COMMUNICATION DR:   Marin Olp CONCERNING: IV to Oral Route Change Policy  RECOMMENDATION: This patient is receiving Protonix by the intravenous route.  Based on criteria approved by the Pharmacy and Therapeutics Committee, the intravenous medication(s) is/are being converted to the equivalent oral dose form(s).   DESCRIPTION: These criteria include:  The patient is eating (either orally or via tube) and/or has been taking other orally administered medications for a least 24 hours  The patient has no evidence of active gastrointestinal bleeding or impaired GI absorption (gastrectomy, short bowel, patient on TNA or NPO).  If you have questions about this conversion, please contact the Pharmacy Department  []   [phone] )  [facility] []   [phone] )  [facility]ac[facility] Pontes  []   [phone] )  [facility] [x]   [phone] )  [place], [place], [facility] 09/19/2014 9:52 AM

## 2014-09-19 NOTE — Progress Notes (Signed)
ANTIBIOTIC CONSULT NOTE - INITIAL  Pharmacy Consult for Levaquin (PO) Indication: HCAP  No Known Allergies  Patient Measurements: Height: 6' (182.9 cm) Weight: 197 lb 12 oz (89.7 kg) IBW/kg (Calculated) : 77.6 Vital Signs: Temp: 98.5 F (36.9 C) (11/03 1340) Temp Source: Oral (11/03 1340) BP: 130/67 mmHg (11/03 1340) Pulse Rate: 84 (11/03 1340) Intake/Output from previous day: 11/02 0701 - 11/03 0700 In: 570 [P.O.:240; I.V.:80; IV Piggyback:250] Out: 1175 [Urine:1175] Intake/Output from this shift: Total I/O In: 600 [P.O.:600] Out: 500 [Urine:500]  Labs:  Recent Labs  09/17/14 0630 09/18/14 0545 09/19/14 0556  WBC 10.4 11.2* 9.9  HGB 9.7* 9.6* 9.4*  PLT 276 266 245  CREATININE 2.47* 2.30* 2.10*   Estimated Creatinine Clearance: 40 mL/min (by C-G formula based on Cr of 2.1).  Recent Labs  09/17/14 0930  VANCOTROUGH 20.6*    Microbiology: Recent Results (from the past 720 hour(s))  Body fluid culture     Status: None   Collection Time: 09/11/14 10:28 AM  Result Value Ref Range Status   Specimen Description PLEURAL RIGHT  Final   Special Requests Immunocompromised  Final   Gram Stain   Final    NO WBC SEEN NO ORGANISMS SEEN Performed at [facility]   Culture   Final    NO GROWTH 3 DAYS Performed at [facility]   Report Status 09/14/2014 FINAL  Final  Surgical pcr screen     Status: Abnormal   Collection Time: 09/12/14  8:39 AM  Result Value Ref Range Status   MRSA, PCR NEGATIVE NEGATIVE Final   Staphylococcus aureus POSITIVE (A) NEGATIVE Final    Comment:        The Xpert SA Assay (FDA approved for NASAL specimens in patients over 59 years of age), is one component of a comprehensive surveillance program.  Test performance has been validated by [employer] for patients greater than or equal to 67 year old. It is not intended to diagnose infection nor to guide or monitor treatment.  Culture, blood (routine x 2)     Status:  None (Preliminary result)   Collection Time: 09/14/14  3:09 PM  Result Value Ref Range Status   Specimen Description BLOOD LEFT ARM  Final   Special Requests BOTTLES DRAWN AEROBIC AND ANAEROBIC  10 CC  Final   Culture  Setup Time   Final    09/14/2014 22:53 Performed at [facility]   Culture   Final           BLOOD CULTURE RECEIVED NO GROWTH TO DATE CULTURE WILL BE HELD FOR 5 DAYS BEFORE ISSUING A FINAL NEGATIVE REPORT Performed at [facility]   Report Status PENDING  Incomplete  Culture, blood (routine x 2)     Status: None (Preliminary result)   Collection Time: 09/14/14  3:15 PM  Result Value Ref Range Status   Specimen Description BLOOD LEFT ARM  Final   Special Requests BOTTLES DRAWN AEROBIC AND ANAEROBIC  10 CC  Final   Culture  Setup Time   Final    09/14/2014 22:55 Performed at [facility]   Culture   Final           BLOOD CULTURE RECEIVED NO GROWTH TO DATE CULTURE WILL BE HELD FOR 5 DAYS BEFORE ISSUING A FINAL NEGATIVE REPORT Performed at [facility]   Report Status PENDING  Incomplete  Clostridium Difficile by PCR     Status: None   Collection Time: 09/18/14  2:42 PM  Result Value Ref Range Status   C difficile by pcr NEGATIVE NEGATIVE Final    Comment: Performed at [facility]   Assessment: Ralph [name] presented 10/25 with SOB, fatigue, and not feeling well.  Diagnosed with respiratory failure due to acute heart failure. He is s/p thoracentesis 10/26 (removed 1.7L).  He was found to have diffuse lymphadenopathy s/p lymph node biopsy that reveals adenocarcinoma (suspected primary is lung cancer).  CXR 10/29 reveals probable PNA and patient ordered vancomycin and zosyn per pharmacy for HCAP.   Vancomycin 1500mg  IV x 1 then 1250mg  IV q24h 10/29 >>vancomycin  >> 11/3 10/29 >>zosyn  >>  11/3 11/3 >> PO levafloxacin >>  Tmax: afebrile WBCs: WNL Renal: SCr -2.10 , h/o CKD. CrCl ~ 37ml/min.   10/26 pleural fluid: no  growth-final 10/29 blood x2: ngtd  Vancomycin and Zosyn have been d/c'ed and pharmacy consulted to dose PO Levaquin for HCAP  Goal of Therapy:  Eradication of infection  Plan:   Levaquin 750mg  po q48h  Leone Haven, PharmD  09/19/2014, 2:47 PM

## 2014-09-19 NOTE — Progress Notes (Signed)
Mr. Kuiken looks a little bit better. He says that he ate a little bit more yesterday.  He did do some walking yesterday.  He denies any type of nausea or vomiting.  He has no shortness of breath with rest. It looks like with physical therapy, he did get fatigued.  He has no problem with mouth sores.  He says is not hurting when he is lying down.  I talked to him about discharge. I really don't think that he is able to go home. He lives by himself. He wants to go home.however, I just don't think that he will be able to manage by himself. I will think he would be up to feed himself. He needs oxygen. He needs breathing treatments. He needs physical therapy. I think try to do all this at home would be very very difficult for him.  His labs look okay. His renal function continues to improve. His creatinine is now down to 2.1. His hemoglobin is 9.4.  His erythropoietin level is only 15. I think he would benefit from Aranesp. I will have to talk to about this.  On his physical exam, his blood pressure is 155/80. Pulse is 84. Temperature is 97.7. His lungs sound pretty clear bilaterally. There may be some wheezes. Cardiac exam is regular rate and rhythm. He has no murmurs, rubs or bruits. Abdomen is soft. Bowel sounds are active. External he shows no clubbing, cyanosis or edema.  I think the problem now is going to be discharge. Again, I think he needs to go to skilled nursing. He has other ideas about this.  I would get a chest x-ray on him. I would like to see if the pleural effusion is coming back.  I will see about Aranesp tomorrow for his anemia.  I very much appreciate the great care cared that he is getting by all the staff on [place] 3:23

## 2014-09-19 NOTE — Progress Notes (Signed)
[facility] PROGRESS NOTE  Ralph [name] RFF:[id number] DOB: 04-18-1951 DOA: 09/10/2014 PCP: No PCP Per Patient  Brief Summary  63 year old male with past medical history of diabetes (managed with PO meds), anxiety, depression who presented to [facility] ED 09/10/2014 with worsening shortness of breath, dry cough, and leg swelling.  He also reported fatigue and weakness and feeling "really sick".  In ED, BP was 177/95 and oxygen saturation was 89% on room air.  CT angio chest neg for pulmonary embolism but showed extensive thoracic and upper abdominal adenopathy questionable nodule versus atelectasis in LEFT lower lobe, bilateral pleural effusions greater on the right.  He underwent thoracentesis and had a transudative effusion.  Duplex lower extremity was negative for DVT.  ECHO demonstrated EF of 20-25% in patient with no previous history of CAD.  Cardiology was consulted and he is undergoing diuresis.  He had excisional LN biopsy on 10/27. Pathology consistent with adenocarcinoma, probably from lung. Patient received first cycle of chemotherapy. He is on antibiotics to cover Health care associated PNA.   Assessment/Plan Acute respiratory failure with hypoxia secondary to large right Malignant pleural effusion and on admission  secondary to acute systolic heart failure.  -  Thoracentesis perform by radiology on 10-26 yielding 1.7 L. Repeated thoracentesis 10-30 yielding 1.2 L.   -  pleural fluid culture and cytology: with malignant cell consistent with adenocarcinoma. No bacteria growth.  -  ECHO:  Mildly dilated LVF, severely reduced systolic function, EF of 35-70%, diffuse hypokinesis, and grade 1 DD, left atrium moderately dilated, small pericardial effusion -  Ferritin wnl, no globulin gap to suggest MM or amyloid  -  Started on ACEI, but creatinine increased so discontinued. -  Started bidil TID -  Continue beta blocker -  Holding lasix due to worsening renal function.   Malignant right   Pleural Effusion:  Recurrent effusion on chest x ray. Patient denies significant dyspnea.  Will likely need thoracentesis by [place], IR Consulted.   Diarrhea;resolved. C diff negative.   Nausea; PRN Zofran.   Acute Systolic HF; EF of 17-79% Compensated at this time. Holding lasix due to worsening renal function.  Cardiology consulted for possible ischemic work up given ECG findings (possible previous septal infarct, lateral T-wave inversions). No further cardiac evaluation.   PNA; chest x ray 10-29 with bilateral infiltrates. Received vancomycin and zosyn for 6 days.  Change to Levaquin 11-03. Total day of antibiotics 6.   Metastatic Adenocarcinoma probably from Lung primary.  -Diffuse lymphadenopathy found incidentally on CT of the chest - Patient is reportedly up-to-date on his colon cancer and prostate screening  - CT of the abdomen and pelvis: Diffuse adenopathy, no evidence of mass - LDH 499 H, uric acid 7.2, hepatitis panel neg, HIV NR, beta-2 microglobulin pending, ESR 8 - Appreciate general surgery assistance: Excisional LN biopsy performed on 10/27 -pathology from lymph node biopsy showed adenocarcinoma.  -oncology consulted. Appreciate [name] evaluation. PSA 3.2 and CEA 4.8.  -Port cath placement 10-30 -MRI with multiple brain lesion stroke versus metastasis diseases. Plan to repeat MRI in the future.  -Received Chemo 10-31: Alimta.  -Tolerate chemo well.   Generalized weakness with progressive failure to thrive  -  PT/OT  -Need SNF  Likely severe protein calorie malnutrition. Patient denies that he has had weight loss but his family states he has lost a lot of weight over the last year -  Liberalized diet -  Supplements -  Nutrition consultation  Hypertension, with elevated  blood pressures -  Started on metoprolol  -  Add bidil -  Hold ACEI due to AKI  Nonsustained VT - Keep electrolytes within normal limits - On beta blocker.  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 Patient needs to follow up with vascular outpatient. No need for anticoagulation.  ABI; 0.58, right, left 1.14.  duplex ordered.   Diabetes mellitus, hemoglobin A1c was previously around 12, currently 7.6, possibly due to weight loss, CBG mildly elevated despite NPO for procedure -  Diabetic diet -  mod sliding-scale insulin with meals - will discontinue levemir, patient not eating enough.   Acute on CKD stage 3 due to diabetes and HTN plus recent IV contrast.  Significant proteinuria on exam -  Renally dose medications -  Minimize nephrotoxins  -  Urine protein-creatinine  -  lasix on hold today.  -  Cr  Trending down.    Anxiety and depression, continue clonazepam and lexapro  Normocytic anemia, hgb approximately stable, likely due to chronic disease -  Iron studies wnl, b12 623, folate 1160, TSH 3.62 - erythropoietin level ordered.   Cigarette nicotine abuse and dependence with withdrawal.  Smokes a carton per week -  Smoking cessation counseling -  Nicotine patch  Diet:  diabetic Access:  PIV IVF:  off Proph:  lovenox  Code Status: full Family Communication: patient .  Disposition Plan: Need SNF.    Consultants:  IR   General surgery  [name].   Cardiology, Dr. [name]  Procedures:  CXR  CT angio chest  CT ab/p   US thoracentesis on 10/26  Antibiotics: Vancomycin Zosyn  HPI/Subjective: Feeling well, more alert, no worsening dyspnea.    Objective: Filed Vitals:   09/18/14 1414 09/18/14 2159 09/19/14 0514 09/19/14 1340  BP: 150/81 138/76 155/80 130/67  Pulse: 85 85 84 84  Temp: 97.7 F (36.5 C) 98 F (36.7 C) 97.7 F (36.5 C) 98.5 F (36.9 C)  TempSrc: Oral Oral Oral Oral  Resp: 16 16  18   Height:      Weight:   89.7 kg (197 lb 12 oz)   SpO2: 98% 100% 99% 100%    Intake/Output Summary (Last 24 hours) at 09/19/14 1438 Last data filed  at 09/19/14 1230  Gross per 24 hour  Intake    720 ml  Output   1575 ml  Net   -855 ml   Filed Weights   09/17/14 2100 09/18/14 0442 09/19/14 0514  Weight: 85.5 kg (188 lb 7.9 oz) 87.8 kg (193 lb 9 oz) 89.7 kg (197 lb 12 oz)    Exam:   General:  Mildly cachectic   HEENT: right neck dressing c/d/i  Cardiovascular:  RRR, nl S1, S2 no mrg  Respiratory:  Decreases breath sound, mild crackles.   Abdomen:   NABS, soft, NT/ND  MSK:   Normal tone and bulk,   Data Reviewed: Basic Metabolic Panel:  Recent Labs Lab 09/15/14 0405 09/16/14 0520 09/17/14 0630 09/18/14 0545 09/19/14 0556  NA 141 142 141 137 138  K 4.2 4.2 4.8 4.5 4.7  CL 103 104 103 101 101  CO2 25 27 21 24 25   GLUCOSE 72 85 200* 218* 217*  BUN 35* 31* 36* 37* 36*  CREATININE 2.46* 2.51* 2.47* 2.30* 2.10*  CALCIUM 8.4 8.0* 8.1* 8.0* 7.8*   Liver Function Tests: No results for input(s): AST, ALT, ALKPHOS, BILITOT, PROT,  ALBUMIN in the last 168 hours. No results for input(s): LIPASE, AMYLASE in the last 168 hours. No results for input(s): AMMONIA in the last 168 hours. CBC:  Recent Labs Lab 09/15/14 0405 09/16/14 0520 09/17/14 0630 09/18/14 0545 09/19/14 0556  WBC 9.2 8.1 10.4 11.2* 9.9  HGB 9.6* 8.8* 9.7* 9.6* 9.4*  HCT 29.0* 26.4* 29.2* 28.6* 28.5*  MCV 82.4 84.9 83.4 82.7 83.1  PLT 208 195 276 266 245   Cardiac Enzymes: No results for input(s): CKTOTAL, CKMB, CKMBINDEX, TROPONINI in the last 168 hours. BNP (last 3 results)  Recent Labs  09/12/14 0620  PROBNP 8587.0*   CBG:  Recent Labs Lab 09/18/14 1140 09/18/14 1724 09/18/14 2253 09/19/14 0744 09/19/14 1130  GLUCAP 199* 264* 196* 194* 217*    Recent Results (from the past 240 hour(s))  Body fluid culture     Status: None   Collection Time: 09/11/14 10:28 AM  Result Value Ref Range Status   Specimen Description PLEURAL RIGHT  Final   Special Requests Immunocompromised  Final   Gram Stain   Final    NO WBC SEEN NO  ORGANISMS SEEN Performed at [facility]   Culture   Final    NO GROWTH 3 DAYS Performed at [facility]   Report Status 09/14/2014 FINAL  Final  Surgical pcr screen     Status: Abnormal   Collection Time: 09/12/14  8:39 AM  Result Value Ref Range Status   MRSA, PCR NEGATIVE NEGATIVE Final   Staphylococcus aureus POSITIVE (A) NEGATIVE Final    Comment:        The Xpert SA Assay (FDA approved for NASAL specimens in patients over 33 years of age), is one component of a comprehensive surveillance program.  Test performance has been validated by [employer] for patients greater than or equal to 62 year old. It is not intended to diagnose infection nor to guide or monitor treatment.  Culture, blood (routine x 2)     Status: None (Preliminary result)   Collection Time: 09/14/14  3:09 PM  Result Value Ref Range Status   Specimen Description BLOOD LEFT ARM  Final   Special Requests BOTTLES DRAWN AEROBIC AND ANAEROBIC  10 CC  Final   Culture  Setup Time   Final    09/14/2014 22:53 Performed at [facility]   Culture   Final           BLOOD CULTURE RECEIVED NO GROWTH TO DATE CULTURE WILL BE HELD FOR 5 DAYS BEFORE ISSUING A FINAL NEGATIVE REPORT Performed at [facility]   Report Status PENDING  Incomplete  Culture, blood (routine x 2)     Status: None (Preliminary result)   Collection Time: 09/14/14  3:15 PM  Result Value Ref Range Status   Specimen Description BLOOD LEFT ARM  Final   Special Requests BOTTLES DRAWN AEROBIC AND ANAEROBIC  10 CC  Final   Culture  Setup Time   Final    09/14/2014 22:55 Performed at [facility]   Culture   Final           BLOOD CULTURE RECEIVED NO GROWTH TO DATE CULTURE WILL BE HELD FOR 5 DAYS BEFORE ISSUING A FINAL NEGATIVE REPORT Performed at [facility]   Report Status PENDING  Incomplete  Clostridium Difficile by PCR     Status: None   Collection Time: 09/18/14  2:42 PM  Result Value Ref Range  Status   C difficile by  pcr NEGATIVE NEGATIVE Final    Comment: Performed at [facility]     Studies: Dg Chest 2 View  09/19/2014   CLINICAL DATA:  Short of breath on weakness  EXAM: CHEST  2 VIEW  COMPARISON:  09/15/2014  FINDINGS: Left pleural effusion and basilar opacity improved. No pneumothorax. Right pleural effusion and basilar opacity increased. Stable Port-A-Cath. Cardiomegaly.  IMPRESSION: Increasing right pleural effusion and associated pulmonary parenchymal opacity.  No pneumothorax after left thoracentesis.   Electronically Signed   By: Maryclare Bean M.D.   On: 09/19/2014 09:19    Scheduled Meds: . sodium chloride   Intravenous Once  . carvedilol  6.25 mg Oral BID WC  . Chlorhexidine Gluconate Cloth  6 each Topical Daily  . dexamethasone  8 mg Oral Q12H  . dronabinol  5 mg Oral BID AC  . enoxaparin (LOVENOX) injection  40 mg Subcutaneous Q24H  . escitalopram  10 mg Oral Daily  . feeding supplement (GLUCERNA SHAKE)  237 mL Oral TID BM  . folic acid  1 mg Oral Daily  . insulin aspart  0-15 Units Subcutaneous TID WC  . insulin detemir  5 Units Subcutaneous QHS  . isosorbide-hydrALAZINE  1 tablet Oral TID  . mupirocin ointment  1 application Nasal BID  . nicotine  21 mg Transdermal Daily  . ondansetron (ZOFRAN) IV  8 mg Intravenous 3 times per day   Or  . ondansetron  4 mg Oral 3 times per day  . pantoprazole  40 mg Oral Daily   Continuous Infusions: . sodium chloride 10 mL/hr at 09/16/14 0215    Principal Problem:   Respiratory failure with hypoxia Active Problems:   Essential hypertension   Pleural effusion, right   Diabetes mellitus type 2, controlled, without complications   Anemia   Acute renal failure   Protein-calorie malnutrition, severe   Acute systolic congestive heart failure   Metastatic lung carcinoma    Time spent: 30 min    Nayelis Bonito, [facility] Pager [phone]. If 7PM-7AM, please contact night-coverage at  [email or web address], password [facility] 09/19/2014, 2:38 PM  LOS: 9 days

## 2014-09-19 NOTE — Progress Notes (Signed)
CSW continuing to follow for disposition needs.   CSW met with pt and pt father at bedside. Pt father reached pt sister, Marcie Bal via telephone. Pt sister inquired about CSW to assist with notarizing advanced directives. CSW discussed that this CSW was not a notary and would have to consult with notary to determine availability and if notary felt comfortable with notarizing document as SW documented yesterday that pt had intermittent confusion.  Pt sister inquired if she could arrange her own notary and CSW discussed with pt sister that she has every right to arrange notary to come to the hospital to notarize document.   CSW continues to assist with SNF placement as well. CSW discussed disposition planning with pt and pt family. CSW discussed that pt does not yet have any bed offers. CSW inquired with pt sister that if facility needs pt family to transport via private vehicle to cancer treatment if pt family would be able to do so. Pt sister reports that pt family would be able to provide transportation. Pt will be placed in SNF as Letter of Guarantee (LOG) as pt does not currently have insurance. Pt sister, Marcie Bal reports that pt family applied for Medicaid for pt yesterday.    CSW followed up with [facility] and facility feels that they cannot meet pt needs given pt need for chemotherapy.  CSW contacted [facility] and sent facility updated clinicals. [facility] reviewing pt information to determine if they can accept pt as a Letter of Guarantee (LOG).  CSW to continue to follow and diligently work on SNF placement as LOG.  Alison Murray, MSW, [place] Work [phone]

## 2014-09-19 NOTE — Progress Notes (Signed)
Inpatient Diabetes Program Recommendations  AACE/ADA: New Consensus Statement on Inpatient Glycemic Control (2013)  Target Ranges:  Prepandial:   less than 140 mg/dL      Peak postprandial:   less than 180 mg/dL (1-2 hours)      Critically ill patients:  140 - 180 mg/dL   Reason for Visit: Hyperglycemia  Results for TREVAUGHN, SCHEAR (MRN [id number]) as of 09/19/2014 09:48  Ref. Range 09/18/2014 07:57 09/18/2014 11:40 09/18/2014 17:24 09/18/2014 22:53 09/19/2014 07:44  Glucose-Capillary Latest Range: 70-99 mg/dL 193 (H) 199 (H) 264 (H) 196 (H) 194 (H)  Results for ARDITH, LEWMAN (MRN [id number]) as of 09/19/2014 09:48  Ref. Range 09/17/2014 06:30 09/18/2014 05:45 09/19/2014 05:56  Glucose Latest Range: 70-99 mg/dL 200 (H) 218 (H) 217 (H)   FBS still elevated > 200 mg/dL.  Consider addition of Levemir 5 units QHS.  Will continue to follow. Thank you. Lorenda Peck, RD, LDN, CDE Inpatient Diabetes Coordinator [phone]

## 2014-09-20 ENCOUNTER — Inpatient Hospital Stay (HOSPITAL_COMMUNITY): Payer: Medicaid Other

## 2014-09-20 LAB — CBC
HEMATOCRIT: 26.5 % — AB (ref 39.0–52.0)
Hemoglobin: 8.8 g/dL — ABNORMAL LOW (ref 13.0–17.0)
MCH: 27.6 pg (ref 26.0–34.0)
MCHC: 33.2 g/dL (ref 30.0–36.0)
MCV: 83.1 fL (ref 78.0–100.0)
PLATELETS: 217 10*3/uL (ref 150–400)
RBC: 3.19 MIL/uL — AB (ref 4.22–5.81)
RDW: 16.3 % — ABNORMAL HIGH (ref 11.5–15.5)
WBC: 9.1 10*3/uL (ref 4.0–10.5)

## 2014-09-20 LAB — BASIC METABOLIC PANEL
ANION GAP: 11 (ref 5–15)
BUN: 36 mg/dL — ABNORMAL HIGH (ref 6–23)
CO2: 24 meq/L (ref 19–32)
Calcium: 7.6 mg/dL — ABNORMAL LOW (ref 8.4–10.5)
Chloride: 103 mEq/L (ref 96–112)
Creatinine, Ser: 1.99 mg/dL — ABNORMAL HIGH (ref 0.50–1.35)
GFR calc Af Amer: 40 mL/min — ABNORMAL LOW (ref >90)
GFR calc non Af Amer: 34 mL/min — ABNORMAL LOW (ref >90)
GLUCOSE: 188 mg/dL — AB (ref 70–99)
POTASSIUM: 4.5 meq/L (ref 3.7–5.3)
SODIUM: 138 meq/L (ref 137–147)

## 2014-09-20 LAB — GLUCOSE, CAPILLARY
Glucose-Capillary: 192 mg/dL — ABNORMAL HIGH (ref 70–99)
Glucose-Capillary: 155 mg/dL — ABNORMAL HIGH (ref 70–99)
Glucose-Capillary: 189 mg/dL — ABNORMAL HIGH (ref 70–99)
Glucose-Capillary: 186 mg/dL — ABNORMAL HIGH (ref 70–99)

## 2014-09-20 LAB — CULTURE, BLOOD (ROUTINE X 2)
CULTURE: NO GROWTH
Culture: NO GROWTH

## 2014-09-20 MED ORDER — LIDOCAINE HCL 1 % IJ SOLN
INTRAMUSCULAR | Status: AC
  Filled 2014-09-20: qty 20

## 2014-09-20 NOTE — Progress Notes (Signed)
Ralph [name] feels good today. He's not complaining of any shortness of breath. The x-ray yesterday did show increase in the right pleural effusion. It would not surprise me that he has increase in the effusion.  I would not expect the chemotherapy to start working for another couple weeks.  He feels his appetite is doing a little bit better. He's had no nausea or vomiting.  Am not sure how much physical therapy he is doing. I think they will try to work with him today.  He's not complaining of any pain.  There is no bleeding. His diarrhea seems to be improved.  On his vital signs, temperature is 98.5. Blood pressure is 159/79. Pulse is 82. His breath sounds are good bilaterally. I don't hear any wheezes. He may have some slight decreased breath sounds in the right lung field. Cardiac exam regular rate and rhythm. Abdomen is soft. Bowel sounds are active. Extremity shows no clubbing, cyanosis or edema.  His hemoglobin is 8.8. We will have to watch this. He may need to be transfused again. I think with a low hemoglobin, this will stress his heart and lungs a little bit more.  His renal function is improving nicely.  Overall, he he actually looks better to me. I saw him initially one week ago and I really think that he's made some improvement.  I very much appreciate the great care that he is getting from everybody involved. I appreciate the wonderful care that the staff on [place] is providing him.  Pete E.  Romans 5:3-5

## 2014-09-20 NOTE — Progress Notes (Signed)
[facility] PROGRESS NOTE  Ralph [name] GDJ:[id number] DOB: 09/18/1951 DOA: 09/10/2014 PCP: No PCP Per Patient  Brief Summary  63 year old male with past medical history of diabetes (managed with PO meds), anxiety, depression who presented to [facility] ED 09/10/2014 with worsening shortness of breath, dry cough, and leg swelling.  CT angio chest neg for pulmonary embolism but showed extensive thoracic and upper abdominal adenopathy questionable nodule versus atelectasis in LEFT lower lobe, bilateral pleural effusions greater on the right.  He underwent thoracentesis and had a transudative effusion. Duplex lower extremity was negative for DVT. ECHO demonstrated EF of 20-25% in patient with no previous history of CAD. Cardiology was consulted and he underwent diuresis. Lasix was held due to worsening creatinine. He had excisional LN biopsy on 10/27. Pathology consistent with adenocarcinoma, probably from lung. Patient seen by Dr. Marin Olp and has received first cycle of chemotherapy. He was on antibiotics to cover Health care associated PNA.   Assessment/Plan  Acute respiratory failure with hypoxia secondary to large right Malignant pleural effusion and on admission  secondary to acute systolic heart failure.  -  Thoracentesis perform by radiology on 10-26 yielding 1.7 L. Repeated thoracentesis 10-30 yielding 1.2 L.  IR asked to perform another thoracentesis. -  pleural fluid culture and cytology: with malignant cell consistent with adenocarcinoma. No bacteria growth.  -  ECHO:  Mildly dilated LVF, severely reduced systolic function, EF of 62-22%, diffuse hypokinesis, and grade 1 DD, left atrium moderately dilated, small pericardial effusion -  Ferritin wnl, no globulin gap to suggest MM or amyloid  -  Started on ACEI, but creatinine increased so this was discontinued. -  Started bidil TID -  Continue beta blocker -  Holding lasix due to worsening renal function.   Malignant right  Pleural  Effusion:  Recurrent effusion on chest x ray. Patient denies significant dyspnea.  Await repeat Thoracentesis.  Acute on CKD stage 3 due to diabetes and HTN plus recent IV contrast.  Significant proteinuria on exam -  Renally dose medications -  Minimize nephrotoxins  -  Urine protein-creatinine  -  lasix on hold.  -  Cr  Trending down.    Metastatic Adenocarcinoma probably from Lung primary.  -Diffuse lymphadenopathy found incidentally on CT of the chest - Patient is reportedly up-to-date on his colon cancer and prostate screening  - CT of the abdomen and pelvis: Diffuse adenopathy, no evidence of mass - LDH 499 H, uric acid 7.2, hepatitis panel neg, HIV NR, beta-2 microglobulin pending, ESR 8 - Appreciate general surgery assistance: Excisional LN biopsy performed on 10/27 -pathology from lymph node biopsy showed adenocarcinoma.  -oncology consulted. Appreciate [name] evaluation. PSA 3.2 and CEA 4.8.  -Port cath placement 10-30 -MRI with multiple brain lesion stroke versus metastasis diseases. Plan to repeat MRI in the future.  -Received Chemo 10-31: Alimta.  -Tolerate chemo well.   Acute Systolic HF; EF of 97-98% Compensated at this time. Holding lasix due to worsening renal function.  Cardiology was consulted for possible ischemic work up given ECG findings (possible previous septal infarct, lateral T-wave inversions). They have signed off for now. Patient will need OP follow up but will depend on his cancer prognosis.   HCAP Chest x ray 10-29 with bilateral infiltrates. Received vancomycin and zosyn for 6 days. Change to Levaquin 11-03. Total day of antibiotics 6.   Generalized weakness with progressive failure to thrive  -  PT/OT  -Need SNF  Likely severe protein calorie malnutrition.  Patient denies that he has had weight loss but his family states he has lost a lot of weight over the last year -  Liberalized diet -  Supplements -  Nutrition  consultation  Hypertension, with elevated blood pressures -  Started on metoprolol  -  Add bidil -  Hold ACEI due to AKI  Nonsustained VT - Keep electrolytes within normal limits - On beta blocker.  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 Patient needs to follow up with vascular outpatient. No need for anticoagulation.  ABI; 0.58, right, left 1.14.   Diabetes mellitus, hemoglobin A1c was previously around 12, currently 7.6, possibly due to weight loss, CBG mildly elevated despite NPO for procedure -  Diabetic diet -  mod sliding-scale insulin with meals - On low dose Levemir  Diarrhea;resolved. C diff negative.   Nausea; PRN Zofran.   Anxiety and depression, continue clonazepam and lexapro  Normocytic anemia, hgb approximately stable, likely due to chronic disease -  Iron studies wnl, b12 623, folate 1160, TSH 3.62  Cigarette nicotine abuse and dependence with withdrawal.  Smokes a carton per week -  Smoking cessation counseling -  Nicotine patch  DVT Proph:  lovenox Code Status: DNR Family Communication: Patient and Son.  Disposition Plan: SNF   Consultants:  IR   General surgery  [name].   Cardiology, Dr. [name]  Procedures:  CXR  CT angio chest  CT ab/p   US thoracentesis on 10/26, 10/30  Port Cath 10/30  Chemo Alimta 10/31  Antibiotics: Completed Course of Vancomycin/Zosyn Levaquin  HPI/Subjective: Patient feels well. Denies pain or shortness of breath.   Objective: Filed Vitals:   09/19/14 0514 09/19/14 1340 09/19/14 2100 09/20/14 0620  BP: 155/80 130/67 133/66 159/79  Pulse: 84 84 93 82  Temp: 97.7 F (36.5 C) 98.5 F (36.9 C) 98.1 F (36.7 C) 98.5 F (36.9 C)  TempSrc: Oral Oral Oral Oral  Resp:  18 16 16   Height:      Weight: 89.7 kg (197 lb 12 oz)     SpO2: 99% 100% 100% 96%    Intake/Output Summary (Last 24 hours) at 09/20/14  0847 Last data filed at 09/20/14 0527  Gross per 24 hour  Intake 1456.4 ml  Output    900 ml  Net  556.4 ml   Filed Weights   09/17/14 2100 09/18/14 0442 09/19/14 0514  Weight: 85.5 kg (188 lb 7.9 oz) 87.8 kg (193 lb 9 oz) 89.7 kg (197 lb 12 oz)    Exam:   General:  Mildly cachectic   Cardiovascular:  RRR, nl S1, S2   Respiratory:  Decreases breath sound, mild crackles.   Abdomen:   soft, NT/ND.  MSK:   Normal tone and bulk,   Data Reviewed: Basic Metabolic Panel:  Recent Labs Lab 09/16/14 0520 09/17/14 0630 09/18/14 0545 09/19/14 0556 09/20/14 0540  NA 142 141 137 138 138  K 4.2 4.8 4.5 4.7 4.5  CL 104 103 101 101 103  CO2 27 21 24 25 24   GLUCOSE 85 200* 218* 217* 188*  BUN 31* 36* 37* 36* 36*  CREATININE 2.51* 2.47* 2.30* 2.10* 1.99*  CALCIUM 8.0* 8.1* 8.0* 7.8* 7.6*   CBC:  Recent Labs Lab 09/16/14 0520 09/17/14 0630 09/18/14 0545 09/19/14 0556 09/20/14 0540  WBC 8.1 10.4 11.2* 9.9 9.1  HGB 8.8* 9.7* 9.6* 9.4* 8.8*  HCT 26.4* 29.2*  28.6* 28.5* 26.5*  MCV 84.9 83.4 82.7 83.1 83.1  PLT 195 276 266 245 217   BNP (last 3 results)  Recent Labs  09/12/14 0620  PROBNP 8587.0*   CBG:  Recent Labs Lab 09/19/14 0744 09/19/14 1130 09/19/14 1703 09/19/14 2208 09/20/14 0743  GLUCAP 194* 217* 199* 202* 192*    Recent Results (from the past 240 hour(s))  Body fluid culture     Status: None   Collection Time: 09/11/14 10:28 AM  Result Value Ref Range Status   Specimen Description PLEURAL RIGHT  Final   Special Requests Immunocompromised  Final   Gram Stain   Final    NO WBC SEEN NO ORGANISMS SEEN Performed at [facility]   Culture   Final    NO GROWTH 3 DAYS Performed at [facility]   Report Status 09/14/2014 FINAL  Final  Surgical pcr screen     Status: Abnormal   Collection Time: 09/12/14  8:39 AM  Result Value Ref Range Status   MRSA, PCR NEGATIVE NEGATIVE Final   Staphylococcus aureus POSITIVE (A) NEGATIVE Final     Comment:        The Xpert SA Assay (FDA approved for NASAL specimens in patients over 48 years of age), is one component of a comprehensive surveillance program.  Test performance has been validated by [employer] for patients greater than or equal to 32 year old. It is not intended to diagnose infection nor to guide or monitor treatment.  Culture, blood (routine x 2)     Status: None (Preliminary result)   Collection Time: 09/14/14  3:09 PM  Result Value Ref Range Status   Specimen Description BLOOD LEFT ARM  Final   Special Requests BOTTLES DRAWN AEROBIC AND ANAEROBIC  10 CC  Final   Culture  Setup Time   Final    09/14/2014 22:53 Performed at [facility]   Culture   Final           BLOOD CULTURE RECEIVED NO GROWTH TO DATE CULTURE WILL BE HELD FOR 5 DAYS BEFORE ISSUING A FINAL NEGATIVE REPORT Performed at [facility]   Report Status PENDING  Incomplete  Culture, blood (routine x 2)     Status: None (Preliminary result)   Collection Time: 09/14/14  3:15 PM  Result Value Ref Range Status   Specimen Description BLOOD LEFT ARM  Final   Special Requests BOTTLES DRAWN AEROBIC AND ANAEROBIC  10 CC  Final   Culture  Setup Time   Final    09/14/2014 22:55 Performed at [facility]   Culture   Final           BLOOD CULTURE RECEIVED NO GROWTH TO DATE CULTURE WILL BE HELD FOR 5 DAYS BEFORE ISSUING A FINAL NEGATIVE REPORT Performed at [facility]   Report Status PENDING  Incomplete  Clostridium Difficile by PCR     Status: None   Collection Time: 09/18/14  2:42 PM  Result Value Ref Range Status   C difficile by pcr NEGATIVE NEGATIVE Final    Comment: Performed at [facility]     Studies: Dg Chest 2 View  09/19/2014   CLINICAL DATA:  Short of breath on weakness  EXAM: CHEST  2 VIEW  COMPARISON:  09/15/2014  FINDINGS: Left pleural effusion and basilar opacity improved. No pneumothorax. Right pleural effusion and basilar opacity  increased. Stable Port-A-Cath. Cardiomegaly.  IMPRESSION: Increasing right pleural effusion and associated pulmonary parenchymal  opacity.  No pneumothorax after left thoracentesis.   Electronically Signed   By: Maryclare Bean M.D.   On: 09/19/2014 09:19    Scheduled Meds: . sodium chloride   Intravenous Once  . carvedilol  6.25 mg Oral BID WC  . Chlorhexidine Gluconate Cloth  6 each Topical Daily  . dexamethasone  8 mg Oral Q12H  . dronabinol  5 mg Oral BID AC  . enoxaparin (LOVENOX) injection  40 mg Subcutaneous Q24H  . escitalopram  10 mg Oral Daily  . feeding supplement (GLUCERNA SHAKE)  237 mL Oral TID BM  . folic acid  1 mg Oral Daily  . insulin aspart  0-15 Units Subcutaneous TID WC  . insulin detemir  5 Units Subcutaneous QHS  . isosorbide-hydrALAZINE  1 tablet Oral TID  . levofloxacin  750 mg Oral Q48H  . mupirocin ointment  1 application Nasal BID  . nicotine  21 mg Transdermal Daily  . ondansetron (ZOFRAN) IV  8 mg Intravenous 3 times per day   Or  . ondansetron  4 mg Oral 3 times per day  . pantoprazole  40 mg Oral Daily   Continuous Infusions: . sodium chloride 10 mL/hr at 09/16/14 0215    Principal Problem:   Respiratory failure with hypoxia Active Problems:   Essential hypertension   Pleural effusion, right   Diabetes mellitus type 2, controlled, without complications   Anemia   Acute renal failure   Protein-calorie malnutrition, severe   Acute systolic congestive heart failure   Metastatic lung carcinoma   Acute renal failure syndrome    Time spent: 30 min    [facility] Pager [phone].    If 7PM-7AM, please contact night-coverage at [email or web address], password [facility] 09/20/2014, 8:47 AM  LOS: 10 days

## 2014-09-20 NOTE — Progress Notes (Signed)
PT Cancellation Note  Patient Details Name: Ralph [name] MRN: [id number] DOB: 06-04-1951   Cancelled Treatment:     Pt out of room in Radiology for a Thoracentesis.    Rica Koyanagi  PTA [facility]  Acute  Rehab Pager      [phone]

## 2014-09-20 NOTE — Procedures (Signed)
Successful US guided right sided thoracentesis with 800 cc of serous pleural fluid aspirated.  No immediate complications.

## 2014-09-20 NOTE — Progress Notes (Addendum)
CSW continuing to follow for disposition planning.  CSW received notification from [facility] and Rehab that facility can offer pt a bed as letter of guarantee as long as pt family can provide transport for pt to chemotherapy treatments.   CSW met with pt, pt sister, pt niece, and pt father at bedside. CSW discussed bed offer from [facility] and Rehab. CSW confirmed that pt family will be able to transport pt to chemotherapy treatments. Per pt family, plan is for chemotherapy every 3 weeks for 4 cycles and pt received first cycle during this admission. Pt and pt family are agreeable to [facility] and rehab.   CSW contacted [facility] and Rehab and confirmed facility can accept when pt medically ready for discharge.  CSW provided pt niece with letter for work per her request.   CSW to continue to follow to facilitate pt discharge needs to [facility] and Rehab as a Letter of Guarantee  when pt medically ready for discharge.  Alison Murray, MSW, [place] [employer] [phone]

## 2014-09-21 ENCOUNTER — Encounter (HOSPITAL_COMMUNITY): Payer: Self-pay

## 2014-09-21 LAB — CBC
HEMATOCRIT: 27.3 % — AB (ref 39.0–52.0)
Hemoglobin: 9.1 g/dL — ABNORMAL LOW (ref 13.0–17.0)
MCH: 28.2 pg (ref 26.0–34.0)
MCHC: 33.3 g/dL (ref 30.0–36.0)
MCV: 84.5 fL (ref 78.0–100.0)
Platelets: 233 10*3/uL (ref 150–400)
RBC: 3.23 MIL/uL — ABNORMAL LOW (ref 4.22–5.81)
RDW: 16.2 % — ABNORMAL HIGH (ref 11.5–15.5)
WBC: 9.9 10*3/uL (ref 4.0–10.5)

## 2014-09-21 LAB — BASIC METABOLIC PANEL
ANION GAP: 9 (ref 5–15)
BUN: 34 mg/dL — ABNORMAL HIGH (ref 6–23)
CO2: 24 meq/L (ref 19–32)
Calcium: 7.6 mg/dL — ABNORMAL LOW (ref 8.4–10.5)
Chloride: 102 mEq/L (ref 96–112)
Creatinine, Ser: 1.76 mg/dL — ABNORMAL HIGH (ref 0.50–1.35)
GFR calc Af Amer: 46 mL/min — ABNORMAL LOW (ref >90)
GFR calc non Af Amer: 40 mL/min — ABNORMAL LOW (ref >90)
Glucose, Bld: 209 mg/dL — ABNORMAL HIGH (ref 70–99)
Potassium: 4.3 mEq/L (ref 3.7–5.3)
SODIUM: 135 meq/L — AB (ref 137–147)

## 2014-09-21 LAB — GLUCOSE, CAPILLARY: GLUCOSE-CAPILLARY: 208 mg/dL — AB (ref 70–99)

## 2014-09-21 MED ORDER — ACETAMINOPHEN 325 MG PO TABS: 650.0000 mg | ORAL_TABLET | Freq: Four times a day (QID) | ORAL | Status: DC | PRN

## 2014-09-21 MED ORDER — CARVEDILOL 6.25 MG PO TABS: 6.2500 mg | ORAL_TABLET | Freq: Two times a day (BID) | ORAL | Status: DC

## 2014-09-21 MED ORDER — GLUCERNA SHAKE PO LIQD: 237.0000 mL | Freq: Three times a day (TID) | ORAL | Status: DC

## 2014-09-21 MED ORDER — HEPARIN SOD (PORK) LOCK FLUSH 100 UNIT/ML IV SOLN
500.0000 [IU] | INTRAVENOUS | Status: DC | PRN
  Filled 2014-09-21: qty 5

## 2014-09-21 MED ORDER — INSULIN ASPART 100 UNIT/ML ~~LOC~~ SOLN: SUBCUTANEOUS | Status: DC

## 2014-09-21 MED ORDER — LEVALBUTEROL HCL 0.63 MG/3ML IN NEBU: 0.6300 mg | INHALATION_SOLUTION | RESPIRATORY_TRACT | Status: DC | PRN

## 2014-09-21 MED ORDER — ISOSORB DINITRATE-HYDRALAZINE 20-37.5 MG PO TABS: 1.0000 | ORAL_TABLET | Freq: Three times a day (TID) | ORAL | Status: DC

## 2014-09-21 MED ORDER — LOPERAMIDE HCL 2 MG PO CAPS: 2.0000 mg | ORAL_CAPSULE | ORAL | Status: DC | PRN

## 2014-09-21 MED ORDER — CLONAZEPAM 0.5 MG PO TABS: 0.5000 mg | ORAL_TABLET | Freq: Three times a day (TID) | ORAL | Status: DC | PRN

## 2014-09-21 MED ORDER — INSULIN DETEMIR 100 UNIT/ML ~~LOC~~ SOLN: 10.0000 [IU] | Freq: Every day | SUBCUTANEOUS | Status: DC

## 2014-09-21 MED ORDER — PANTOPRAZOLE SODIUM 40 MG PO TBEC: 40.0000 mg | DELAYED_RELEASE_TABLET | Freq: Every day | ORAL | Status: DC

## 2014-09-21 MED ORDER — DEXAMETHASONE 4 MG PO TABS: 4.0000 mg | ORAL_TABLET | Freq: Two times a day (BID) | ORAL | Status: DC

## 2014-09-21 MED ORDER — NICOTINE 21 MG/24HR TD PT24: 21.0000 mg | MEDICATED_PATCH | Freq: Every day | TRANSDERMAL | Status: DC

## 2014-09-21 MED ORDER — DRONABINOL 5 MG PO CAPS: 5.0000 mg | ORAL_CAPSULE | Freq: Two times a day (BID) | ORAL | Status: DC

## 2014-09-21 MED ORDER — FUROSEMIDE 20 MG PO TABS: 20.0000 mg | ORAL_TABLET | Freq: Every day | ORAL | Status: DC

## 2014-09-21 MED ORDER — DEXAMETHASONE 4 MG PO TABS
4.0000 mg | ORAL_TABLET | Freq: Two times a day (BID) | ORAL | Status: DC
  Administered 2014-09-21: 4 mg via ORAL
  Filled 2014-09-21 (×2): qty 1

## 2014-09-21 MED ORDER — FOLIC ACID 1 MG PO TABS
1.0000 mg | ORAL_TABLET | Freq: Every day | ORAL | Status: DC
Start: 2014-09-21 — End: 2014-11-23

## 2014-09-21 NOTE — Plan of Care (Signed)
Problem: Phase III Progression Outcomes Goal: IV/normal saline lock discontinued Outcome: Completed/Met Date Met:  09/21/14

## 2014-09-21 NOTE — Plan of Care (Signed)
Problem: Discharge Progression Outcomes Goal: Pain controlled with appropriate interventions Outcome: Completed/Met Date Met:  09/21/14

## 2014-09-21 NOTE — Progress Notes (Signed)
Occupational Therapy Treatment Patient Details Name: Ralph [name] MRN: [id number] DOB: [birth year] Today's Date: 09/21/2014    History of present illness 63 yo came to ED with reports of increased weakness, BLE swelling, not feeling well, and SOB. Tests show B pleural effusion (R>L) with respiratory failure, DM2, Acute renal failure, and biopsy on 09/12/2014 showed lymph node is completely replaced by metastatic adenocarcinoma per oncology hospital note.    OT comments  Pt tolerated session well with extra time/rest breaks.  Not steady for SPT with bedrail and BSC rail:  Use RW  Follow Up Recommendations  SNF    Equipment Recommendations  3 in 1 bedside comode    Recommendations for Other Services      Precautions / Restrictions Precautions Precautions: Fall       Mobility Bed Mobility         Supine to sit: Supervision Sit to supine: Min assist   General bed mobility comments: assist for legs back to bed. Extra time for activities.  Pt raised HOB when getting OOB  Transfers   Equipment used: Rolling walker (2 wheeled) Transfers: Sit to/from Stand Sit to Stand: Min guard         General transfer comment: close guard for safety    Balance           Standing balance support: Bilateral upper extremity supported Standing balance-Leahy Scale: Poor Standing balance comment: loss of balance using bedside commode rail and bed rail                   ADL       Grooming: Oral care;Set up;Sitting                   Toilet Transfer: Minimal assistance;BSC;Stand-pivot   Toileting- Clothing Manipulation and Hygiene: Total assistance;Sitting/lateral lean         General ADL Comments: Noted pt fatiques easily.  He takes extra time for activities and rest breaks encouraged.  Pt c/o soreness in rectal area:  therapist assisted with hygiene and applied cream.  Pt performed stand pivot transfer, holding BSC and bedrail, but he did have anterior loss of  balance on return back to bed, requiring assistance to maintain balance.  Educated on energy conservation and performing small bursts of activity to increase strength/endurance.      Vision                     Perception     Praxis      Cognition   Behavior During Therapy: WFL for tasks assessed/performed Overall Cognitive Status: Within Functional Limits for tasks assessed                       Extremity/Trunk Assessment               Exercises     Shoulder Instructions       General Comments      Pertinent Vitals/ Pain       Pain Assessment: Faces, 8 with hygiene, rectum.  Sore at rest and he is sidelying for comfort  Home Living                                          Prior Functioning/Environment              Frequency Min 2X/week  Progress Toward Goals  OT Goals(current goals can now be found in the care plan section)  Progress towards OT goals: Progressing toward goals  Acute Rehab OT Goals Time For Goal Achievement: 10/05/14 Potential to Achieve Goals: Good ADL Goals Pt Will Perform Grooming: with min guard assist;sitting;standing (sit to stand for teeth, at sink) Pt Will Perform Lower Body Bathing: with min assist;with adaptive equipment;sit to/from stand  Plan      Co-evaluation                 End of Session     Activity Tolerance Patient tolerated treatment well (with rest breaks)   Patient Left in bed;with call bell/phone within reach (bed alarm was off)   Nurse Communication          Time: 7282-0601 OT Time Calculation (min): 43 min  Charges: OT General Charges $OT Visit: 1 Procedure OT Treatments $Self Care/Home Management : 38-52 mins  Rodolfo Notaro 09/21/2014, 10:19 AM Lesle Chris, OTR/L [phone] 09/21/2014

## 2014-09-21 NOTE — Plan of Care (Signed)
Problem: Phase III Progression Outcomes Goal: Activity at appropriate level-compared to baseline (UP IN CHAIR FOR HEMODIALYSIS)  Outcome: Completed/Met Date Met:  09/21/14

## 2014-09-21 NOTE — Plan of Care (Signed)
Problem: Phase II Progression Outcomes Goal: Discharge plan established Outcome: Completed/Met Date Met:  09/21/14

## 2014-09-21 NOTE — Plan of Care (Signed)
Problem: Discharge Progression Outcomes Goal: Complications resolved/controlled Outcome: Completed/Met Date Met:  09/21/14

## 2014-09-21 NOTE — Plan of Care (Signed)
Problem: Discharge Progression Outcomes Goal: Hemodynamically stable Outcome: Completed/Met Date Met:  09/21/14     

## 2014-09-21 NOTE — Progress Notes (Signed)
Mr. Hasten is doing okay. He's complaining of his scrotum sticking to the bed. It looks like he has some lotion or cream in the scrotal area.  He had 800 mL of fluid taken out from the right lung. His breathing is doing okay. I think that the chemotherapy probably we will not start helping this for another couple weeks.  He hopefully will do some physical therapy today.  His appetite is okay. There is no nausea or vomiting. He's had no positive diarrhea.  His vital signs are stable. Blood pressure 156/90. Temperature 98.1. Pulse is 84.his lungs show good breath sounds bilaterally. Cardiac exam regular in rhythm with no murmurs, rubs or bruits. Abdomen is soft. Bowel sounds are active. Extremities shows no clubbing, cyanosis or edema.  His labs look pretty stable. White cell count 9.9. Hemoglobin 9.1. Platelet count 233. BUN 34 and creatinine is 1.76. This continues to improve.  Nutrition and strength are the keys for Mr. Flamenco.  Whenever he is ready for discharge, I think he can be discharged. My only concern would be the reoccurrence of the right pleural fluid. Hopefully, this will start to improve once the chemotherapy starts working. I will I do think that this might happen next week.  I'm glad to see that his kidney function is getting better. Per his blood sugars are on the high side. These have to be monitored. Maybe I can cut back his steroid dose.  Cook 55:22

## 2014-09-21 NOTE — Plan of Care (Signed)
Problem: Discharge Progression Outcomes Goal: Discharge plan in place and appropriate Outcome: Completed/Met Date Met:  09/21/14     

## 2014-09-21 NOTE — Plan of Care (Signed)
Problem: Phase III Progression Outcomes Goal: Discharge plan remains appropriate-arrangements made Outcome: Completed/Met Date Met:  09/21/14     

## 2014-09-21 NOTE — Progress Notes (Signed)
Pt for discharge to [facility].  CSW facilitated pt discharge needs including contacting facility, faxing pt discharge information via [facility], discussing with pt at bedside, pt family updated at bedside as well, providing RN phone number to call report, discussing with Clinical Social Work Multimedia programmer, Nathaniel Man to have Letter of Guarantee provided to [employer], and arranging ambulance transport for pt to [place] scheduled for 2:30 pm.  Pt coping appropriately with transition to [facility] and Rehab. Pt has strong support from family.   No further social work needs identified at this time.  CSW signing off.   Alison Murray, MSW, [place] [facility] [phone]

## 2014-09-21 NOTE — Discharge Instructions (Signed)
Lung Cancer Lung cancer is an abnormal growth of cells in one or both of your lungs. These extra cells may form a mass of tissue called a growth or tumor. Tumors can be either cancerous (malignant) or not cancerous (benign).  Lung cancer is the most common cause of cancer death in men and women. There are several different types of lung cancers. Usually, lung cancer is described as either small cell lung cancer or nonsmall cell lung cancer. Other types of cancer occur in the lungs, including carcinoid and cancers spread from other organs. The types of cancer have different behavior and treatment. RISK FACTORS Smoking is the most common risk factor for developing lung cancer. Other risk factors include:  Radon gas exposure.  Asbestos and other industrial substance exposure.  Second hand tobacco smoke.  Air pollution.  Family or personal history of lung cancer.  Age older than 20 years. CAUSES  Lung cancer usually starts when the lungs are exposed to harmful chemicals. Smoking is the most common risk factor for lung cancer. When you quit smoking, your risk of lung cancer falls each year (but is never the same as a person who has never smoked).  SYMPTOMS  Lung cancer may not have any symptoms in its early stages. The symptoms can depend on the type of cancer, its location, and other factors. Symptoms can include:  Cough (either new, different, or more severe).  Shortness of breath.  Coughing up blood (hemoptysis).  Chest pain.  Hoarseness.  Swelling of the face.  Drooping eyelid.  Changes in blood tests, such as low sodium (hyponatremia), high calcium (hypercalcemia), or low blood count (anemia).  Weight loss. DIAGNOSIS  Your health care provider may suspect lung cancer based on your symptoms or based on tests obtained for other reasons. Tests or procedures used to find or confirm the presence of lung cancer may include:  Chest X-ray.  CT scan of the lungs and chest.  Blood  tests.  Taking a tissue sample (biopsy) from your lung to look for cancer cells. Your cancer will be staged to determine its severity and extent. Staging is a careful attempt to find out the size of the tumor, whether the cancer has spread, and if so, to what parts of the body. You may need to have more tests to determine the stage of your cancer. The test results will help determine what treatment plan is best for you.   Stage 0--This is the earliest stage of lung cancer. In this stage the tumor is present in only a few layers of cells and has not grown beyond the inner lining of the lungs. Stage 0 (carcinoma in situ) is considered noninvasive, meaning at this stage it is not yet capable of spreading to other regions.  Stage I-- The cancer is located only in the lungs and not spread to any lymph nodes.  Stage II--The cancer is in the lungs and the nearby lymph nodes.  Stage III--The cancer is in the lungs and the lymph nodes in the middle of the chest. This is also called locally advanced disease. This stage has two subtypes:  Stage IIIa - The cancer has spread only to lymph nodes on the same side of the chest where the cancer started.  Stage IIIb - The cancer has spread to lymph nodes on the opposite side of the chest or above the collar bone.  Stage IV-- This is the most advanced stage of lung cancer and is also called advanced disease. This  stage describes when the cancer has spread to both lungs, the fluid in the area around the lungs, or to another body part. Your health care provider may tell you the detailed stage of your cancer, which includes both a number and a letter.  TREATMENT  Depending on the type and stage, lung cancer may be treated with surgery, radiation therapy, chemotherapy, or targeted therapy. Some people have a combination of these therapies. Your treatment plan will be developed by your health care team.  [place] not smoke.  Only take  over-the-counter or prescription medicines for pain, discomfort, or fever as directed by your health care provider.  Maintain a healthy diet.  Consider joining a support group. This may help you learn to cope with the stress of having lung cancer.  Seek advice to help you manage treatment side effects.  Keep all follow-up appointments as directed by your health care provider.  Inform your cancer specialist if you are admitted to the hospital. [place] IF:   You are losing weight without trying.  You have a persistent cough.  You feel short of breath.  You tire easily. SEEK IMMEDIATE MEDICAL CARE IF:   You cough up clotted blood or bright red blood.  Your pain is not manageable or controlled by medicine.  You develop new difficulty breathing or chest pain.  You develop swelling in one or both ankles or legs, or swelling in your face or neck.  You develop headache or confusion. Document Released: 02/09/2001 Document Revised: 08/24/2013 Document Reviewed: 03/09/2014 [facility] Patient Information 2015 [place], [place]. This information is not intended to replace advice given to you by your health care provider. Make sure you discuss any questions you have with your health care provider.

## 2014-09-21 NOTE — Discharge Summary (Signed)
[facility]  Physician Discharge Summary   Patient ID: Ralph [name] MRN: [id number] DOB/AGE: 03-29-1951 63 y.o.  Admit date: 09/10/2014 Discharge date: 09/21/2014  PCP: No PCP Per Patient  DISCHARGE DIAGNOSES:  Principal Problem:   Respiratory failure with hypoxia Active Problems:   Essential hypertension   Pleural effusion, right   Diabetes mellitus type 2, controlled, without complications   Anemia   Acute renal failure   Protein-calorie malnutrition, severe   Acute systolic congestive heart failure   Metastatic lung carcinoma   Acute renal failure syndrome   RECOMMENDATIONS FOR OUTPATIENT FOLLOW UP: 1. Next Chemotherapy will be on 10/09/14 per Dr. Marin Olp 2. CBC and Bmet next week to check renal function and anemia 3. Check CBG's three time daily.   DISCHARGE CONDITION: fair  Diet recommendation: Regular as tolerated  Filed Weights   09/18/14 0442 09/19/14 0514 09/21/14 0535  Weight: 87.8 kg (193 lb 9 oz) 89.7 kg (197 lb 12 oz) 82.7 kg (182 lb 5.1 oz)    INITIAL HISTORY: 63 year old male with past medical history of diabetes (managed with PO meds), anxiety, depression who presented to [facility] ED 09/10/2014 with worsening shortness of breath, dry cough, and leg swelling. CT angio chest neg for pulmonary embolism but showed extensive thoracic and upper abdominal adenopathy questionable nodule versus atelectasis in LEFT lower lobe, bilateral pleural effusions greater on the right. He underwent thoracentesis and had a transudative effusion. Duplex lower extremity was negative for DVT. ECHO demonstrated EF of 20-25% in patient with no previous history of CAD. Cardiology was consulted and he underwent diuresis. Lasix was held due to worsening creatinine. He had excisional LN biopsy on 10/27. Pathology consistent with adenocarcinoma, probably from lung. Patient seen by Dr. Marin Olp and has received first cycle of chemotherapy. He was on antibiotics to cover Health care  associated PNA.   Consultants:  IR   General surgery  [name].   Cardiology  Procedures:  CXR  CT angio chest  CT ab/p   US thoracentesis on 10/26, 10/30  Port Cath 10/30  Chemo Alimta 10/31  Antibiotics: Completed Course of Vancomycin/Zosyn and [place]:   Acute respiratory failure with hypoxia  This was secondary to large right Malignant pleural effusion and on admission secondary to acute systolic heart failure. Thoracentesis was performed by radiology on 10-26 yielding 1.7 L. Repeated thoracentesis 10-30 yielding 1.2 L and again on 11/4 yielding 857m. Pleural fluid culture and cytology showed: with malignant cell consistent with adenocarcinoma. No bacteria growth. ECHO: Mildly dilated LVF, severely reduced systolic function, EF of 244-31% diffuse hypokinesis, and grade 1 DD, left atrium moderately dilated, small pericardial effusion.   Acute Systolic HF EF of 254-00% Compensated at this time. He was diuresed which resulted in worsening renal function. Lasix was held and his creatinine improved. Will discharge on low dose Lasix with close monitoring of renal function. Cardiology was consulted for possible ischemic work up given ECG findings (possible previous septal infarct, lateral T-wave inversions). They have signed off for now. Patient will need OP follow up but will depend on his cancer prognosis. Started on ACEI, but creatinine increased so this was discontinued. Started bidil TID. Continue beta blocker.  Malignant right Pleural Effusion  Please see above under Acute respiratory failure.   Metastatic Adenocarcinoma probably from Lung primary.  Diffuse lymphadenopathy found incidentally on CT of the chest. CT of the abdomen and pelvis: Diffuse adenopathy, no evidence of mass. LDH 499 H, uric acid 7.2,  hepatitis panel neg, HIV NR, beta-2 microglobulin pending, ESR 8. Appreciate general surgery assistance with Excisional LN biopsy  performed on 10/27. Pathology from lymph node biopsy showed adenocarcinoma. Oncology was consulted. Appreciate Dr Antonieta Pert evaluation. PSA 3.2 and CEA 4.8. Port cath placement 10-30. MRI Brain with multiple brain lesion stroke versus metastasis diseases. Plan to repeat MRI in the future. Patient received Chemo 10-31: Alimta. He tolerated chemo well. Next chemo will be on 11/23.   Acute on CKD stage 3 due to diabetes and HTN plus recent IV contrast. Significant proteinuria on exam. Lasix likely also contributed and was held. Creatinine has improved.   HCAP Chest x ray 10-29 with bilateral infiltrates. Received vancomycin and zosyn for 6 days. Change to Levaquin 11-03 and has completed course of same.   Generalized weakness with progressive failure to thrive  PT/OT was seen and is thought to need SNF for rehab.  Likely severe protein calorie malnutrition Patient denies that he has had weight loss but his family states he has lost a lot of weight over the last year. Supplements and appetite stimulants.   Hypertension Stable on current regimen.   Nonsustained VT Keep electrolytes within normal limits. On beta blocker.  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 Patient may need to follow up with vascular as outpatient depending on what happens with his cancer. No need for anticoagulation. ABI; 0.58, right, left 1.14.   Diabetes mellitus Type 2 Hemoglobin A1c was previously around 12, currently 7.6, possibly due to weight loss. CBG elevated due to steroids. Will increase Levemir dose. Hold all oral agents. SSI as well.   Anxiety and depression Continue clonazepam and lexapro  Normocytic anemia Hgb stable, likely due to chronic disease. Iron studies wnl, b12 623, folate 1160, TSH 3.62  Cigarette nicotine abuse and dependence with withdrawal Smokes a carton per week. Smoking cessation counseling. Nicotine  patch  He remains stable overall. [place] for discharge to SNF.   PERTINENT LABS:  The results of significant diagnostics from this hospitalization (including imaging, microbiology, ancillary and laboratory) are listed below for reference.    Microbiology: Recent Results (from the past 240 hour(s))  Body fluid culture     Status: None   Collection Time: 09/11/14 10:28 AM  Result Value Ref Range Status   Specimen Description PLEURAL RIGHT  Final   Special Requests Immunocompromised  Final   Gram Stain   Final    NO WBC SEEN NO ORGANISMS SEEN Performed at [facility]   Culture   Final    NO GROWTH 3 DAYS Performed at [facility]   Report Status 09/14/2014 FINAL  Final  Surgical pcr screen     Status: Abnormal   Collection Time: 09/12/14  8:39 AM  Result Value Ref Range Status   MRSA, PCR NEGATIVE NEGATIVE Final   Staphylococcus aureus POSITIVE (A) NEGATIVE Final    Comment:        The Xpert SA Assay (FDA approved for NASAL specimens in patients over 62 years of age), is one component of a comprehensive surveillance program.  Test performance has been validated by [employer] for patients greater than or equal to 68 year old. It is not intended to diagnose infection nor to guide or monitor treatment.  Culture, blood (routine x 2)     Status: None   Collection Time: 09/14/14  3:09 PM  Result Value Ref Range Status  Specimen Description BLOOD LEFT ARM  Final   Special Requests BOTTLES DRAWN AEROBIC AND ANAEROBIC  10 CC  Final   Culture  Setup Time   Final    09/14/2014 22:53 Performed at [facility]    Culture   Final    NO GROWTH 5 DAYS Performed at [facility]    Report Status 09/20/2014 FINAL  Final  Culture, blood (routine x 2)     Status: None   Collection Time: 09/14/14  3:15 PM  Result Value Ref Range Status   Specimen Description BLOOD LEFT ARM  Final   Special Requests BOTTLES DRAWN AEROBIC AND ANAEROBIC  10 CC  Final    Culture  Setup Time   Final    09/14/2014 22:55 Performed at [place]   Final    NO GROWTH 5 DAYS Performed at [facility]    Report Status 09/20/2014 FINAL  Final  Clostridium Difficile by PCR     Status: None   Collection Time: 09/18/14  2:42 PM  Result Value Ref Range Status   C difficile by pcr NEGATIVE NEGATIVE Final    Comment: Performed at [facility]: Basic Metabolic Panel:  Recent Labs Lab 09/17/14 0630 09/18/14 0545 09/19/14 0556 09/20/14 0540 09/21/14 0418  NA 141 137 138 138 135*  K 4.8 4.5 4.7 4.5 4.3  CL 103 101 101 103 102  CO2 21 24 25 24 24   GLUCOSE 200* 218* 217* 188* 209*  BUN 36* 37* 36* 36* 34*  CREATININE 2.47* 2.30* 2.10* 1.99* 1.76*  CALCIUM 8.1* 8.0* 7.8* 7.6* 7.6*   CBC:  Recent Labs Lab 09/17/14 0630 09/18/14 0545 09/19/14 0556 09/20/14 0540 09/21/14 0418  WBC 10.4 11.2* 9.9 9.1 9.9  HGB 9.7* 9.6* 9.4* 8.8* 9.1*  HCT 29.2* 28.6* 28.5* 26.5* 27.3*  MCV 83.4 82.7 83.1 83.1 84.5  PLT 276 266 245 217 233   BNP: BNP (last 3 results)  Recent Labs  09/12/14 0620  PROBNP 8587.0*   CBG:  Recent Labs Lab 09/20/14 0743 09/20/14 1155 09/20/14 1658 09/20/14 2046 09/21/14 0719  GLUCAP 192* 155* 189* 186* 208*     IMAGING STUDIES Dg Chest 1 View  09/20/2014   CLINICAL DATA:  Post right-sided thoracentesis.  EXAM: CHEST - 1 VIEW  COMPARISON:  Chest radiograph- 09/19/2014; 09/15/2014; ultrasound-guided right-sided thoracentesis - 09/15/2014  FINDINGS: Grossly unchanged enlarged cardiac silhouette and mediastinal contours. Stable position of support apparatus. Interval reduction in persistent small right-sided effusion post right-sided thoracentesis. No pneumothorax. Pulmonary vasculature remains indistinct with cephalization of flow. No change to minimal increase in small left-sided pleural effusion. Minimally improved aeration of lungs with persistent bibasilar opacities. No new focal  airspace opacities. Unchanged bones.  IMPRESSION: 1. Interval reduction in persistent small right-sided effusion post thoracentesis. No pneumothorax. 2. Grossly unchanged findings of pulmonary edema, small left-sided effusion and associated bibasilar opacities, atelectasis versus infiltrate.   Electronically Signed   By: Sandi Mariscal M.D.   On: 09/20/2014 15:52   Dg Chest 1 View  09/15/2014   CLINICAL DATA:  Subsequent encounter for left pleural effusion.  EXAM: CHEST - 1 VIEW  COMPARISON:  09/14/2014.  FINDINGS: 1211 hrs. Moderate left pleural effusion has progressed in the interval. No substantial right pleural effusion. No evidence for pneumothorax. Right Port-A-Cath is new in the interval with tip overlying the distal SVC. Cardiopericardial silhouette is upper limits of normal for size. Mild leftward  deviation of the trachea stable.  IMPRESSION: No evidence of pneumothorax status post thoracentesis.   Electronically Signed   By: Misty Stanley M.D.   On: 09/15/2014 13:27   Dg Chest 1 View  09/11/2014   CLINICAL DATA:  Post right thoracentesis  EXAM: CHEST - 1 VIEW  COMPARISON:  09/10/2014  FINDINGS: Decreased right pleural effusion following thoracentesis. Small right effusion remains. No pneumothorax. Right lower lobe atelectasis has improved  Small left effusion and left lower lobe atelectasis unchanged. Negative for heart failure. Right peritracheal adenopathy noted  IMPRESSION: Decreased right pleural effusion following thoracentesis. No pneumothorax.   Electronically Signed   By: Franchot Gallo M.D.   On: 09/11/2014 11:01   Dg Chest 2 View  09/19/2014   CLINICAL DATA:  Short of breath on weakness  EXAM: CHEST  2 VIEW  COMPARISON:  09/15/2014  FINDINGS: Left pleural effusion and basilar opacity improved. No pneumothorax. Right pleural effusion and basilar opacity increased. Stable Port-A-Cath. Cardiomegaly.  IMPRESSION: Increasing right pleural effusion and associated pulmonary parenchymal opacity.   No pneumothorax after left thoracentesis.   Electronically Signed   By: Maryclare Bean M.D.   On: 09/19/2014 09:19   Dg Chest 2 View  09/14/2014   CLINICAL DATA:  Progressive shortness of Breath  EXAM: CHEST  2 VIEW  COMPARISON:  One-view chest 09/11/2014  FINDINGS: Heart is enlarged. Bilateral pleural effusions and and lower lobe airspace disease has increased bilaterally. Increased pulmonary vascular congestion is noted as well. The upper lung fields are clear. The visualized soft tissues and the bony thorax are unremarkable.  IMPRESSION: 1. Progressive bilateral lower lobe airspace disease and effusions compatible with progressive disease and probable pneumonia. 2. Prominence of the hila bilaterally may in part represent pulmonary vascular congestion. Known adenopathy is also present.   Electronically Signed   By: Lawrence Santiago M.D.   On: 09/14/2014 11:49   Dg Chest 2 View (if Patient Has Fever And/or Copd)  09/10/2014   CLINICAL DATA:  Short of breath and burping over the last 2 days. History of hypertension and diabetes. History of smoking.  EXAM: CHEST  2 VIEW  COMPARISON:  03/14/2014.  FINDINGS: There are new moderate right and small left pleural effusions. There is additional lung base opacity likely atelectasis. Infiltrate not excluded. No evidence of pulmonary edema. No pneumothorax.  Cardiac silhouette is normal in size. No mediastinal or hilar masses.  Bony thorax is intact.  IMPRESSION: Moderate right and small left effusions, new from the prior chest radiograph. Associated right greater than left lung base opacity is likely atelectasis. Consider pneumonia in the proper clinical setting. No pulmonary edema.   Electronically Signed   By: Lajean Manes M.D.   On: 09/10/2014 11:52   Ct Angio Chest Pe W/cm &/or Wo Cm  09/10/2014   CLINICAL DATA:  Shortness of breath, increased burping for 2 days, had a cold that feels like it is getting worse, history hypertension, diabetes mellitus, anxiety  EXAM:  CT ANGIOGRAPHY CHEST WITH CONTRAST  TECHNIQUE: Multidetector CT imaging of the chest was performed using the standard protocol during bolus administration of intravenous contrast. Multiplanar CT image reconstructions and MIPs were obtained to evaluate the vascular anatomy.  CONTRAST:  160m OMNIPAQUE IOHEXOL 350 MG/ML SOLN IV  COMPARISON:  None  FINDINGS: Scattered atherosclerotic calcifications aorta and coronary arteries.  Aorta normal caliber.  Asymmetric gynecomastia slightly greater on LEFT.  Pulmonary arteries patent.  No evidence of pulmonary embolism.  BILATERAL pleural effusions moderate  LEFT and large RIGHT.  Minimal pericardial effusion.  Extensive adenopathy including:  RIGHT peritracheal node 26 mm short axis image 38  Precarinal nodes 20 mm and 15 mm image 45.  Subcarinal node 26 mm image 54.  LEFT hilar 14 mm image 56.  RIGHT supraclavicular 21 mm image 6  Retrocrural 21 mm image 90  Gastrohepatic ligament 37 mm image 103  Additional scattered upper abdominal, mediastinal and inferior cervical lymph nodes bilaterally.  Questionable focal nodule in LEFT lower lobe 19 mm image 70.  Significant compressive atelectasis of RIGHT greater than LEFT lungs.  Emphysematous changes at apices.  Narrowing of LEFT upper and LEFT lower lobe bronchi by hilar adenopathy.  No additional mass, nodule or infiltrate.  Minimal displacement of trachea to the LEFT by paratracheal adenopathy.  Slightly heterogeneous density of the thoracic vertebrae without focal lesion.  Review of the MIP images confirms the above findings.  IMPRESSION: No evidence of pulmonary embolism.  Extensive thoracic and upper abdominal adenopathy.  Questionable nodule versus atelectasis in LEFT lower lobe.  BILATERAL pleural effusions RIGHT greater than LEFT with associated atelectasis.  Findings may represent metastatic disease or lymphoma.  Findings called to Dr. Mathis Bud on 09/10/2014 at 1345 hr.   Electronically Signed   By: Lavonia Dana M.D.   On:  09/10/2014 13:45   Mr Brain Wo Contrast  09/14/2014   CLINICAL DATA:  Metastatic lung cancer.  EXAM: MRI HEAD WITHOUT CONTRAST  TECHNIQUE: Multiplanar, multiecho pulse sequences of the brain and surrounding structures were obtained without intravenous contrast.  COMPARISON:  None.  FINDINGS: Images are mildly degraded by motion artifact. There is a 6 mm focus of restricted diffusion in the right aspect of the pons. Two punctate foci of increased diffusion weighted signal are present in the left occipital lobe with 1 present in the right occipital lobe and 1 in the left parietal lobe. There is no evidence of intracranial hemorrhage, midline shift, or extra-axial fluid collection. There is mild generalized cerebral atrophy. Remote lacunar infarcts are seen in the right corona radiata and at the posterior aspect of the posterior limb of the right internal capsule. Foci of T2 hyperintensity throughout the subcortical and deep cerebral white matter and pons are nonspecific but compatible with mild to moderate chronic small vessel ischemic disease.  Orbits are unremarkable. Mild right ethmoid air cell mucosal thickening is noted. There is rightward nasal septal deviation and suggestion of prior left maxillary antrostomy. No significant mastoid fluid is seen. Major intracranial vascular flow voids are preserved.  IMPRESSION: 1. Subcentimeter foci of abnormal diffusion weighted signal in the pons, occipital lobes, and left parietal lobe. These may represent small, acute to subacute embolic infarcts, however small metastases are also possible. Follow-up postcontrast brain MRI is recommended when the patient's renal function improves. 2. Mild-to-moderate chronic small vessel ischemic disease and remote lacunar infarcts as above.   Electronically Signed   By: Logan Bores   On: 09/14/2014 08:45   Nm Bone Scan Whole Body  09/14/2014   CLINICAL DATA:  Metastatic lung cancer, history colon cancer, hypertension, diabetes   EXAM: NUCLEAR MEDICINE WHOLE BODY BONE SCAN  TECHNIQUE: Whole body anterior and posterior images were obtained approximately 3 hours after intravenous injection of radiopharmaceutical.  RADIOPHARMACEUTICALS:  22.0 mCi Technetium-46mMDP IV  COMPARISON:  None  Radiographic correlation: CT chest 09/10/2014, CT abdomen and pelvis 09/11/2014, MR brain 09/14/2014  FINDINGS: Poor osseous tracer accumulation question related to decrease bone turnover, question decreased activity.  Increased  soft tissue in urinary tract distribution of tracer.  Uptake at the RIGHT lateral aspect of the lumbar spine at approximately L3, nonspecific ; patient has a single sclerotic focus 7 mm diameter at this approximate position on CT.  Minimal uptake at the shoulders, sternoclavicular joints, elbows, knees, and feet, distribution typically degenerative.  No additional worrisome sites of abnormal tracer accumulation identified to suggest osseous metastatic disease.  IMPRESSION: Single mild focus of increased tracer localization at the RIGHT lateral aspect of the approximately L3 vertebral body, roughly corresponding in position with a sclerotic focus on recent CT; sclerotic metastatic lesion not excluded and evaluation of the lumbar spine by MR imaging recommended.  Scattered degenerative type uptake.  Poor osseous tracer accumulation suggesting decreased activity/poor bone turnover.   Electronically Signed   By: Lavonia Dana M.D.   On: 09/14/2014 14:20   Ct Abdomen Pelvis W Contrast  09/11/2014   CLINICAL DATA:  Weakness, pleural effusions in lymphadenopathy  EXAM: CT ABDOMEN AND PELVIS WITH CONTRAST  TECHNIQUE: Multidetector CT imaging of the abdomen and pelvis was performed using the standard protocol following bolus administration of intravenous contrast.  CONTRAST:  130m OMNIPAQUE IOHEXOL 300 MG/ML  SOLN  COMPARISON:  09/10/2014  FINDINGS: Lung bases again demonstrate lower lobe consolidation with bilateral pleural effusions right  greater than left. The overall appearance is similar to that seen on the prior CT examination. Considerable subcarinal and hilar adenopathy is identified. A mild pericardial effusion is noted.  The liver shows no focal mass lesion. Some mild periportal edema is noted. The gallbladder show some surrounding pericholecystic fluid likely related to mild ascites. No definitive cholelithiasis is seen. The spleen is within normal limits. The adrenal glands appear within normal limits as does the pancreas.  The kidneys show a normal enhancement pattern bilaterally. No renal calculi or obstructive changes are seen. Delayed images demonstrate normal excretion of contrast material.  Multifocal lymphadenopathy is identified throughout the abdomen. Retrocrural nodes are again noted on the right similar to that seen on recent CT. Surrounding the celiac axis in the gastrohepatic ligament, there is a large conglomeration of lymph nodes. The largest component of this is to the left of the celiac axis measuring 3.7 x 4.1 cm best seen on image number 30 of series 2. This extends into the porta hepatis. Significant periaortic, pericaval and intra-aortic caval adenopathy is noted. The largest of these nodes lies in the left periaortic space measuring 2.4 cm in short axis. Portacaval adenopathy is noted as well. This measures 2.5 cm in short axis. Mild inguinal lymphadenopathy is noted. The iliac chains appear clear aortoiliac calcifications are noted. The bony structures show degenerative change of the lumbar spine. No definitive osteolytic or osteoblastic lesions are seen.  The appendix is not well visualized although no inflammatory changes are noted. Scattered diverticular change of the colon is seen.  IMPRESSION: Bilateral pleural effusions with bilateral lower lobe consolidation.  Diffuse lymphadenopathy throughout the abdomen as described. Again these changes may represent metastatic disease or diffuse lymphoma. Tissue sampling  may be helpful.  Mild ascites.   Electronically Signed   By: MInez CatalinaM.D.   On: 09/11/2014 10:18   Ir Fluoro Guide Cv Line Right  09/15/2014   CLINICAL DATA:  Metastatic adenocarcinoma presumably of lung primary. The patient requires a porta cath to begin chemotherapy.  EXAM: IMPLANTED PORT A CATH PLACEMENT WITH ULTRASOUND AND FLUOROSCOPIC GUIDANCE  ANESTHESIA/SEDATION: 2.0 Mg IV Versed; 100 mcg IV Fentanyl  Total Moderate Sedation Time:  33 minutes  Additional Medications: 2 g IV Ancef. As antibiotic prophylaxis, Ancef was ordered pre-procedure and administered intravenously within one hour of incision.  FLUOROSCOPY TIME:  12 seconds.  PROCEDURE: The procedure, risks, benefits, and alternatives were explained to the patient. Questions regarding the procedure were encouraged and answered. The patient understands and consents to the procedure.  The right neck and chest were prepped with chlorhexidine in a sterile fashion, and a sterile drape was applied covering the operative field. Maximum barrier sterile technique with sterile gowns and gloves were used for the procedure. A time-out procedure was performed. Local anesthesia was provided with 1% lidocaine.  Ultrasound was used to confirm patency of the right internal jugular vein. After creating a small venotomy incision, a 21 gauge needle was advanced into the right internal jugular vein under direct, real-time ultrasound guidance. Ultrasound image documentation was performed. After securing guidewire access, an 8 Fr dilator was placed. A J-wire was kinked to measure appropriate catheter length.  A subcutaneous port pocket was then created along the upper chest wall utilizing sharp and blunt dissection. Portable cautery was utilized. The pocket was irrigated with sterile saline.  A single lumen power injectable port was chosen for placement. The 8 Fr catheter was tunneled from the port pocket site to the venotomy incision. The port was placed in the  pocket. External catheter was trimmed to appropriate length based on guidewire measurement.  At the venotomy, an 8 Fr peel-away sheath was placed over a guidewire. The catheter was then placed through the sheath and the sheath removed. Final catheter positioning was confirmed and documented with a fluoroscopic spot image. The port was accessed with a needle and aspirated and flushed with heparinized saline. The needle was removed.  The venotomy and port pocket incisions were closed with subcutaneous 3-0 Monocryl and subcuticular 4-0 Vicryl. Dermabond was applied to both incisions.  COMPLICATIONS: None  FINDINGS: After catheter placement, the tip lies at the cavoatrial junction. The catheter aspirates normally and is ready for immediate use.  IMPRESSION: Placement of single lumen port a cath via right internal jugular vein. The catheter tip lies at the cavoatrial junction. A power injectable port a cath was placed and is ready for immediate use.   Electronically Signed   By: Aletta Edouard M.D.   On: 09/15/2014 12:41   Ir US Guide Vasc Access Right  09/15/2014   CLINICAL DATA:  Metastatic adenocarcinoma presumably of lung primary. The patient requires a porta cath to begin chemotherapy.  EXAM: IMPLANTED PORT A CATH PLACEMENT WITH ULTRASOUND AND FLUOROSCOPIC GUIDANCE  ANESTHESIA/SEDATION: 2.0 Mg IV Versed; 100 mcg IV Fentanyl  Total Moderate Sedation Time:  33 minutes  Additional Medications: 2 g IV Ancef. As antibiotic prophylaxis, Ancef was ordered pre-procedure and administered intravenously within one hour of incision.  FLUOROSCOPY TIME:  12 seconds.  PROCEDURE: The procedure, risks, benefits, and alternatives were explained to the patient. Questions regarding the procedure were encouraged and answered. The patient understands and consents to the procedure.  The right neck and chest were prepped with chlorhexidine in a sterile fashion, and a sterile drape was applied covering the operative field. Maximum  barrier sterile technique with sterile gowns and gloves were used for the procedure. A time-out procedure was performed. Local anesthesia was provided with 1% lidocaine.  Ultrasound was used to confirm patency of the right internal jugular vein. After creating a small venotomy incision, a 21 gauge needle was advanced into the right internal jugular vein under direct,  real-time ultrasound guidance. Ultrasound image documentation was performed. After securing guidewire access, an 8 Fr dilator was placed. A J-wire was kinked to measure appropriate catheter length.  A subcutaneous port pocket was then created along the upper chest wall utilizing sharp and blunt dissection. Portable cautery was utilized. The pocket was irrigated with sterile saline.  A single lumen power injectable port was chosen for placement. The 8 Fr catheter was tunneled from the port pocket site to the venotomy incision. The port was placed in the pocket. External catheter was trimmed to appropriate length based on guidewire measurement.  At the venotomy, an 8 Fr peel-away sheath was placed over a guidewire. The catheter was then placed through the sheath and the sheath removed. Final catheter positioning was confirmed and documented with a fluoroscopic spot image. The port was accessed with a needle and aspirated and flushed with heparinized saline. The needle was removed.  The venotomy and port pocket incisions were closed with subcutaneous 3-0 Monocryl and subcuticular 4-0 Vicryl. Dermabond was applied to both incisions.  COMPLICATIONS: None  FINDINGS: After catheter placement, the tip lies at the cavoatrial junction. The catheter aspirates normally and is ready for immediate use.  IMPRESSION: Placement of single lumen port a cath via right internal jugular vein. The catheter tip lies at the cavoatrial junction. A power injectable port a cath was placed and is ready for immediate use.   Electronically Signed   By: Aletta Edouard M.D.   On:  09/15/2014 12:41   Ir Thoracentesis Asp Pleural Space W/img Guide  09/20/2014   INDICATION: Symptomatic right sided pleural effusion  EXAM: IR THORACENTESIS ASP PLEURAL SPACE W/IMG GUIDE  COMPARISON:  Chest radiograph- 09/19/2014; 09/15/2014; ultrasound-guided right-sided thoracentesis -09/15/2014  MEDICATIONS: None  COMPLICATIONS: None immediate  TECHNIQUE: Informed written consent was obtained from the patient after a discussion of the risks, benefits and alternatives to treatment. A timeout was performed prior to the initiation of the procedure.  Initial ultrasound scanning demonstrates a moderate sized right-sided pleural effusion. The lower chest was prepped and draped in the usual sterile fashion. 1% lidocaine was used for local anesthesia.  Under direct ultrasound guidance, a 19 gauge, 7-cm, Yueh catheter was introduced. An ultrasound image was saved for documentation purposes. The thoracentesis was performed. The catheter was removed and a dressing was applied. The patient tolerated the procedure well without immediate post procedural complication. The patient was escorted to have an upright chest radiograph.  FINDINGS: A total of approximately 800 cc of serous fluid was removed.  IMPRESSION: Successful ultrasound-guided right sided thoracentesis yielding 800 cc of pleural fluid.   Electronically Signed   By: Sandi Mariscal M.D.   On: 09/20/2014 15:54   US Thoracentesis Asp Pleural Space W/img Guide  09/15/2014   INDICATION: Metastatic lung adenocarcinoma, bilateral pleural effusions right greater than left. Request is made for therapeutic right thoracentesis .  EXAM: ULTRASOUND-GUIDED THERAPEUTIC RIGHT THORACENTESIS  COMPARISON:  PRIOR THORACENTESIS ON 09/11/2014  MEDICATIONS: None  COMPLICATIONS: None immediate  TECHNIQUE: Informed written consent was obtained from the patient after a discussion of the risks, benefits and alternatives to treatment. A timeout was performed prior to the initiation of the  procedure.  Initial ultrasound scanning demonstrates a moderate to large right pleural effusion. The lower chest was prepped and draped in the usual sterile fashion. 1% lidocaine was used for local anesthesia.  Under direct ultrasound guidance, a 19 gauge, 7-cm, Yueh catheter was introduced. An ultrasound image was saved for documentation purposes. The  thoracentesis was performed. The catheter was removed and a dressing was applied. The patient tolerated the procedure well without immediate post procedural complication. The patient was escorted to have an upright chest radiograph.  FINDINGS: A total of approximately 1.2 liters of turbid, yellow fluid was removed.  IMPRESSION: Successful ultrasound-guided therapeutic right sided thoracentesis yielding 1.2 liters of pleural fluid.  Read by: Rowe Robert, PA-C   Electronically Signed   By: Aletta Edouard M.D.   On: 09/15/2014 13:20   US Thoracentesis Asp Pleural Space W/img Guide  09/11/2014   CLINICAL DATA:  Shortness of breath, bilateral pleural effusions right greater than left. Request diagnostic and therapeutic thoracentesis.  EXAM: ULTRASOUND GUIDED RIGHT THORACENTESIS  COMPARISON:  None.  PROCEDURE: An ultrasound guided thoracentesis was thoroughly discussed with the patient and questions answered. The benefits, risks, alternatives and complications were also discussed. The patient understands and wishes to proceed with the procedure. Written consent was obtained.  Ultrasound was performed to localize and mark an adequate pocket of fluid in the right chest. The area was then prepped and draped in the normal sterile fashion. 1% Lidocaine was used for local anesthesia. Under ultrasound guidance, a 6 French Safe-T-Centesis catheter was introduced. Thoracentesis was performed. The catheter was removed and a dressing applied.  COMPLICATIONS: None  FINDINGS: A total of approximately 1.7 L of hazy, yellow fluid was removed. A fluid sample wassent for laboratory  analysis.  IMPRESSION: Successful ultrasound guided bright thoracentesis yielding 1.7 L of pleural fluid.  Read by: Ascencion Dike PA-C   Electronically Signed   By: Aletta Edouard M.D.   On: 09/11/2014 10:56    DISCHARGE EXAMINATION: Filed Vitals:   09/20/14 1440 09/20/14 2050 09/21/14 0535 09/21/14 0541  BP: 133/62 111/56  156/90  Pulse: 86 80  84  Temp: 98 F (36.7 C) 97.9 F (36.6 C)  98.1 F (36.7 C)  TempSrc: Oral Oral  Oral  Resp: 18 16  18   Height:      Weight:   82.7 kg (182 lb 5.1 oz)   SpO2: 96% 100%  99%   General appearance: alert, cooperative, cachectic and no distress Resp: decreased air entry at the bases. Cardio: regular rate and rhythm, S1, S2 normal, no murmur, click, rub or gallop GI: soft, non-tender; bowel sounds normal; no masses,  no organomegaly  DISPOSITION: SNF ([facility])  Discharge Instructions    Diet general    Complete by:  As directed      Discharge instructions    Complete by:  As directed   Next chemotherapy will be on 10/09/14 per Dr. Marin Olp.     Discharge wound care:    Complete by:  As directed   Barrier cream to buttocks and scrotum     Increase activity slowly    Complete by:  As directed            ALLERGIES: No Known Allergies  Current Discharge Medication List    START taking these medications   Details  acetaminophen (TYLENOL) 325 MG tablet Take 2 tablets (650 mg total) by mouth every 6 (six) hours as needed for mild pain (or Fever >/= 101).    carvedilol (COREG) 6.25 MG tablet Take 1 tablet (6.25 mg total) by mouth 2 (two) times daily with a meal.    dexamethasone (DECADRON) 4 MG tablet Take 1 tablet (4 mg total) by mouth every 12 (twelve) hours.   Associated Diagnoses: Metastatic lung carcinoma    dronabinol (MARINOL) 5 MG capsule  Take 1 capsule (5 mg total) by mouth 2 (two) times daily before lunch and supper.   Associated Diagnoses: Metastatic lung carcinoma    feeding supplement, GLUCERNA SHAKE, (GLUCERNA  SHAKE) LIQD Take 237 mLs by mouth 3 (three) times daily between meals. Refills: 0    folic acid (FOLVITE) 1 MG tablet Take 1 tablet (1 mg total) by mouth daily.   Associated Diagnoses: Metastatic lung carcinoma    furosemide (LASIX) 20 MG tablet Take 1 tablet (20 mg total) by mouth daily. Qty: 30 tablet    insulin aspart (NOVOLOG) 100 UNIT/ML injection 0-15 Units, Subcutaneous, 3 times daily with meals,  CBG 70 - 120: 0 units CBG 121 - 150: 2 units CBG 151 - 200: 3 units CBG 201 - 250: 5 units CBG 251 - 300: 8 units and call MD Qty: 10 mL, Refills: 11    insulin detemir (LEVEMIR) 100 UNIT/ML injection Inject 0.1 mLs (10 Units total) into the skin at bedtime. Qty: 10 mL, Refills: 11    isosorbide-hydrALAZINE (BIDIL) 20-37.5 MG per tablet Take 1 tablet by mouth 3 (three) times daily.    levalbuterol (XOPENEX) 0.63 MG/3ML nebulizer solution Take 3 mLs (0.63 mg total) by nebulization every 4 (four) hours as needed for wheezing or shortness of breath. Qty: 3 mL, Refills: 12    loperamide (IMODIUM) 2 MG capsule Take 1 capsule (2 mg total) by mouth every 4 (four) hours as needed for diarrhea or loose stools. Qty: 30 capsule, Refills: 0   Associated Diagnoses: Metastatic lung carcinoma    nicotine (NICODERM CQ - DOSED IN MG/24 HOURS) 21 mg/24hr patch Place 1 patch (21 mg total) onto the skin daily. Qty: 28 patch, Refills: 0    pantoprazole (PROTONIX) 40 MG tablet Take 1 tablet (40 mg total) by mouth daily.      CONTINUE these medications which have CHANGED   Details  clonazePAM (KLONOPIN) 0.5 MG tablet Take 1 tablet (0.5 mg total) by mouth 3 (three) times daily as needed for anxiety. Qty: 30 tablet, Refills: 0      CONTINUE these medications which have NOT CHANGED   Details  escitalopram (LEXAPRO) 10 MG tablet Take 10 mg by mouth daily.      STOP taking these medications     glipiZIDE (GLUCOTROL) 5 MG tablet      ibuprofen (ADVIL,MOTRIN) 200 MG tablet      metFORMIN  (GLUCOPHAGE) 500 MG tablet        Follow-up Information    Follow up with Volanda Napoleon, MD.   Specialty:  Oncology   Why:  Next chemo on 10/09/14. His office should call to set this up. If you dont hear from them please call his office.   Contact information:   [address] [place] [address] [phone]       Follow up with Sinclair Grooms, MD. Schedule an appointment as soon as possible for a visit in 3 weeks.   Specialty:  Cardiology   Why:  for CHF   Contact information:   [address] [place] [address] [phone]       TOTAL DISCHARGE TIME: 35 mins  [facility]  [facility] Pager [phone]  09/21/2014, 10:25 AM

## 2014-09-21 NOTE — Progress Notes (Signed)
Pt transferring to [facility], report called to [place]. Pt stable from morning assessment. Deaccessed portacath. No complaints at this time. Hortencia Conradi RN

## 2014-09-22 ENCOUNTER — Encounter (HOSPITAL_COMMUNITY): Payer: Self-pay

## 2014-09-25 LAB — GLUCOSE, CAPILLARY: GLUCOSE-CAPILLARY: 129 mg/dL — AB (ref 70–99)

## 2014-09-27 ENCOUNTER — Encounter (HOSPITAL_COMMUNITY): Payer: Self-pay

## 2014-09-28 ENCOUNTER — Telehealth: Payer: Self-pay | Admitting: *Deleted

## 2014-09-28 ENCOUNTER — Telehealth: Payer: Self-pay | Admitting: Hematology & Oncology

## 2014-09-28 ENCOUNTER — Other Ambulatory Visit: Payer: Self-pay | Admitting: Hematology & Oncology

## 2014-09-28 DIAGNOSIS — C7801 Secondary malignant neoplasm of right lung: Secondary | ICD-10-CM

## 2014-09-28 NOTE — Telephone Encounter (Signed)
Received notification from patients living facility that his counts are low.  WBC .8, Hgb 8.0, and platelets 51,000.  Reviewed with Dr. Marin Olp.  Patient appt made for next week for doctor appt and chemo

## 2014-09-28 NOTE — Telephone Encounter (Signed)
Per RN note and MD in basket I called [facility] [phone] spoke with Jacqlyn Larsen she said she would have someone call me back to schedule appointment for next week.

## 2014-09-29 ENCOUNTER — Telehealth: Payer: Self-pay | Admitting: Hematology & Oncology

## 2014-09-29 ENCOUNTER — Encounter: Payer: Self-pay | Admitting: *Deleted

## 2014-09-29 NOTE — Telephone Encounter (Signed)
Called nursing home again to schedule follow up for next week. Becky transferred me to medical records voice mail and said to leave message and they would call me back. I left detailed message.

## 2014-09-29 NOTE — Telephone Encounter (Signed)
I called again to schedule talked with Zac LPN he took down pt appointment and adress

## 2014-09-29 NOTE — Progress Notes (Signed)
[facility] called office to notify us of new infectious finding. Patient tested positive for c-diff. They have initiated treatment of vancomycin oral 125mg  BID for 14 days. [name] notified.

## 2014-10-02 ENCOUNTER — Telehealth: Payer: Self-pay | Admitting: Hematology & Oncology

## 2014-10-02 NOTE — Telephone Encounter (Signed)
Nursing home called moved 11-17 to 11-18

## 2014-10-03 ENCOUNTER — Other Ambulatory Visit: Payer: Self-pay | Admitting: Lab

## 2014-10-03 ENCOUNTER — Ambulatory Visit: Payer: Self-pay | Admitting: Family

## 2014-10-04 ENCOUNTER — Ambulatory Visit (HOSPITAL_BASED_OUTPATIENT_CLINIC_OR_DEPARTMENT_OTHER)
Admission: RE | Admit: 2014-10-04 | Discharge: 2014-10-04 | Disposition: A | Discharge status: Home / Self Care | Payer: Medicaid Other | Source: Ambulatory Visit | Attending: Family | Admitting: Family

## 2014-10-04 ENCOUNTER — Other Ambulatory Visit (HOSPITAL_BASED_OUTPATIENT_CLINIC_OR_DEPARTMENT_OTHER): Payer: Self-pay | Admitting: Lab

## 2014-10-04 ENCOUNTER — Ambulatory Visit (HOSPITAL_BASED_OUTPATIENT_CLINIC_OR_DEPARTMENT_OTHER): Payer: Self-pay | Admitting: Family

## 2014-10-04 ENCOUNTER — Encounter: Payer: Self-pay | Admitting: Family

## 2014-10-04 DIAGNOSIS — C7801 Secondary malignant neoplasm of right lung: Secondary | ICD-10-CM

## 2014-10-04 DIAGNOSIS — A047 Enterocolitis due to Clostridium difficile: Secondary | ICD-10-CM

## 2014-10-04 DIAGNOSIS — R0602 Shortness of breath: Secondary | ICD-10-CM | POA: Diagnosis present

## 2014-10-04 DIAGNOSIS — C3432 Malignant neoplasm of lower lobe, left bronchus or lung: Secondary | ICD-10-CM

## 2014-10-04 LAB — TECHNOLOGIST REVIEW CHCC SATELLITE

## 2014-10-04 LAB — CBC WITH DIFFERENTIAL (CANCER CENTER ONLY)
BASO#: 0 10*3/uL (ref 0.0–0.2)
BASO%: 0.2 % (ref 0.0–2.0)
EOS%: 0.2 % (ref 0.0–7.0)
Eosinophils Absolute: 0 10*3/uL (ref 0.0–0.5)
HCT: 24.6 % — ABNORMAL LOW (ref 38.7–49.9)
HGB: 8.1 g/dL — ABNORMAL LOW (ref 13.0–17.1)
LYMPH#: 1.1 10*3/uL (ref 0.9–3.3)
LYMPH%: 12.6 % — ABNORMAL LOW (ref 14.0–48.0)
MCH: 28.2 pg (ref 28.0–33.4)
MCHC: 32.9 g/dL (ref 32.0–35.9)
MCV: 86 fL (ref 82–98)
MONO#: 1.1 10*3/uL — ABNORMAL HIGH (ref 0.1–0.9)
MONO%: 13.4 % — ABNORMAL HIGH (ref 0.0–13.0)
NEUT%: 73.6 % (ref 40.0–80.0)
NEUTROS ABS: 6.1 10*3/uL (ref 1.5–6.5)
PLATELETS: 332 10*3/uL (ref 145–400)
RBC: 2.87 10*6/uL — ABNORMAL LOW (ref 4.20–5.70)
RDW: 16.2 % — AB (ref 11.1–15.7)
WBC: 8.3 10*3/uL (ref 4.0–10.0)

## 2014-10-04 NOTE — Progress Notes (Signed)
[place]  [phone] [phone]) [phone]  ID: [facility] Ralph [name] OB: [birth year] MR#: [id number] SHF#:[id number] Ralph [name] Care Team: No Pcp Per [facility] [name] as PCP - General (General Practice)  DIAGNOSIS: Metastatic lung cancer  INTERVAL HISTORY: [facility] [name] is here today for a follow-up after getting out of [facility] hospital. He had SOB earlier this month and was hospitalized. CT angio showed pleural effusions and a questionable nodule in Ralph left lower lobe. Biopsy confirmed that it was adenocarcinoma.  Thoracentesis on 11/4 removed 800 CC of fluid from his right lung.  We will repeat and xray today to make sure there is no more fluid accumulation.  He does not feel that his SOB is worse. He is on 2L [place] 24 hours a day for now.  He denies, fever, chills, n/v, cough, rash, headache, dizziness, chest pain, palpitations, abdominal pain, constipation, blood in urine or stool. He tested positive for C-diff and has been having some diarrhea. He is on antibiotics for this and states that it is getting better.  His first treatment was on 10/31 and did very well. He will be treated again next week.  No swelling, tenderness, numbness or tingling in his extremities.  His appetite is good and he is trying to drink plenty of fluids.  [facility] at [facility] and he is looking forward to getting back home.   CURRENT TREATMENT: Alimta/Carboplatin and Avastin q 21 days  REVIEW OF SYSTEMS: All other 10 point review of systems is negative.   PAST MEDICAL HISTORY: Past Medical History  Diagnosis Date  . Allergy   . Anxiety     followed [facility]iatry every six months.  . Hypertension   . Diabetes mellitus without complication   . Metastatic lung carcinoma 09/16/2014    PAST SURGICAL HISTORY: Past Surgical History  Procedure Laterality Date  . Rotator cuff surgery      Right  . Lymph node biopsy Right 09/12/2014    Procedure: RIGHT NECK LYMPH NODE BIOPSY;  Surgeon: Jackolyn Confer,  MD;  Location: [facility]S;  Service: General;  Laterality: Right;    FAMILY HISTORY Family History  Problem Relation Age of Onset  . Heart disease Mother     AMI  . Cancer Sister     breast  . Heart disease Brother     AMI  . Cancer Brother     GYNECOLOGIC HISTORY:  No LMP for male Ralph [name].   SOCIAL HISTORY: History   Social History  . Marital Status: Divorced    Spouse Name: N/A    Number of Children: N/A  . Years of Education: N/A   Occupational History  . Not on file.   Social History Main Topics  . Smoking status: Current Every Day Smoker -- 0.75 packs/day for 15 years    Types: Cigarettes  . Smokeless tobacco: Not on file  . Alcohol Use: No  . Drug Use: No  . Sexual Activity: Not on file   Other Topics Concern  . Not on file   Social History Narrative   Marital status: divorced; dating casually      Children: 2 grandchildren      Employment: truck driver x 15 years      Tobacco: 1 ppd      Alcohol:  Weekends      Drugs: none    ADVANCED DIRECTIVES:  <no information>  HEALTH MAINTENANCE: History  Substance Use Topics  . Smoking status: Current Every Day Smoker -- 0.75 packs/day for  15 years    Types: Cigarettes  . Smokeless tobacco: Not on file  . Alcohol Use: No   Colonoscopy: PAP: Bone density: Lipid panel:  No Known Allergies  Current Outpatient Prescriptions  Medication Sig Dispense Refill  . acetaminophen (TYLENOL) 325 MG tablet Take 2 tablets (650 mg total) by mouth every 6 (six) hours as needed for mild pain (or Fever >/= 101).    . carvedilol (COREG) 6.25 MG tablet Take 1 tablet (6.25 mg total) by mouth 2 (two) times daily with a meal.    . clonazePAM (KLONOPIN) 0.5 MG tablet Take 1 tablet (0.5 mg total) by mouth 3 (three) times daily as needed for anxiety. 30 tablet 0  . dexamethasone (DECADRON) 4 MG tablet Take 1 tablet (4 mg total) by mouth every 12 (twelve) hours.    [name] dronabinol (MARINOL) 5 MG capsule Take 1 capsule (5 mg total)  by mouth 2 (two) times daily before lunch and supper.    . escitalopram (LEXAPRO) 10 MG tablet Take 10 mg by mouth daily.    . feeding supplement, GLUCERNA SHAKE, (GLUCERNA SHAKE) LIQD Take 237 mLs by mouth 3 (three) times daily between meals.  0  . folic acid (FOLVITE) 1 MG tablet Take 1 tablet (1 mg total) by mouth daily.    . furosemide (LASIX) 20 MG tablet Take 1 tablet (20 mg total) by mouth daily. 30 tablet   . insulin aspart (NOVOLOG) 100 UNIT/ML injection 0-15 Units, Subcutaneous, 3 times daily with meals,  CBG 70 - 120: 0 units CBG 121 - 150: 2 units CBG 151 - 200: 3 units CBG 201 - 250: 5 units CBG 251 - 300: 8 units and call MD 10 mL 11  . insulin detemir (LEVEMIR) 100 UNIT/ML injection Inject 0.1 mLs (10 Units total) into Ralph skin at bedtime. 10 mL 11  . isosorbide-hydrALAZINE (BIDIL) 20-37.5 MG per tablet Take 1 tablet by mouth 3 (three) times daily.    [name] levalbuterol (XOPENEX) 0.63 MG/3ML nebulizer solution Take 3 mLs (0.63 mg total) by nebulization every 4 (four) hours as needed for wheezing or shortness of breath. 3 mL 12  . loperamide (IMODIUM) 2 MG capsule Take 1 capsule (2 mg total) by mouth every 4 (four) hours as needed for diarrhea or loose stools. 30 capsule 0  . nicotine (NICODERM CQ - DOSED IN MG/24 HOURS) 21 mg/24hr patch Place 1 patch (21 mg total) onto Ralph skin daily. 28 patch 0  . pantoprazole (PROTONIX) 40 MG tablet Take 1 tablet (40 mg total) by mouth daily.     No current facility-administered medications for this visit.    OBJECTIVE: Filed Vitals:   10/04/14 1516  BP: 139/71  Pulse: 81  Temp: 98.3 F (36.8 C)  Resp: 18    Filed Weights   10/04/14 1516  Weight: 180 lb (81.647 kg)   ECOG FS:2 - Symptomatic, <50% confined to bed Ocular: Sclerae unicteric, pupils equal, round and reactive to light Ear-nose-throat: Oropharynx clear, dentition fair Lymphatic: No cervical or supraclavicular adenopathy Lungs no rales or rhonchi, good excursion  bilaterally Heart regular rate and rhythm, no murmur appreciated Abd soft, nontender, positive bowel sounds MSK no focal spinal tenderness, no joint edema Neuro: non-focal, well-oriented, appropriate affect  LAB RESULTS: CMP     Component Value Date/Time   NA 135* 09/21/2014 0418   K 4.3 09/21/2014 0418   CL 102 09/21/2014 0418   CO2 24 09/21/2014 0418   GLUCOSE 209* 09/21/2014 0418   BUN  34* 09/21/2014 0418   CREATININE 1.76* 09/21/2014 0418   CREATININE 1.27 12/18/2012 1957   CALCIUM 7.6* 09/21/2014 0418   PROT 5.5* 09/11/2014 0400   ALBUMIN 2.6* 09/11/2014 0400   AST 17 09/11/2014 0400   ALT 12 09/11/2014 0400   ALKPHOS 98 09/11/2014 0400   BILITOT 0.3 09/11/2014 0400   GFRNONAA 40* 09/21/2014 0418   GFRAA 46* 09/21/2014 0418   INo results found for: SPEP, UPEP Lab Results  Component Value Date   WBC 8.3 10/04/2014   NEUTROABS 6.1 10/04/2014   HGB 8.1* 10/04/2014   HCT 24.6* 10/04/2014   MCV 86 10/04/2014   PLT 332 10/04/2014   No results found for: LABCA2 No components found for: [id number] No results for input(s): INR in Ralph last 168 hours.  STUDIES:  ASSESSMENT/PLAN: [facility] Ralph [name] is a very pleasant 63 yo African American male recently diagnosed with metastatic adenocarcinoma of Ralph lung.  He is feeling much better since leaving [facility] hospital. His diarrhea with Ralph Cdiff is getting better.  He is being treated with Alimta/Carboplatin and Avastin q 21 days. He did well with his first treatment on 10/13.  We will treat him again next week, he will get his new schedule today.  His Hgb today was 8.1 WBC 8.3 platelets 332.  He will also see Dr. Marin Olp before treatment next week.  We will see what his chest xray today shows.  He knows to call here with any questions or concerns and to go to [facility] ED in [facility] event of an emergency. We can certainly see him sooner if need be.   Eliezer Bottom, NP 10/04/2014 4:39 PM

## 2014-10-05 ENCOUNTER — Telehealth: Payer: Self-pay | Admitting: Hematology & Oncology

## 2014-10-05 LAB — COMPREHENSIVE METABOLIC PANEL
ALK PHOS: 59 U/L (ref 39–117)
ALT: 9 U/L (ref 0–53)
AST: 15 U/L (ref 0–37)
Albumin: 2.3 g/dL — ABNORMAL LOW (ref 3.5–5.2)
BILIRUBIN TOTAL: 0.2 mg/dL (ref 0.2–1.2)
BUN: 26 mg/dL — ABNORMAL HIGH (ref 6–23)
CO2: 26 mEq/L (ref 19–32)
Calcium: 7.6 mg/dL — ABNORMAL LOW (ref 8.4–10.5)
Chloride: 106 mEq/L (ref 96–112)
Creatinine, Ser: 1.34 mg/dL (ref 0.50–1.35)
Glucose, Bld: 131 mg/dL — ABNORMAL HIGH (ref 70–99)
Potassium: 4.1 mEq/L (ref 3.5–5.3)
SODIUM: 139 meq/L (ref 135–145)
TOTAL PROTEIN: 4.9 g/dL — AB (ref 6.0–8.3)

## 2014-10-05 LAB — LACTATE DEHYDROGENASE: LDH: 360 U/L — AB (ref 94–250)

## 2014-10-05 LAB — PREALBUMIN: PREALBUMIN: 17.6 mg/dL (ref 17.0–34.0)

## 2014-10-05 NOTE — Telephone Encounter (Signed)
Pt has NO INSURANCE. However, has a MEDICAID application pending w DSS. I also gave pt a [place] ([employer]) application to complete and return.  Pt's said he would get his sister who lives in [place] to help him complete the application to bring back.

## 2014-10-09 ENCOUNTER — Encounter: Payer: Self-pay | Admitting: Hematology & Oncology

## 2014-10-09 ENCOUNTER — Other Ambulatory Visit: Payer: Self-pay | Admitting: *Deleted

## 2014-10-09 ENCOUNTER — Ambulatory Visit (HOSPITAL_BASED_OUTPATIENT_CLINIC_OR_DEPARTMENT_OTHER): Payer: Self-pay | Admitting: Hematology & Oncology

## 2014-10-09 ENCOUNTER — Telehealth: Payer: Self-pay | Admitting: Hematology & Oncology

## 2014-10-09 ENCOUNTER — Ambulatory Visit (HOSPITAL_BASED_OUTPATIENT_CLINIC_OR_DEPARTMENT_OTHER): Payer: Self-pay | Admitting: Lab

## 2014-10-09 ENCOUNTER — Ambulatory Visit (HOSPITAL_BASED_OUTPATIENT_CLINIC_OR_DEPARTMENT_OTHER): Payer: Self-pay

## 2014-10-09 VITALS — BP 184/91 | HR 71 | Temp 97.6°F | Resp 18 | Ht 72.0 in | Wt 178.0 lb

## 2014-10-09 DIAGNOSIS — E119 Type 2 diabetes mellitus without complications: Secondary | ICD-10-CM

## 2014-10-09 DIAGNOSIS — C7802 Secondary malignant neoplasm of left lung: Secondary | ICD-10-CM

## 2014-10-09 DIAGNOSIS — C7801 Secondary malignant neoplasm of right lung: Secondary | ICD-10-CM

## 2014-10-09 DIAGNOSIS — B370 Candidal stomatitis: Secondary | ICD-10-CM

## 2014-10-09 DIAGNOSIS — Z5111 Encounter for antineoplastic chemotherapy: Secondary | ICD-10-CM

## 2014-10-09 DIAGNOSIS — Z720 Tobacco use: Secondary | ICD-10-CM

## 2014-10-09 DIAGNOSIS — C7800 Secondary malignant neoplasm of unspecified lung: Secondary | ICD-10-CM

## 2014-10-09 LAB — CMP (CANCER CENTER ONLY)
ALT: 13 U/L (ref 10–47)
AST: 18 U/L (ref 11–38)
Albumin: 2.1 g/dL — ABNORMAL LOW (ref 3.3–5.5)
Alkaline Phosphatase: 66 U/L (ref 26–84)
BUN, Bld: 30 mg/dL — ABNORMAL HIGH (ref 7–22)
CALCIUM: 8.4 mg/dL (ref 8.0–10.3)
CHLORIDE: 102 meq/L (ref 98–108)
CO2: 29 mEq/L (ref 18–33)
Creat: 1.4 mg/dl — ABNORMAL HIGH (ref 0.6–1.2)
Glucose, Bld: 248 mg/dL — ABNORMAL HIGH (ref 73–118)
POTASSIUM: 4.5 meq/L (ref 3.3–4.7)
Sodium: 144 mEq/L (ref 128–145)
Total Bilirubin: 0.4 mg/dl (ref 0.20–1.60)
Total Protein: 5.9 g/dL — ABNORMAL LOW (ref 6.4–8.1)

## 2014-10-09 LAB — CBC WITH DIFFERENTIAL (CANCER CENTER ONLY)
BASO#: 0.1 10*3/uL (ref 0.0–0.2)
BASO%: 0.3 % (ref 0.0–2.0)
EOS%: 0 % (ref 0.0–7.0)
Eosinophils Absolute: 0 10*3/uL (ref 0.0–0.5)
HEMATOCRIT: 29.4 % — AB (ref 38.7–49.9)
HGB: 9.6 g/dL — ABNORMAL LOW (ref 13.0–17.1)
LYMPH#: 1.3 10*3/uL (ref 0.9–3.3)
LYMPH%: 7.4 % — ABNORMAL LOW (ref 14.0–48.0)
MCH: 28.3 pg (ref 28.0–33.4)
MCHC: 32.7 g/dL (ref 32.0–35.9)
MCV: 87 fL (ref 82–98)
MONO#: 1.5 10*3/uL — AB (ref 0.1–0.9)
MONO%: 8.5 % (ref 0.0–13.0)
NEUT#: 14.5 10*3/uL — ABNORMAL HIGH (ref 1.5–6.5)
NEUT%: 83.8 % — AB (ref 40.0–80.0)
Platelets: 641 10*3/uL — ABNORMAL HIGH (ref 145–400)
RBC: 3.39 10*6/uL — ABNORMAL LOW (ref 4.20–5.70)
RDW: 18.2 % — ABNORMAL HIGH (ref 11.1–15.7)
WBC: 17.3 10*3/uL — AB (ref 4.0–10.0)

## 2014-10-09 LAB — UA PROTEIN, DIPSTICK - CHCC SATELLITE: Protein, Urine: 300 mg/dL

## 2014-10-09 MED ORDER — ONDANSETRON 16 MG/50ML IVPB (CHCC)
16.0000 mg | Freq: Once | INTRAVENOUS | Status: AC
  Administered 2014-10-09: 16 mg via INTRAVENOUS

## 2014-10-09 MED ORDER — FLUCONAZOLE 100 MG PO TABS: 100.0000 mg | ORAL_TABLET | Freq: Every day | ORAL | Status: DC

## 2014-10-09 MED ORDER — DEXAMETHASONE SODIUM PHOSPHATE 10 MG/ML IJ SOLN
10.0000 mg | Freq: Once | INTRAMUSCULAR | Status: AC
  Administered 2014-10-09: 10 mg via INTRAVENOUS

## 2014-10-09 MED ORDER — SODIUM CHLORIDE 0.9 % IV SOLN
400.0000 mg | Freq: Once | INTRAVENOUS | Status: AC
  Administered 2014-10-09: 400 mg via INTRAVENOUS
  Filled 2014-10-09: qty 40

## 2014-10-09 MED ORDER — LIDOCAINE-PRILOCAINE 2.5-2.5 % EX CREA: TOPICAL_CREAM | CUTANEOUS | Status: DC

## 2014-10-09 MED ORDER — SODIUM CHLORIDE 0.9 % IV SOLN
Freq: Once | INTRAVENOUS | Status: AC
  Administered 2014-10-09: 10:00:00 via INTRAVENOUS

## 2014-10-09 MED ORDER — SODIUM CHLORIDE 0.9 % IJ SOLN
10.0000 mL | INTRAMUSCULAR | Status: DC | PRN
  Administered 2014-10-09: 10 mL
  Filled 2014-10-09: qty 10

## 2014-10-09 MED ORDER — INSULIN REGULAR HUMAN 100 UNIT/ML IJ SOLN
10.0000 [IU] | Freq: Once | INTRAMUSCULAR | Status: DC
  Administered 2014-10-09: 10 [IU] via SUBCUTANEOUS

## 2014-10-09 MED ORDER — ONDANSETRON 16 MG/50ML IVPB (CHCC)
INTRAVENOUS | Status: AC
  Filled 2014-10-09: qty 16

## 2014-10-09 MED ORDER — FLUCONAZOLE 150 MG PO TABS
150.0000 mg | ORAL_TABLET | Freq: Once | ORAL | Status: AC
  Administered 2014-10-09: 150 mg via ORAL
  Filled 2014-10-09: qty 1

## 2014-10-09 MED ORDER — HEPARIN SOD (PORK) LOCK FLUSH 100 UNIT/ML IV SOLN
500.0000 [IU] | Freq: Once | INTRAVENOUS | Status: AC | PRN
  Administered 2014-10-09: 500 [IU]
  Filled 2014-10-09: qty 5

## 2014-10-09 MED ORDER — SODIUM CHLORIDE 0.9 % IV SOLN
1200.0000 mg | Freq: Once | INTRAVENOUS | Status: AC
  Administered 2014-10-09: 1200 mg via INTRAVENOUS
  Filled 2014-10-09: qty 48

## 2014-10-09 MED ORDER — DEXAMETHASONE 4 MG PO TABS: 4.0000 mg | ORAL_TABLET | Freq: Every day | ORAL | Status: DC

## 2014-10-09 MED ORDER — DEXAMETHASONE SODIUM PHOSPHATE 10 MG/ML IJ SOLN
INTRAMUSCULAR | Status: AC
  Filled 2014-10-09: qty 1

## 2014-10-09 MED ORDER — SODIUM CHLORIDE 0.9 % IV SOLN
375.0000 mg/m2 | Freq: Once | INTRAVENOUS | Status: AC
  Administered 2014-10-09: 775 mg via INTRAVENOUS
  Filled 2014-10-09: qty 31

## 2014-10-09 MED ORDER — LIDOCAINE-PRILOCAINE 2.5-2.5 % EX CREA: 1.0000 "application " | TOPICAL_CREAM | CUTANEOUS | Status: DC | PRN

## 2014-10-09 MED ORDER — INSULIN REGULAR HUMAN 100 UNIT/ML IJ SOLN: 10.0000 [IU] | Freq: Once | INTRAMUSCULAR | Status: DC

## 2014-10-09 NOTE — Progress Notes (Signed)
1:57 PM Spoke with Pam at [facility] about the med changes. Sent list with patient.  Verbalized understanding.

## 2014-10-09 NOTE — Patient Instructions (Signed)
Smoking Cessation Quitting smoking is important to your health and has many advantages. However, it is not always easy to quit since nicotine is a very addictive drug. Oftentimes, people try 3 times or more before being able to quit. This document explains the best ways for you to prepare to quit smoking. Quitting takes hard work and a lot of effort, but you can do it. ADVANTAGES OF QUITTING SMOKING  You will live longer, feel better, and live better.  Your body will feel the impact of quitting smoking almost immediately.  Within 20 minutes, blood pressure decreases. Your pulse returns to its normal level.  After 8 hours, carbon monoxide levels in the blood return to normal. Your oxygen level increases.  After 24 hours, the chance of having a heart attack starts to decrease. Your breath, hair, and body stop smelling like smoke.  After 48 hours, damaged nerve endings begin to recover. Your sense of taste and smell improve.  After 72 hours, the body is virtually free of nicotine. Your bronchial tubes relax and breathing becomes easier.  After 2 to 12 weeks, lungs can hold more air. Exercise becomes easier and circulation improves.  The risk of having a heart attack, stroke, cancer, or lung disease is greatly reduced.  After 1 year, the risk of coronary heart disease is cut in half.  After 5 years, the risk of stroke falls to the same as a nonsmoker.  After 10 years, the risk of lung cancer is cut in half and the risk of other cancers decreases significantly.  After 15 years, the risk of coronary heart disease drops, usually to the level of a nonsmoker.  If you are pregnant, quitting smoking will improve your chances of having a healthy baby.  The people you live with, especially any children, will be healthier.  You will have extra money to spend on things other than cigarettes. QUESTIONS TO THINK ABOUT BEFORE ATTEMPTING TO QUIT You may want to talk about your answers with your  health care provider.  Why do you want to quit?  If you tried to quit in the past, what helped and what did not?  What will be the most difficult situations for you after you quit? How will you plan to handle them?  Who can help you through the tough times? Your family? Friends? A health care provider?  What pleasures do you get from smoking? What ways can you still get pleasure if you quit? Here are some questions to ask your health care provider:  How can you help me to be successful at quitting?  What medicine do you think would be best for me and how should I take it?  What should I do if I need more help?  What is smoking withdrawal like? How can I get information on withdrawal? GET READY  Set a quit date.  Change your environment by getting rid of all cigarettes, ashtrays, matches, and lighters in your home, car, or work. Do not let people smoke in your home.  Review your past attempts to quit. Think about what worked and what did not. GET SUPPORT AND ENCOURAGEMENT You have a better chance of being successful if you have help. You can get support in many ways.  Tell your family, friends, and coworkers that you are going to quit and need their support. Ask them not to smoke around you.  Get individual, group, or telephone counseling and support. Programs are available at local hospitals and health centers. Call   your local health department for information about programs in your area.  Spiritual beliefs and practices may help some smokers quit.  Download a "quit meter" on your computer to keep track of quit statistics, such as how long you have gone without smoking, cigarettes not smoked, and money saved.  Get a self-help book about quitting smoking and staying off tobacco. LEARN NEW SKILLS AND BEHAVIORS  Distract yourself from urges to smoke. Talk to someone, go for a walk, or occupy your time with a task.  Change your normal routine. Take a different route to work.  Drink tea instead of coffee. Eat breakfast in a different place.  Reduce your stress. Take a hot bath, exercise, or read a book.  Plan something enjoyable to do every day. Reward yourself for not smoking.  Explore interactive web-based programs that specialize in helping you quit. GET MEDICINE AND USE IT CORRECTLY Medicines can help you stop smoking and decrease the urge to smoke. Combining medicine with the above behavioral methods and support can greatly increase your chances of successfully quitting smoking.  Nicotine replacement therapy helps deliver nicotine to your body without the negative effects and risks of smoking. Nicotine replacement therapy includes nicotine gum, lozenges, inhalers, nasal sprays, and skin patches. Some may be available over-the-counter and others require a prescription.  Antidepressant medicine helps people abstain from smoking, but how this works is unknown. This medicine is available by prescription.  Nicotinic receptor partial agonist medicine simulates the effect of nicotine in your brain. This medicine is available by prescription. Ask your health care provider for advice about which medicines to use and how to use them based on your health history. Your health care provider will tell you what side effects to look out for if you choose to be on a medicine or therapy. Carefully read the information on the package. Do not use any other product containing nicotine while using a nicotine replacement product.  RELAPSE OR DIFFICULT SITUATIONS Most relapses occur within the first 3 months after quitting. Do not be discouraged if you start smoking again. Remember, most people try several times before finally quitting. You may have symptoms of withdrawal because your body is used to nicotine. You may crave cigarettes, be irritable, feel very hungry, cough often, get headaches, or have difficulty concentrating. The withdrawal symptoms are only temporary. They are strongest  when you first quit, but they will go away within 10-14 days. To reduce the chances of relapse, try to:  Avoid drinking alcohol. Drinking lowers your chances of successfully quitting.  Reduce the amount of caffeine you consume. Once you quit smoking, the amount of caffeine in your body increases and can give you symptoms, such as a rapid heartbeat, sweating, and anxiety.  Avoid smokers because they can make you want to smoke.  Do not let weight gain distract you. Many smokers will gain weight when they quit, usually less than 10 pounds. Eat a healthy diet and stay active. You can always lose the weight gained after you quit.  Find ways to improve your mood other than smoking. FOR MORE INFORMATION  www.smokefree.gov  Document Released: 10/28/2001 Document Revised: 03/20/2014 Document Reviewed: 02/12/2012 [employer] Patient Information 2015 [employer]. This information is not intended to replace advice given to you by your health care provider. Make sure you discuss any questions you have with your health care provider.  

## 2014-10-09 NOTE — Patient Instructions (Signed)
[facility] Discharge Instructions for Patients Receiving Chemotherapy  Today you received the following chemotherapy agents Alimta, Carboplatin, and Avastin.  To help prevent nausea and vomiting after your treatment, we encourage you to take your nausea medication as directed.   If you develop nausea and vomiting that is not controlled by your nausea medication, call the clinic.   BELOW ARE SYMPTOMS THAT SHOULD BE REPORTED IMMEDIATELY:  *FEVER GREATER THAN 100.5 F  *CHILLS WITH OR WITHOUT FEVER  NAUSEA AND VOMITING THAT IS NOT CONTROLLED WITH YOUR NAUSEA MEDICATION  *UNUSUAL SHORTNESS OF BREATH  *UNUSUAL BRUISING OR BLEEDING  TENDERNESS IN MOUTH AND THROAT WITH OR WITHOUT PRESENCE OF ULCERS  *URINARY PROBLEMS  *BOWEL PROBLEMS  UNUSUAL RASH Items with * indicate a potential emergency and should be followed up as soon as possible.  Feel free to call the clinic you have any questions or concerns. The clinic phone number is [phone].   Pemetrexed injection What is this medicine? PEMETREXED (PEM e TREX ed) is a chemotherapy drug. This medicine affects cells that are rapidly growing, such as cancer cells and cells in your mouth and stomach. It is usually used to treat lung cancers like non-small cell lung cancer and mesothelioma. It may also be used to treat other cancers. This medicine may be used for other purposes; ask your health care provider or pharmacist if you have questions. COMMON BRAND NAME(S): Alimta What should I tell my health care provider before I take this medicine? They need to know if you have any of these conditions: -if you frequently drink alcohol containing beverages -infection (especially a virus infection such as chickenpox, cold sores, or herpes) -kidney disease -liver disease -low blood counts, like low platelets, red bloods, or white blood cells -an unusual or allergic reaction to pemetrexed, mannitol, other medicines, foods,  dyes, or preservatives -pregnant or trying to get pregnant -breast-feeding How should I use this medicine? This drug is given as an infusion into a vein. It is administered in a hospital or clinic by a specially trained health care professional. Talk to your pediatrician regarding the use of this medicine in children. Special care may be needed. Overdosage: If you think you have taken too much of this medicine contact a poison control center or emergency room at once. NOTE: This medicine is only for you. Do not share this medicine with others. What if I miss a dose? It is important not to miss your dose. Call your doctor or health care professional if you are unable to keep an appointment. What may interact with this medicine? -aspirin and aspirin-like medicines -medicines to increase blood counts like filgrastim, pegfilgrastim, sargramostim -methotrexate -NSAIDS, medicines for pain and inflammation, like ibuprofen or naproxen -probenecid -pyrimethamine -vaccines Talk to your doctor or health care professional before taking any of these medicines: -acetaminophen -aspirin -ibuprofen -ketoprofen -naproxen This list may not describe all possible interactions. Give your health care provider a list of all the medicines, herbs, non-prescription drugs, or dietary supplements you use. Also tell them if you smoke, drink alcohol, or use illegal drugs. Some items may interact with your medicine. What should I watch for while using this medicine? Visit your doctor for checks on your progress. This drug may make you feel generally unwell. This is not uncommon, as chemotherapy can affect healthy cells as well as cancer cells. Report any side effects. Continue your course of treatment even though you feel ill unless your doctor tells you to stop. In some  cases, you may be given additional medicines to help with side effects. Follow all directions for their use. Call your doctor or health care  professional for advice if you get a fever, chills or sore throat, or other symptoms of a cold or flu. Do not treat yourself. This drug decreases your body's ability to fight infections. Try to avoid being around people who are sick. This medicine may increase your risk to bruise or bleed. Call your doctor or health care professional if you notice any unusual bleeding. Be careful brushing and flossing your teeth or using a toothpick because you may get an infection or bleed more easily. If you have any dental work done, tell your dentist you are receiving this medicine. Avoid taking products that contain aspirin, acetaminophen, ibuprofen, naproxen, or ketoprofen unless instructed by your doctor. These medicines may hide a fever. Call your doctor or health care professional if you get diarrhea or mouth sores. Do not treat yourself. To protect your kidneys, drink water or other fluids as directed while you are taking this medicine. Men and women must use effective birth control while taking this medicine. You may also need to continue using effective birth control for a time after stopping this medicine. Do not become pregnant while taking this medicine. Tell your doctor right away if you think that you or your partner might be pregnant. There is a potential for serious side effects to an unborn child. Talk to your health care professional or pharmacist for more information. Do not breast-feed an infant while taking this medicine. This medicine may lower sperm counts. What side effects may I notice from receiving this medicine? Side effects that you should report to your doctor or health care professional as soon as possible: -allergic reactions like skin rash, itching or hives, swelling of the face, lips, or tongue -low blood counts - this medicine may decrease the number of white blood cells, red blood cells and platelets. You may be at increased risk for infections and bleeding. -signs of infection -  fever or chills, cough, sore throat, pain or difficulty passing urine -signs of decreased platelets or bleeding - bruising, pinpoint red spots on the skin, black, tarry stools, blood in the urine -signs of decreased red blood cells - unusually weak or tired, fainting spells, lightheadedness -breathing problems, like a dry cough -changes in emotions or moods -chest pain -confusion -diarrhea -high blood pressure -mouth or throat sores or ulcers -pain, swelling, warmth in the leg -pain on swallowing -swelling of the ankles, feet, hands -trouble passing urine or change in the amount of urine -vomiting -yellowing of the eyes or skin Side effects that usually do not require medical attention (report to your doctor or health care professional if they continue or are bothersome): -hair loss -loss of appetite -nausea -stomach upset This list may not describe all possible side effects. Call your doctor for medical advice about side effects. You may report side effects to FDA at 1-800-FDA-1088. Where should I keep my medicine? This drug is given in a hospital or clinic and will not be stored at home. NOTE: This sheet is a summary. It may not cover all possible information. If you have questions about this medicine, talk to your doctor, pharmacist, or health care provider.  2015, [employer]. (2008-06-06 13:24:03) Bevacizumab injection What is this medicine? BEVACIZUMAB (be va SIZ yoo mab) is a chemotherapy drug. It targets a protein found in many cancer cell types, and halts cancer growth. This drug treats  many cancers including non-small cell lung cancer, ovarian cancer, cervical cancer, and colon or rectal cancer. It is usually given with other chemotherapy drugs. This medicine may be used for other purposes; ask your health care provider or pharmacist if you have questions. COMMON BRAND NAME(S): Avastin What should I tell my health care provider before I take this medicine? They  need to know if you have any of these conditions: -blood clots -heart disease, including heart failure, heart attack, or chest pain (angina) -high blood pressure -infection (especially a virus infection such as chickenpox, cold sores, or herpes) -kidney disease -lung disease -prior chemotherapy with doxorubicin, daunorubicin, epirubicin, or other anthracycline type chemotherapy agents -recent or ongoing radiation therapy -recent surgery -stroke -an unusual or allergic reaction to bevacizumab, hamster proteins, mouse proteins, other medicines, foods, dyes, or preservatives -pregnant or trying to get pregnant -breast-feeding How should I use this medicine? This medicine is for infusion into a vein. It is given by a health care professional in a hospital or clinic setting. Talk to your pediatrician regarding the use of this medicine in children. Special care may be needed. Overdosage: If you think you have taken too much of this medicine contact a poison control center or emergency room at once. NOTE: This medicine is only for you. Do not share this medicine with others. What if I miss a dose? It is important not to miss your dose. Call your doctor or health care professional if you are unable to keep an appointment. What may interact with this medicine? Interactions are not expected. This list may not describe all possible interactions. Give your health care provider a list of all the medicines, herbs, non-prescription drugs, or dietary supplements you use. Also tell them if you smoke, drink alcohol, or use illegal drugs. Some items may interact with your medicine. What should I watch for while using this medicine? Your condition will be monitored carefully while you are receiving this medicine. You will need important blood work and urine testing done while you are taking this medicine. During your treatment, let your health care professional know if you have any unusual symptoms, such as  difficulty breathing. This medicine may rarely cause 'gastrointestinal perforation' (holes in the stomach, intestines or colon), a serious side effect requiring surgery to repair. This medicine should be started at least 28 days following major surgery and the site of the surgery should be totally healed. Check with your doctor before scheduling dental work or surgery while you are receiving this treatment. Talk to your doctor if you have recently had surgery or if you have a wound that has not healed. Do not become pregnant while taking this medicine. Women should inform their doctor if they wish to become pregnant or think they might be pregnant. There is a potential for serious side effects to an unborn child. Talk to your health care professional or pharmacist for more information. Do not breast-feed an infant while taking this medicine. This medicine has caused ovarian failure in some women. This medicine may interfere with the ability to have a child. You should talk to your doctor or health care professional if you are concerned about your fertility. What side effects may I notice from receiving this medicine? Side effects that you should report to your doctor or health care professional as soon as possible: -allergic reactions like skin rash, itching or hives, swelling of the face, lips, or tongue -signs of infection - fever or chills, cough, sore throat, pain or trouble  passing urine -signs of decreased platelets or bleeding - bruising, pinpoint red spots on the skin, black, tarry stools, nosebleeds, blood in the urine -breathing problems -changes in vision -chest pain -confusion -jaw pain, especially after dental work -mouth sores -seizures -severe abdominal pain -severe headache -sudden numbness or weakness of the face, arm or leg -swelling of legs or ankles -symptoms of a stroke: change in mental awareness, inability to talk or move one side of the body (especially in patients with  lung cancer) -trouble passing urine or change in the amount of urine -trouble speaking or understanding -trouble walking, dizziness, loss of balance or coordination Side effects that usually do not require medical attention (report to your doctor or health care professional if they continue or are bothersome): -constipation -diarrhea -dry skin -headache -loss of appetite -nausea, vomiting This list may not describe all possible side effects. Call your doctor for medical advice about side effects. You may report side effects to FDA at 1-800-FDA-1088. Where should I keep my medicine? This drug is given in a hospital or clinic and will not be stored at home. NOTE: This sheet is a summary. It may not cover all possible information. If you have questions about this medicine, talk to your doctor, pharmacist, or health care provider.  2015, [employer]. (2013-10-04 11:38:34) Carboplatin injection What is this medicine? CARBOPLATIN (KAR boe pla tin) is a chemotherapy drug. It targets fast dividing cells, like cancer cells, and causes these cells to die. This medicine is used to treat ovarian cancer and many other cancers. This medicine may be used for other purposes; ask your health care provider or pharmacist if you have questions. COMMON BRAND NAME(S): Paraplatin What should I tell my health care provider before I take this medicine? They need to know if you have any of these conditions: -blood disorders -hearing problems -kidney disease -recent or ongoing radiation therapy -an unusual or allergic reaction to carboplatin, cisplatin, other chemotherapy, other medicines, foods, dyes, or preservatives -pregnant or trying to get pregnant -breast-feeding How should I use this medicine? This drug is usually given as an infusion into a vein. It is administered in a hospital or clinic by a specially trained health care professional. Talk to your pediatrician regarding the use of this  medicine in children. Special care may be needed. Overdosage: If you think you have taken too much of this medicine contact a poison control center or emergency room at once. NOTE: This medicine is only for you. Do not share this medicine with others. What if I miss a dose? It is important not to miss a dose. Call your doctor or health care professional if you are unable to keep an appointment. What may interact with this medicine? -medicines for seizures -medicines to increase blood counts like filgrastim, pegfilgrastim, sargramostim -some antibiotics like amikacin, gentamicin, neomycin, streptomycin, tobramycin -vaccines Talk to your doctor or health care professional before taking any of these medicines: -acetaminophen -aspirin -ibuprofen -ketoprofen -naproxen This list may not describe all possible interactions. Give your health care provider a list of all the medicines, herbs, non-prescription drugs, or dietary supplements you use. Also tell them if you smoke, drink alcohol, or use illegal drugs. Some items may interact with your medicine. What should I watch for while using this medicine? Your condition will be monitored carefully while you are receiving this medicine. You will need important blood work done while you are taking this medicine. This drug may make you feel generally unwell. This is not uncommon,  as chemotherapy can affect healthy cells as well as cancer cells. Report any side effects. Continue your course of treatment even though you feel ill unless your doctor tells you to stop. In some cases, you may be given additional medicines to help with side effects. Follow all directions for their use. Call your doctor or health care professional for advice if you get a fever, chills or sore throat, or other symptoms of a cold or flu. Do not treat yourself. This drug decreases your body's ability to fight infections. Try to avoid being around people who are sick. This medicine may  increase your risk to bruise or bleed. Call your doctor or health care professional if you notice any unusual bleeding. Be careful brushing and flossing your teeth or using a toothpick because you may get an infection or bleed more easily. If you have any dental work done, tell your dentist you are receiving this medicine. Avoid taking products that contain aspirin, acetaminophen, ibuprofen, naproxen, or ketoprofen unless instructed by your doctor. These medicines may hide a fever. Do not become pregnant while taking this medicine. Women should inform their doctor if they wish to become pregnant or think they might be pregnant. There is a potential for serious side effects to an unborn child. Talk to your health care professional or pharmacist for more information. Do not breast-feed an infant while taking this medicine. What side effects may I notice from receiving this medicine? Side effects that you should report to your doctor or health care professional as soon as possible: -allergic reactions like skin rash, itching or hives, swelling of the face, lips, or tongue -signs of infection - fever or chills, cough, sore throat, pain or difficulty passing urine -signs of decreased platelets or bleeding - bruising, pinpoint red spots on the skin, black, tarry stools, nosebleeds -signs of decreased red blood cells - unusually weak or tired, fainting spells, lightheadedness -breathing problems -changes in hearing -changes in vision -chest pain -high blood pressure -low blood counts - This drug may decrease the number of white blood cells, red blood cells and platelets. You may be at increased risk for infections and bleeding. -nausea and vomiting -pain, swelling, redness or irritation at the injection site -pain, tingling, numbness in the hands or feet -problems with balance, talking, walking -trouble passing urine or change in the amount of urine Side effects that usually do not require medical  attention (report to your doctor or health care professional if they continue or are bothersome): -hair loss -loss of appetite -metallic taste in the mouth or changes in taste This list may not describe all possible side effects. Call your doctor for medical advice about side effects. You may report side effects to FDA at 1-800-FDA-1088. Where should I keep my medicine? This drug is given in a hospital or clinic and will not be stored at home. NOTE: This sheet is a summary. It may not cover all possible information. If you have questions about this medicine, talk to your doctor, pharmacist, or health care provider.  2015, [employer]. (2008-02-08 14:38:05)

## 2014-10-09 NOTE — Progress Notes (Signed)
Hematology and Oncology Follow Up Visit  DENNYS GUIN [id number] Aug 27, 1951 63 y.o. 10/09/2014   Principle Diagnosis:   Metastatic adenocarcinoma of the lung - WILD TYPE     Current Therapy:    Status post cycle 1 of carboplatin/Alimta/Avastin       Zometa 4 mg IV every 3 weeks    Interim History:  Mr.  Rimel is in for follow-up. He sees her doing pretty well. He still is in a rehabilitation center. He still is getting a lot of rehabilitation. He is still quite weak. His overall performance status is ECOG 2.  He is not complaining of any shortness of breath. He does come in with some oxygen.  We did do a chest x-ray on him today. He's does have some pleural effusion. It looks a low bit better than when he was in the hospital.  He does have diabetes. He is on insulin. His blood sugars are being watched at the rehabilitation center. I think we can probably cut his Decadron down to 4 mg a day. This will help his blood sugars.  He does have low bit of thrush. Again this probably is because of his hyperglycemia. We will give him some Diflucan.  His son and niece came in with him. They told me, in private, that he is still smoking. I talked to him today about this. I told him that smoking cataracts the effects of chemotherapy and that if we are trying to get him better, he has to stop smoking. He is on a nicotine patch. He does smoke with a nicotine patch on. This probably is why the blood pressure is high.  His appetite is doing better. We will try to stop the Marinol.  He's had no diarrhea or constipation.      Medications: Current outpatient prescriptions: acetaminophen (TYLENOL) 325 MG tablet, Take 2 tablets (650 mg total) by mouth every 6 (six) hours as needed for mild pain (or Fever >/= 101)., Disp: , Rfl: ;  carvedilol (COREG) 6.25 MG tablet, Take 1 tablet (6.25 mg total) by mouth 2 (two) times daily with a meal., Disp: , Rfl:  clonazePAM (KLONOPIN) 0.5 MG tablet, Take 1  tablet (0.5 mg total) by mouth 3 (three) times daily as needed for anxiety., Disp: 30 tablet, Rfl: 0;  dexamethasone (DECADRON) 4 MG tablet, Take 1 tablet (4 mg total) by mouth daily., Disp: , Rfl: ;  escitalopram (LEXAPRO) 10 MG tablet, Take 10 mg by mouth daily., Disp: , Rfl: ;  folic acid (FOLVITE) 1 MG tablet, Take 1 tablet (1 mg total) by mouth daily., Disp: , Rfl:  furosemide (LASIX) 20 MG tablet, Take 1 tablet (20 mg total) by mouth daily. (Patient taking differently: Take 40 mg by mouth daily. ), Disp: 30 tablet, Rfl: ;  insulin aspart (NOVOLOG) 100 UNIT/ML injection, 0-15 Units, Subcutaneous, 3 times daily with meals,  CBG 70 - 120: 0 units CBG 121 - 150: 2 units CBG 151 - 200: 3 units CBG 201 - 250: 5 units CBG 251 - 300: 8 units and call MD, Disp: 10 mL, Rfl: 11 isosorbide-hydrALAZINE (BIDIL) 20-37.5 MG per tablet, Take 1 tablet by mouth 3 (three) times daily., Disp: , Rfl: ;  levalbuterol (XOPENEX) 0.63 MG/3ML nebulizer solution, Take 3 mLs (0.63 mg total) by nebulization every 4 (four) hours as needed for wheezing or shortness of breath., Disp: 3 mL, Rfl: 12 loperamide (IMODIUM) 2 MG capsule, Take 1 capsule (2 mg total) by mouth every 4 (four)  hours as needed for diarrhea or loose stools., Disp: 30 capsule, Rfl: 0;  nicotine (NICODERM CQ - DOSED IN MG/24 HOURS) 21 mg/24hr patch, Place 1 patch (21 mg total) onto the skin daily., Disp: 28 patch, Rfl: 0;  fluconazole (DIFLUCAN) 100 MG tablet, Take 1 tablet (100 mg total) by mouth daily., Disp: 30 tablet, Rfl: 4 insulin glargine (LANTUS) 100 UNIT/ML injection, Inject 10 Units into the skin at bedtime., Disp: , Rfl: ;  lidocaine-prilocaine (EMLA) cream, Apply 1 application topically as needed., Disp: 30 g, Rfl: 0;  omeprazole (PRILOSEC) 20 MG capsule, Take 20 mg by mouth daily., Disp: , Rfl: ;  potassium chloride (K-DUR,KLOR-CON) 10 MEQ tablet, Take 10 mEq by mouth daily., Disp: , Rfl:   Allergies: No Known Allergies  Past Medical History, Surgical  history, Social history, and Family History were reviewed and updated.  Review of Systems: As above  Physical Exam:  height is 6' (1.829 m) and weight is 178 lb (80.74 kg). His oral temperature is 97.6 F (36.4 C). His blood pressure is 184/91 and his pulse is 71. His respiration is 18 and oxygen saturation is 100%.   Chronically ill-appearing African-American gentleman in no obvious distress. Head and neck exam shows no ocular or oral lesions. He has oral thrush. There is no adenopathy in the neck. Lungs are with decent breath sounds bilaterally. He has no rales, wheezes or rhonchi. Cardiac exam is regular rate and rhythm with a normal S1 and S2. There are no murmurs, rubs or bruits. Abdomen is soft. He has good bowel sounds. There is no fluid wave. There is no palpable liver or spleen tip. Back exam no tenderness over the spine, ribs or hips. Extremities shows no clubbing, cyanosis or edema. Skin exam shows no rashes. Neurological exam is nonfocal.        Lab Results  Component Value Date   WBC 17.3* 10/09/2014   HGB 9.6* 10/09/2014   HCT 29.4* 10/09/2014   MCV 87 10/09/2014   PLT 641* 10/09/2014     Chemistry      Component Value Date/Time   NA 144 10/09/2014 0851   NA 139 10/04/2014 1507   K 4.5 10/09/2014 0851   K 4.1 10/04/2014 1507   CL 102 10/09/2014 0851   CL 106 10/04/2014 1507   CO2 29 10/09/2014 0851   CO2 26 10/04/2014 1507   BUN 30* 10/09/2014 0851   BUN 26* 10/04/2014 1507   CREATININE 1.4* 10/09/2014 0851   CREATININE 1.34 10/04/2014 1507      Component Value Date/Time   CALCIUM 8.4 10/09/2014 0851   CALCIUM 7.6* 10/04/2014 1507   ALKPHOS 66 10/09/2014 0851   ALKPHOS 59 10/04/2014 1507   AST 18 10/09/2014 0851   AST 15 10/04/2014 1507   ALT 13 10/09/2014 0851   ALT 9 10/04/2014 1507   BILITOT 0.40 10/09/2014 0851   BILITOT 0.2 10/04/2014 1507         Impression and Plan: Mr. Aloia is 63 year old gentleman with metastatic adenocarcinoma of the  lung. This was a biopsy proven. He is on systemic chemotherapy. Clinically, he actually looks a little bit better. As such, I would like to think that the chemotherapy is working. He has not required any thoracentesis. When he was in the hospital, he was having a thoracentesis every 3 days.  We will go ahead and treat him today. I will add Avastin to his program. I think this will help. I don't see any obvious contraindication to  using Avastin.  We will go ahead and plan for follow-up scans in about 2 weeks. This will give [place] an idea as to how well things are going. Again, I have to believe that he is responding.  Hopefully, his blood sugars will be a little better when we see him back.  I spent about 35-40 minutes with he and his family in the office.  Volanda Napoleon, MD 11/23/20155:03 PM

## 2014-10-09 NOTE — Telephone Encounter (Signed)
Pam nurse at [facility] aware of 12-8 CT to be NPO 4 hrs and they will bring pt at 8 am to drink contrast. Vaughan Basta in radiology aware.

## 2014-10-24 ENCOUNTER — Encounter (HOSPITAL_BASED_OUTPATIENT_CLINIC_OR_DEPARTMENT_OTHER): Payer: Self-pay

## 2014-10-24 ENCOUNTER — Ambulatory Visit (HOSPITAL_BASED_OUTPATIENT_CLINIC_OR_DEPARTMENT_OTHER)
Admission: RE | Admit: 2014-10-24 | Discharge: 2014-10-24 | Disposition: A | Discharge status: Home / Self Care | Payer: Medicaid Other | Source: Ambulatory Visit | Attending: Hematology & Oncology | Admitting: Hematology & Oncology

## 2014-10-24 DIAGNOSIS — Z9221 Personal history of antineoplastic chemotherapy: Secondary | ICD-10-CM | POA: Insufficient documentation

## 2014-10-24 DIAGNOSIS — C3490 Malignant neoplasm of unspecified part of unspecified bronchus or lung: Secondary | ICD-10-CM | POA: Diagnosis present

## 2014-10-24 DIAGNOSIS — R188 Other ascites: Secondary | ICD-10-CM | POA: Insufficient documentation

## 2014-10-24 DIAGNOSIS — C7802 Secondary malignant neoplasm of left lung: Secondary | ICD-10-CM

## 2014-10-24 DIAGNOSIS — E119 Type 2 diabetes mellitus without complications: Secondary | ICD-10-CM

## 2014-10-24 MED ORDER — IOHEXOL 300 MG/ML  SOLN
100.0000 mL | Freq: Once | INTRAMUSCULAR | Status: AC | PRN
  Administered 2014-10-24: 80 mL via INTRAVENOUS

## 2014-10-25 ENCOUNTER — Telehealth: Payer: Self-pay | Admitting: *Deleted

## 2014-10-25 NOTE — Telephone Encounter (Signed)
-----   Message from Ralph Napoleon, MD sent at 10/25/2014  7:24 AM EST ----- Call - cancer is responding!!! It is smaller!! Ralph [name]

## 2014-10-26 ENCOUNTER — Ambulatory Visit (INDEPENDENT_AMBULATORY_CARE_PROVIDER_SITE_OTHER): Payer: Self-pay | Admitting: Physician Assistant

## 2014-10-26 ENCOUNTER — Encounter: Payer: Self-pay | Admitting: Physician Assistant

## 2014-10-26 VITALS — BP 140/70 | HR 92 | Ht 72.0 in | Wt 185.0 lb

## 2014-10-26 DIAGNOSIS — C3490 Malignant neoplasm of unspecified part of unspecified bronchus or lung: Secondary | ICD-10-CM

## 2014-10-26 DIAGNOSIS — C799 Secondary malignant neoplasm of unspecified site: Secondary | ICD-10-CM

## 2014-10-26 DIAGNOSIS — I429 Cardiomyopathy, unspecified: Secondary | ICD-10-CM

## 2014-10-26 DIAGNOSIS — N189 Chronic kidney disease, unspecified: Secondary | ICD-10-CM

## 2014-10-26 DIAGNOSIS — E118 Type 2 diabetes mellitus with unspecified complications: Secondary | ICD-10-CM

## 2014-10-26 DIAGNOSIS — I5022 Chronic systolic (congestive) heart failure: Secondary | ICD-10-CM

## 2014-10-26 DIAGNOSIS — I10 Essential (primary) hypertension: Secondary | ICD-10-CM

## 2014-10-26 DIAGNOSIS — Z720 Tobacco use: Secondary | ICD-10-CM

## 2014-10-26 MED ORDER — CARVEDILOL 6.25 MG PO TABS: 9.3750 mg | ORAL_TABLET | Freq: Two times a day (BID) | ORAL | Status: DC

## 2014-10-26 NOTE — Patient Instructions (Signed)
Your physician recommends that you return for lab work in: [place] has recommended you make the following change in your medication: [place] TO 9.375 [place]  Your physician recommends that you schedule a follow-up appointment in: Thursday, 01-05-15 @ 11:10AM [place], PAC

## 2014-10-26 NOTE — Progress Notes (Signed)
Cardiology Office Note   Date:  10/26/2014   ID:  Ralph [name], DOB Oct 25, 1951, MRN [id number]  PCP:  No PCP Per Patient  Cardiologist:  Dr. [name]     History of Present Illness: Ralph [name] is a 63 y.o. male with a hx of DM2, HTN, CKD, tobacco abuse.  He was admitted in October (10/25-11/5) with acute systolic HF with EF 41-96%.  He apparently had a cardiac cath in [facility] in April 2015 that was reportedly normal.  He was diuresed and HF medications were titrated. ACEI/ARB was avoided due to CKD. He had worsening Creatinine with diuresis (in setting of recent contrast load) and Lasix had to be held.   Chest CT on admit demonstrated no pulmonary embolism, but there was extensive thoracic and upper abdominal adenopathy and bilateral, R>L, pleural effusions.  He underwent US guided thoracentesis x 3.  LN biopsy was done and c/w adenocarcinoma (probably from the lung).  Pleural fluid cytology was malignant cells of adenocarcinoma.  He was seen by oncology (Dr. Marin Olp) and received 1 cycle of chemotherapy.  Of note, MRI Brain with multiple brain lesion stroke versus metastasic disease.    During hospital stay, he was noted to have abnormal ABI with R 0.58 and L 1.14 and evidence of R FA occlusion.    He returns for FU.  He is staying at [facility] (SNF).  He is doing PT.  Denies chest pain.  Denies significant dyspnea.  He is overall NYHA 2-2b.  Denies orthopnea, PND.  LE edema is stable.  Denies syncope.    Studies:   - Echo (10/15):  EF 20-25%, diff HK, Gr 1 DD, mod LAE, small eff, L pleural eff   Recent Labs: 09/11/2014: TSH 3.620 09/12/2014: Pro B Natriuretic peptide (BNP) 8587.0* 10/09/2014: ALT 13; BUN 30*; Creatinine 1.4*; Hemoglobin 9.6*; Potassium 4.5; Sodium 144    Recent Radiology: Dg Chest 2 View   10/04/2014     IMPRESSION: Perhaps slight reduction in volume of pleural effusions right larger than left.   Electronically Signed   By: Ivar Drape  M.D.   On: 10/04/2014 16:47   Ct Chest W Contrast    10/24/2014    IMPRESSION: 1. Interval response to therapy. There is been decrease in size of mediastinal and hilar adenopathy. There is also been interval improvement an upper abdominal adenopathy. 2. Improvement in right pleural effusion. The pulmonary nodule in the left lung has decreased in size from the previous exam. 3. No new or progressive disease identified. 4. Ascites and diffuse body wall edema.   Electronically Signed   By: Kerby Moors M.D.   On: 10/24/2014 14:20   Ct Abdomen Pelvis W Contrast  10/24/2014     IMPRESSION: 1. Interval response to therapy. There is been decrease in size of mediastinal and hilar adenopathy. There is also been interval improvement an upper abdominal adenopathy. 2. Improvement in right pleural effusion. The pulmonary nodule in the left lung has decreased in size from the previous exam. 3. No new or progressive disease identified. 4. Ascites and diffuse body wall edema.   Electronically Signed   By: Kerby Moors M.D.   On: 10/24/2014 14:20      Wt Readings from Last 3 Encounters:  10/26/14 185 lb (83.915 kg)  10/09/14 178 lb (80.74 kg)  10/04/14 180 lb (81.647 kg)     Past Medical History  Diagnosis Date  . Allergy   . Anxiety  followed [facility] every six months.  . Hypertension   . Diabetes mellitus without complication   . Metastatic lung carcinoma 09/16/2014    Current Outpatient Prescriptions  Medication Sig Dispense Refill  . acetaminophen (TYLENOL) 325 MG tablet Take 2 tablets (650 mg total) by mouth every 6 (six) hours as needed for mild pain (or Fever >/= 101).    . carvedilol (COREG) 6.25 MG tablet Take 1 tablet (6.25 mg total) by mouth 2 (two) times daily with a meal.    . clonazePAM (KLONOPIN) 0.5 MG tablet Take 1 tablet (0.5 mg total) by mouth 3 (three) times daily as needed for anxiety. 30 tablet 0  . dexamethasone (DECADRON) 4 MG tablet Take 1 tablet (4 mg total) by  mouth daily.    [name] escitalopram (LEXAPRO) 10 MG tablet Take 10 mg by mouth daily.    . fluconazole (DIFLUCAN) 100 MG tablet Take 1 tablet (100 mg total) by mouth daily. 30 tablet 4  . folic acid (FOLVITE) 1 MG tablet Take 1 tablet (1 mg total) by mouth daily.    . furosemide (LASIX) 20 MG tablet Take 1 tablet (20 mg total) by mouth daily. (Patient taking differently: Take 40 mg by mouth daily. ) 30 tablet   . insulin aspart (NOVOLOG) 100 UNIT/ML injection 0-15 Units, Subcutaneous, 3 times daily with meals,  CBG 70 - 120: 0 units CBG 121 - 150: 2 units CBG 151 - 200: 3 units CBG 201 - 250: 5 units CBG 251 - 300: 8 units and call MD 10 mL 11  . insulin glargine (LANTUS) 100 UNIT/ML injection Inject 10 Units into the skin at bedtime.    . isosorbide-hydrALAZINE (BIDIL) 20-37.5 MG per tablet Take 1 tablet by mouth 3 (three) times daily.    [name] levalbuterol (XOPENEX) 0.63 MG/3ML nebulizer solution Take 3 mLs (0.63 mg total) by nebulization every 4 (four) hours as needed for wheezing or shortness of breath. 3 mL 12  . lidocaine-prilocaine (EMLA) cream Apply 1 application topically as needed. 30 g 0  . loperamide (IMODIUM) 2 MG capsule Take 1 capsule (2 mg total) by mouth every 4 (four) hours as needed for diarrhea or loose stools. 30 capsule 0  . nicotine (NICODERM CQ - DOSED IN MG/24 HOURS) 21 mg/24hr patch Place 1 patch (21 mg total) onto the skin daily. 28 patch 0  . omeprazole (PRILOSEC) 20 MG capsule Take 20 mg by mouth daily.    . potassium chloride (K-DUR,KLOR-CON) 10 MEQ tablet Take 10 mEq by mouth daily.     No current facility-administered medications for this visit.     Allergies:   Review of patient's allergies indicates no known allergies.   Social History:  The patient  reports that he has been smoking Cigarettes.  He has a 11.25 pack-year smoking history. He does not have any smokeless tobacco history on file. He reports that he does not drink alcohol or use illicit drugs.   Family  History:  The patient's family history includes Cancer in his brother and sister; Heart disease in his brother and mother.    ROS:  Please see the history of present illness.    All other systems reviewed and negative.    PHYSICAL EXAM: VS:  BP 140/70 mmHg  Pulse 92  Ht 6' (1.829 m)  Wt 185 lb (83.915 kg)  BMI 25.08 kg/m2  SpO2  Well nourished, well developed, in no acute distressOn 02 HEENT: normal Neck: no JVD at 90 degrees Cardiac:  normal  S1, S2;  RRR; no murmur   Lungs:   Decreased breath sounds bilaterally, no wheezing, rhonchi or rales Abd: soft, nontender, no hepatomegaly Ext: trace bilateral LE edema Skin: warm and dry Neuro:  CNs 2-12 intact, no focal abnormalities noted  EKG:  NSR, HR 92, normal axis, inf Q waves, inf-lat TWI, similar to prior tracings      ASSESSMENT AND PLAN:  1.  Chronic Systolic CHF:  Volume appears stable.  Continue current dose of Lasix.  Check BMET today. 2.  Cardiomyopathy:  Etiology not clear.  Patient notes he had a heart cath in 04/2014 at [facility].    -  Continue beta blocker, nitrates, hydralazine.    -  Increase Coreg to 9.375 mg bid.    -  Suspect he is not a candidate for aggressive evaluation or intervention given malignancy.   3.  Hypertension:  Borderline control.  Increase Coreg today.   4.  Metastatic Lung CA:  FU with oncology as planned.  6.  Diabetes Mellitus:  FU with PCP.   7.  Tobacco abuse:  He is trying to quit.  Disposition:   FU with me in 1 month.     Signed, Versie Starks, MHS 10/26/2014 3:25 PM    [facility] [place], [place], [place]  [address] Phone: [phone]; Fax: [phone]

## 2014-10-27 ENCOUNTER — Other Ambulatory Visit: Payer: Self-pay | Admitting: *Deleted

## 2014-10-27 ENCOUNTER — Telehealth: Payer: Self-pay | Admitting: Physician Assistant

## 2014-10-27 LAB — BASIC METABOLIC PANEL
BUN: 40 mg/dL — ABNORMAL HIGH (ref 6–23)
CALCIUM: 7.2 mg/dL — AB (ref 8.4–10.5)
CO2: 26 mEq/L (ref 19–32)
CREATININE: 1.8 mg/dL — AB (ref 0.4–1.5)
Chloride: 103 mEq/L (ref 96–112)
GFR: 50.61 mL/min — ABNORMAL LOW (ref >60.00)
Glucose, Bld: 330 mg/dL — ABNORMAL HIGH (ref 70–99)
Potassium: 4.7 mEq/L (ref 3.5–5.1)
Sodium: 135 mEq/L (ref 135–145)

## 2014-10-27 MED ORDER — FUROSEMIDE 20 MG PO TABS: 20.0000 mg | ORAL_TABLET | Freq: Every day | ORAL | Status: DC

## 2014-10-27 NOTE — Telephone Encounter (Signed)
New Message         Nurse calling w/ questions in regards to orders given by Richardson Dopp when pt was seen yesterday. Please call back and advise.

## 2014-10-27 NOTE — Telephone Encounter (Signed)
I rtnd call back to [facility] and s/w [place]. RN was calling to confirm orders if lab was done in our office yesterday and also were we getitn recordds from [facility] or did they need to ge them. I said we were geting rcds. I also took fax # [phone] to fax over lab results from yesterday.

## 2014-10-27 NOTE — Telephone Encounter (Signed)
I rtnd call back to [facility] and s/w [place]. RN was calling to confirm orders if lab was done in our office yesterday and also were we getitn recordds from [facility] or did they need to ge them. I said we were geting rcds. I also took fax # [phone] to fax over lab results from yesterday.

## 2014-10-27 NOTE — Telephone Encounter (Signed)
ROI faxed to [facility] @ [phone]  12.11.15/km

## 2014-10-30 ENCOUNTER — Telehealth: Payer: Self-pay | Admitting: *Deleted

## 2014-10-30 ENCOUNTER — Ambulatory Visit (HOSPITAL_COMMUNITY)
Admission: RE | Admit: 2014-10-30 | Discharge: 2014-10-30 | Disposition: A | Discharge status: Home / Self Care | Payer: Medicaid Other | Source: Ambulatory Visit | Attending: Hematology & Oncology | Admitting: Hematology & Oncology

## 2014-10-30 ENCOUNTER — Other Ambulatory Visit (HOSPITAL_BASED_OUTPATIENT_CLINIC_OR_DEPARTMENT_OTHER): Payer: Self-pay | Admitting: Lab

## 2014-10-30 ENCOUNTER — Encounter: Payer: Self-pay | Admitting: Family

## 2014-10-30 ENCOUNTER — Ambulatory Visit (HOSPITAL_BASED_OUTPATIENT_CLINIC_OR_DEPARTMENT_OTHER): Payer: Self-pay | Admitting: Family

## 2014-10-30 ENCOUNTER — Ambulatory Visit (HOSPITAL_BASED_OUTPATIENT_CLINIC_OR_DEPARTMENT_OTHER): Payer: Self-pay

## 2014-10-30 VITALS — BP 145/71 | HR 77 | Temp 97.5°F | Resp 18 | Ht 70.0 in | Wt 190.0 lb

## 2014-10-30 DIAGNOSIS — C770 Secondary and unspecified malignant neoplasm of lymph nodes of head, face and neck: Secondary | ICD-10-CM

## 2014-10-30 DIAGNOSIS — C3402 Malignant neoplasm of left main bronchus: Secondary | ICD-10-CM

## 2014-10-30 DIAGNOSIS — D630 Anemia in neoplastic disease: Secondary | ICD-10-CM | POA: Diagnosis not present

## 2014-10-30 DIAGNOSIS — C3490 Malignant neoplasm of unspecified part of unspecified bronchus or lung: Secondary | ICD-10-CM

## 2014-10-30 DIAGNOSIS — C799 Secondary malignant neoplasm of unspecified site: Secondary | ICD-10-CM | POA: Diagnosis not present

## 2014-10-30 DIAGNOSIS — D649 Anemia, unspecified: Secondary | ICD-10-CM

## 2014-10-30 DIAGNOSIS — C7801 Secondary malignant neoplasm of right lung: Secondary | ICD-10-CM

## 2014-10-30 DIAGNOSIS — R0602 Shortness of breath: Secondary | ICD-10-CM

## 2014-10-30 DIAGNOSIS — C7802 Secondary malignant neoplasm of left lung: Secondary | ICD-10-CM

## 2014-10-30 DIAGNOSIS — C7800 Secondary malignant neoplasm of unspecified lung: Secondary | ICD-10-CM

## 2014-10-30 DIAGNOSIS — E119 Type 2 diabetes mellitus without complications: Secondary | ICD-10-CM

## 2014-10-30 LAB — CBC WITH DIFFERENTIAL (CANCER CENTER ONLY)
BASO#: 0 10*3/uL (ref 0.0–0.2)
BASO%: 0.3 % (ref 0.0–2.0)
EOS%: 0.2 % (ref 0.0–7.0)
Eosinophils Absolute: 0 10*3/uL (ref 0.0–0.5)
HCT: 20.8 % — ABNORMAL LOW (ref 38.7–49.9)
HGB: 6.6 g/dL — CL (ref 13.0–17.1)
LYMPH#: 1.3 10*3/uL (ref 0.9–3.3)
LYMPH%: 19.7 % (ref 14.0–48.0)
MCH: 29.1 pg (ref 28.0–33.4)
MCHC: 31.7 g/dL — ABNORMAL LOW (ref 32.0–35.9)
MCV: 92 fL (ref 82–98)
MONO#: 0.7 10*3/uL (ref 0.1–0.9)
MONO%: 10.9 % (ref 0.0–13.0)
NEUT#: 4.5 10*3/uL (ref 1.5–6.5)
NEUT%: 68.9 % (ref 40.0–80.0)
Platelets: 424 10*3/uL — ABNORMAL HIGH (ref 145–400)
RBC: 2.27 10*6/uL — ABNORMAL LOW (ref 4.20–5.70)
RDW: 20.8 % — ABNORMAL HIGH (ref 11.1–15.7)
WBC: 6.6 10*3/uL (ref 4.0–10.0)

## 2014-10-30 LAB — CMP (CANCER CENTER ONLY)
ALBUMIN: 1.6 g/dL — AB (ref 3.3–5.5)
ALT: 14 U/L (ref 10–47)
AST: 20 U/L (ref 11–38)
Alkaline Phosphatase: 55 U/L (ref 26–84)
BUN, Bld: 41 mg/dL — ABNORMAL HIGH (ref 7–22)
CO2: 30 mEq/L (ref 18–33)
Calcium: 7.7 mg/dL — ABNORMAL LOW (ref 8.0–10.3)
Chloride: 101 mEq/L (ref 98–108)
Creat: 1.8 mg/dl — ABNORMAL HIGH (ref 0.6–1.2)
Glucose, Bld: 175 mg/dL — ABNORMAL HIGH (ref 73–118)
Potassium: 4.4 mEq/L (ref 3.3–4.7)
SODIUM: 145 meq/L (ref 128–145)
TOTAL PROTEIN: 5.2 g/dL — AB (ref 6.4–8.1)
Total Bilirubin: 0.4 mg/dl (ref 0.20–1.60)

## 2014-10-30 LAB — PREALBUMIN: Prealbumin: 18.6 mg/dL (ref 17.0–34.0)

## 2014-10-30 LAB — HOLD TUBE, BLOOD BANK - CHCC SATELLITE

## 2014-10-30 LAB — TECHNOLOGIST REVIEW CHCC SATELLITE

## 2014-10-30 MED ORDER — SODIUM CHLORIDE 0.9 % IV SOLN
Freq: Once | INTRAVENOUS | Status: AC
  Administered 2014-10-30: 12:00:00 via INTRAVENOUS

## 2014-10-30 MED ORDER — SODIUM CHLORIDE 0.9 % IJ SOLN
10.0000 mL | INTRAMUSCULAR | Status: DC | PRN
  Administered 2014-10-30: 10 mL via INTRAVENOUS
  Filled 2014-10-30: qty 10

## 2014-10-30 MED ORDER — HEPARIN SOD (PORK) LOCK FLUSH 100 UNIT/ML IV SOLN
500.0000 [IU] | Freq: Once | INTRAVENOUS | Status: AC
  Administered 2014-10-30: 500 [IU] via INTRAVENOUS
  Filled 2014-10-30: qty 5

## 2014-10-30 NOTE — Progress Notes (Signed)
[place]  Telephone:([phone]) [phone] Fax:([phone]) [phone]  ID: Howell Pringle OB: [birth year] MR#: [id number] Patient Care Team: No Pcp Per Patient as PCP - General (General Practice)  DIAGNOSIS: Metastatic lung cancer  INTERVAL HISTORY: Mr. Handel is here today for a follow-up and treatment. He states that he feels good. His Hgb is 6.6 so we will set him up to get 2 units of blood here on Thursday. We will postpone his next treatment until next week.  He still has some SOB and wears 2L O2 as needed. He hasn't been needing it as often.  He denies, fever, chills, n/v, cough, rash, headache, dizziness, chest pain, palpitations, abdominal pain, constipation, diarrhea,  blood in urine or stool.  His bottom has healed nicely from where he had C-diff and it irritated his skin. He is keeping the area clean and dry and it is no longer painful.   No tenderness, numbness or tingling in his extremities. He has some chronic swelling in his ankles and takes daily to help with this.  His appetite is good and he is trying to drink plenty of fluids. His weight is stable. We will give him fluids today.  He is still at the SNF and hopes to be going home soon. He had a good report from PT today.   CURRENT TREATMENT: Alimta/Carboplatin and Avastin q 21 days  REVIEW OF SYSTEMS: All other 10 point review of systems is negative.   PAST MEDICAL HISTORY: Past Medical History  Diagnosis Date  . Allergy   . Anxiety     followed [facility] every six months.  . Hypertension   . Diabetes mellitus without complication   . Metastatic lung carcinoma 09/16/2014    PAST SURGICAL HISTORY: Past Surgical History  Procedure Laterality Date  . Rotator cuff surgery      Right  . Lymph node biopsy Right 09/12/2014    Procedure: RIGHT NECK LYMPH NODE BIOPSY;  Surgeon: Jackolyn Confer, MD;  Location: [facility];  Service: General;  Laterality: Right;    FAMILY HISTORY Family History   Problem Relation Age of Onset  . Heart disease Mother     AMI  . Cancer Sister     breast  . Heart disease Brother     AMI  . Cancer Brother     GYNECOLOGIC HISTORY:  No LMP for male patient.   SOCIAL HISTORY: History   Social History  . Marital Status: Divorced    Spouse Name: N/A    Number of Children: N/A  . Years of Education: N/A   Occupational History  . Not on file.   Social History Main Topics  . Smoking status: Current Every Day Smoker -- 0.75 packs/day for 15 years    Types: Cigarettes  . Smokeless tobacco: Not on file  . Alcohol Use: No  . Drug Use: No  . Sexual Activity: Not on file   Other Topics Concern  . Not on file   Social History Narrative   Marital status: divorced; dating casually      Children: 2 grandchildren      Employment: truck driver x 15 years      Tobacco: 1 ppd      Alcohol:  Weekends      Drugs: none    ADVANCED DIRECTIVES:  <no information>  HEALTH MAINTENANCE: History  Substance Use Topics  . Smoking status: Current Every Day Smoker -- 0.75 packs/day for 15 years    Types: Cigarettes  .  Smokeless tobacco: Not on file  . Alcohol Use: No   Colonoscopy: PAP: Bone density: Lipid panel:  No Known Allergies  Current Outpatient Prescriptions  Medication Sig Dispense Refill  . acetaminophen (TYLENOL) 325 MG tablet Take 2 tablets (650 mg total) by mouth every 6 (six) hours as needed for mild pain (or Fever >/= 101). (Patient not taking: Reported on 10/26/2014)    . carvedilol (COREG) 6.25 MG tablet Take 1.5 tablets (9.375 mg total) by mouth 2 (two) times daily with a meal. 90 tablet 6  . clonazePAM (KLONOPIN) 0.5 MG tablet Take 1 tablet (0.5 mg total) by mouth 3 (three) times daily as needed for anxiety. 30 tablet 0  . dexamethasone (DECADRON) 4 MG tablet Take 1 tablet (4 mg total) by mouth daily.    [name] escitalopram (LEXAPRO) 10 MG tablet Take 10 mg by mouth daily.    . folic acid (FOLVITE) 1 MG tablet Take 1 tablet (1 mg  total) by mouth daily.    . furosemide (LASIX) 20 MG tablet Take 1 tablet (20 mg total) by mouth daily. 30 tablet 6  . insulin aspart (NOVOLOG) 100 UNIT/ML injection 0-15 Units, Subcutaneous, 3 times daily with meals,  CBG 70 - 120: 0 units CBG 121 - 150: 2 units CBG 151 - 200: 3 units CBG 201 - 250: 5 units CBG 251 - 300: 8 units and call MD 10 mL 11  . insulin glargine (LANTUS) 100 UNIT/ML injection Inject 10 Units into the skin at bedtime.    . isosorbide-hydrALAZINE (BIDIL) 20-37.5 MG per tablet Take 1 tablet by mouth 3 (three) times daily.    [name] levalbuterol (XOPENEX) 0.63 MG/3ML nebulizer solution Take 3 mLs (0.63 mg total) by nebulization every 4 (four) hours as needed for wheezing or shortness of breath. 3 mL 12  . lidocaine-prilocaine (EMLA) cream Apply 1 application topically as needed. 30 g 0  . loperamide (IMODIUM) 2 MG capsule Take 1 capsule (2 mg total) by mouth every 4 (four) hours as needed for diarrhea or loose stools. (Patient not taking: Reported on 10/26/2014) 30 capsule 0  . nicotine (NICODERM CQ - DOSED IN MG/24 HOURS) 21 mg/24hr patch Place 1 patch (21 mg total) onto the skin daily. 28 patch 0  . omeprazole (PRILOSEC) 20 MG capsule Take 20 mg by mouth daily.     No current facility-administered medications for this visit.    OBJECTIVE: There were no vitals filed for this visit.  There were no vitals filed for this visit. ECOG FS:1 - Symptomatic but completely ambulatory Ocular: Sclerae unicteric, pupils equal, round and reactive to light Ear-nose-throat: Oropharynx clear, dentition fair Lymphatic: No cervical or supraclavicular adenopathy Lungs no rales or rhonchi, good excursion bilaterally Heart regular rate and rhythm, no murmur appreciated Abd soft, nontender, positive bowel sounds MSK no focal spinal tenderness, no joint edema Neuro: non-focal, well-oriented, appropriate affect  LAB RESULTS: CMP     Component Value Date/Time   NA 135 10/26/2014 1629   NA  144 10/09/2014 0851   K 4.7 10/26/2014 1629   K 4.5 10/09/2014 0851   CL 103 10/26/2014 1629   CL 102 10/09/2014 0851   CO2 26 10/26/2014 1629   CO2 29 10/09/2014 0851   GLUCOSE 330* 10/26/2014 1629   GLUCOSE 248* 10/09/2014 0851   BUN 40* 10/26/2014 1629   BUN 30* 10/09/2014 0851   CREATININE 1.8* 10/26/2014 1629   CREATININE 1.4* 10/09/2014 0851   CALCIUM 7.2* 10/26/2014 1629   CALCIUM 8.4  10/09/2014 0851   PROT 5.9* 10/09/2014 0851   PROT 4.9* 10/04/2014 1507   ALBUMIN 2.3* 10/04/2014 1507   AST 18 10/09/2014 0851   AST 15 10/04/2014 1507   ALT 13 10/09/2014 0851   ALT 9 10/04/2014 1507   ALKPHOS 66 10/09/2014 0851   ALKPHOS 59 10/04/2014 1507   BILITOT 0.40 10/09/2014 0851   BILITOT 0.2 10/04/2014 1507   GFRNONAA 40* 09/21/2014 0418   GFRAA 46* 09/21/2014 0418   INo results found for: SPEP, UPEP Lab Results  Component Value Date   WBC 17.3* 10/09/2014   NEUTROABS 14.5* 10/09/2014   HGB 9.6* 10/09/2014   HCT 29.4* 10/09/2014   MCV 87 10/09/2014   PLT 641* 10/09/2014   No results found for: LABCA2 No components found for: [id number] No results for input(s): INR in the last 168 hours.  STUDIES:  ASSESSMENT/PLAN: Mr. Cavenaugh is a very pleasant 63 yo African American male recently diagnosed with metastatic adenocarcinoma of the lung. He is feeling good today and has no complaints.  His Hgb today is 6.6. We have arranged for him to come in on Thursday and get 2 units PRBC's.  We will hold off on Alimta/Carboplatin and Avastin and treat him next week instead.  He has his appointment and treatment schedule. His SNF is also aware if his appointments for transportation purposes.  He knows to call here with any questions or concerns and to go to the ED in the event of an emergency. We can certainly see him sooner if need be.   Eliezer Bottom, NP 10/30/2014 11:02 AM

## 2014-10-30 NOTE — Patient Instructions (Addendum)
Ralph [name] will need to be at the [facility] @ 0830 on Thursday November 02, 2014. He will received 2 units of blood for a hemoglobin of 6.6. This appointment will take him aprox. 5 hours. Please apply his Emla cream to his port site 1 hour prior to his appointment time (0730). If you have any questions or  Concerns please contact our office at [phone].  Dehydration, Adult Dehydration means your body does not have as much fluid as it needs. Your kidneys, brain, and heart will not work properly without the right amount of fluids and salt.  HOME CARE  Ask your doctor how to replace body fluid losses (rehydrate).  Drink enough fluids to keep your pee (urine) clear or pale yellow.  Drink small amounts of fluids often if you feel sick to your stomach (nauseous) or throw up (vomit).  Eat like you normally do.  Avoid:  Foods or drinks high in sugar.  Bubbly (carbonated) drinks.  Juice.  Very hot or cold fluids.  Drinks with caffeine.  Fatty, greasy foods.  Alcohol.  Tobacco.  Eating too much.  Gelatin desserts.  Wash your hands to avoid spreading germs (bacteria, viruses).  Only take medicine as told by your doctor.  Keep all doctor visits as told. GET HELP RIGHT AWAY IF:   You cannot drink something without throwing up.  You get worse even with treatment.  Your vomit has blood in it or looks greenish.  Your poop (stool) has blood in it or looks black and tarry.  You have not peed in 6 to 8 hours.  You pee a small amount of very dark pee.  You have a fever.  You pass out (faint).  You have belly (abdominal) pain that gets worse or stays in one spot (localizes).  You have a rash, stiff neck, or bad headache.  You get easily annoyed, sleepy, or are hard to wake up.  You feel weak, dizzy, or very thirsty. MAKE SURE YOU:   Understand these instructions.  Will watch your condition.  Will get help right away if you are not doing  well or get worse. Document Released: 08/30/2009 Document Revised: 01/26/2012 Document Reviewed: 06/23/2011 [facility] Patient Information 2015 [place], [place]. This information is not intended to replace advice given to you by your health care provider. Make sure you discuss any questions you have with your health care provider.

## 2014-10-30 NOTE — Telephone Encounter (Addendum)
Left message  ----- Message from Volanda Napoleon, MD sent at 10/25/2014  7:24 AM EST ----- Call - cancer is responding!!! It is smaller!! Laurey Arrow

## 2014-10-30 NOTE — Patient Instructions (Signed)
Smoking Cessation Quitting smoking is important to your health and has many advantages. However, it is not always easy to quit since nicotine is a very addictive drug. Oftentimes, people try 3 times or more before being able to quit. This document explains the best ways for you to prepare to quit smoking. Quitting takes hard work and a lot of effort, but you can do it. ADVANTAGES OF QUITTING SMOKING  You will live longer, feel better, and live better.  Your body will feel the impact of quitting smoking almost immediately.  Within 20 minutes, blood pressure decreases. Your pulse returns to its normal level.  After 8 hours, carbon monoxide levels in the blood return to normal. Your oxygen level increases.  After 24 hours, the chance of having a heart attack starts to decrease. Your breath, hair, and body stop smelling like smoke.  After 48 hours, damaged nerve endings begin to recover. Your sense of taste and smell improve.  After 72 hours, the body is virtually free of nicotine. Your bronchial tubes relax and breathing becomes easier.  After 2 to 12 weeks, lungs can hold more air. Exercise becomes easier and circulation improves.  The risk of having a heart attack, stroke, cancer, or lung disease is greatly reduced.  After 1 year, the risk of coronary heart disease is cut in half.  After 5 years, the risk of stroke falls to the same as a nonsmoker.  After 10 years, the risk of lung cancer is cut in half and the risk of other cancers decreases significantly.  After 15 years, the risk of coronary heart disease drops, usually to the level of a nonsmoker.  If you are pregnant, quitting smoking will improve your chances of having a healthy baby.  The people you live with, especially any children, will be healthier.  You will have extra money to spend on things other than cigarettes. QUESTIONS TO THINK ABOUT BEFORE ATTEMPTING TO QUIT You may want to talk about your answers with your  health care provider.  Why do you want to quit?  If you tried to quit in the past, what helped and what did not?  What will be the most difficult situations for you after you quit? How will you plan to handle them?  Who can help you through the tough times? Your family? Friends? A health care provider?  What pleasures do you get from smoking? What ways can you still get pleasure if you quit? Here are some questions to ask your health care provider:  How can you help me to be successful at quitting?  What medicine do you think would be best for me and how should I take it?  What should I do if I need more help?  What is smoking withdrawal like? How can I get information on withdrawal? GET READY  Set a quit date.  Change your environment by getting rid of all cigarettes, ashtrays, matches, and lighters in your home, car, or work. Do not let people smoke in your home.  Review your past attempts to quit. Think about what worked and what did not. GET SUPPORT AND ENCOURAGEMENT You have a better chance of being successful if you have help. You can get support in many ways.  Tell your family, friends, and coworkers that you are going to quit and need their support. Ask them not to smoke around you.  Get individual, group, or telephone counseling and support. Programs are available at local hospitals and health centers. Call   your local health department for information about programs in your area.  Spiritual beliefs and practices may help some smokers quit.  Download a "quit meter" on your computer to keep track of quit statistics, such as how long you have gone without smoking, cigarettes not smoked, and money saved.  Get a self-help book about quitting smoking and staying off tobacco. LEARN NEW SKILLS AND BEHAVIORS  Distract yourself from urges to smoke. Talk to someone, go for a walk, or occupy your time with a task.  Change your normal routine. Take a different route to work.  Drink tea instead of coffee. Eat breakfast in a different place.  Reduce your stress. Take a hot bath, exercise, or read a book.  Plan something enjoyable to do every day. Reward yourself for not smoking.  Explore interactive web-based programs that specialize in helping you quit. GET MEDICINE AND USE IT CORRECTLY Medicines can help you stop smoking and decrease the urge to smoke. Combining medicine with the above behavioral methods and support can greatly increase your chances of successfully quitting smoking.  Nicotine replacement therapy helps deliver nicotine to your body without the negative effects and risks of smoking. Nicotine replacement therapy includes nicotine gum, lozenges, inhalers, nasal sprays, and skin patches. Some may be available over-the-counter and others require a prescription.  Antidepressant medicine helps people abstain from smoking, but how this works is unknown. This medicine is available by prescription.  Nicotinic receptor partial agonist medicine simulates the effect of nicotine in your brain. This medicine is available by prescription. Ask your health care provider for advice about which medicines to use and how to use them based on your health history. Your health care provider will tell you what side effects to look out for if you choose to be on a medicine or therapy. Carefully read the information on the package. Do not use any other product containing nicotine while using a nicotine replacement product.  RELAPSE OR DIFFICULT SITUATIONS Most relapses occur within the first 3 months after quitting. Do not be discouraged if you start smoking again. Remember, most people try several times before finally quitting. You may have symptoms of withdrawal because your body is used to nicotine. You may crave cigarettes, be irritable, feel very hungry, cough often, get headaches, or have difficulty concentrating. The withdrawal symptoms are only temporary. They are strongest  when you first quit, but they will go away within 10-14 days. To reduce the chances of relapse, try to:  Avoid drinking alcohol. Drinking lowers your chances of successfully quitting.  Reduce the amount of caffeine you consume. Once you quit smoking, the amount of caffeine in your body increases and can give you symptoms, such as a rapid heartbeat, sweating, and anxiety.  Avoid smokers because they can make you want to smoke.  Do not let weight gain distract you. Many smokers will gain weight when they quit, usually less than 10 pounds. Eat a healthy diet and stay active. You can always lose the weight gained after you quit.  Find ways to improve your mood other than smoking. FOR MORE INFORMATION  www.smokefree.gov  Document Released: 10/28/2001 Document Revised: 03/20/2014 Document Reviewed: 02/12/2012 [employer] Patient Information 2015 [employer]. This information is not intended to replace advice given to you by your health care provider. Make sure you discuss any questions you have with your health care provider.  

## 2014-10-31 LAB — PREPARE RBC (CROSSMATCH)

## 2014-10-31 LAB — ABO/RH: ABO/RH(D): A POS

## 2014-11-01 ENCOUNTER — Other Ambulatory Visit: Payer: Self-pay | Admitting: *Deleted

## 2014-11-01 ENCOUNTER — Telehealth: Payer: Self-pay | Admitting: Physician Assistant

## 2014-11-01 DIAGNOSIS — I5021 Acute systolic (congestive) heart failure: Secondary | ICD-10-CM

## 2014-11-01 NOTE — Telephone Encounter (Signed)
New Msg    Ermalene Postin, RN calling from [facility] would like a call back at [phone] station #1.    No details given

## 2014-11-01 NOTE — Telephone Encounter (Signed)
I rtnd call to Ermalene Postin, RN at  SNF who was asking if bmp could be done 12/17 instead of 1/18 while pt will be getting Iron Infusion due to Hgb 6 per his oncologist. I said that was fine. Advise I rcv'd lab results yesterday for PA to review, Creatinine 1.80, BUN 48. I advised I will fax back any recommendations; RN gave me fax # [phone] atten: Suzi Roots.

## 2014-11-02 ENCOUNTER — Encounter: Payer: Self-pay | Admitting: Hematology & Oncology

## 2014-11-02 ENCOUNTER — Ambulatory Visit (HOSPITAL_BASED_OUTPATIENT_CLINIC_OR_DEPARTMENT_OTHER): Payer: Self-pay

## 2014-11-02 ENCOUNTER — Other Ambulatory Visit: Payer: Self-pay | Admitting: Lab

## 2014-11-02 VITALS — BP 182/84 | HR 80 | Temp 97.9°F | Resp 18

## 2014-11-02 DIAGNOSIS — C3490 Malignant neoplasm of unspecified part of unspecified bronchus or lung: Secondary | ICD-10-CM | POA: Diagnosis not present

## 2014-11-02 DIAGNOSIS — I5021 Acute systolic (congestive) heart failure: Secondary | ICD-10-CM

## 2014-11-02 DIAGNOSIS — D649 Anemia, unspecified: Secondary | ICD-10-CM

## 2014-11-02 LAB — BRAIN NATRIURETIC PEPTIDE: BRAIN NATRIURETIC PEPTIDE: 506.1 pg/mL — AB (ref 0.0–100.0)

## 2014-11-02 MED ORDER — FUROSEMIDE 10 MG/ML IJ SOLN
20.0000 mg | Freq: Once | INTRAMUSCULAR | Status: AC
  Administered 2014-11-02: 20 mg via INTRAVENOUS

## 2014-11-02 MED ORDER — DIPHENHYDRAMINE HCL 25 MG PO CAPS
25.0000 mg | ORAL_CAPSULE | Freq: Once | ORAL | Status: AC
  Administered 2014-11-02: 25 mg via ORAL

## 2014-11-02 MED ORDER — ACETAMINOPHEN 325 MG PO TABS
650.0000 mg | ORAL_TABLET | Freq: Once | ORAL | Status: AC
  Administered 2014-11-02: 650 mg via ORAL

## 2014-11-02 MED ORDER — ACETAMINOPHEN 325 MG PO TABS
ORAL_TABLET | ORAL | Status: AC
  Filled 2014-11-02: qty 2

## 2014-11-02 MED ORDER — SODIUM CHLORIDE 0.9 % IV SOLN
250.0000 mL | Freq: Once | INTRAVENOUS | Status: AC
  Administered 2014-11-02: 250 mL via INTRAVENOUS

## 2014-11-02 MED ORDER — FUROSEMIDE 10 MG/ML IJ SOLN
INTRAMUSCULAR | Status: AC
  Filled 2014-11-02: qty 4

## 2014-11-02 MED ORDER — DIPHENHYDRAMINE HCL 25 MG PO CAPS
ORAL_CAPSULE | ORAL | Status: AC
  Filled 2014-11-02: qty 1

## 2014-11-02 NOTE — Patient Instructions (Signed)

## 2014-11-03 ENCOUNTER — Telehealth: Payer: Self-pay | Admitting: Physician Assistant

## 2014-11-03 LAB — TYPE AND SCREEN
ABO/RH(D): A POS
Antibody Screen: NEGATIVE
UNIT DIVISION: 0
UNIT DIVISION: 0

## 2014-11-03 NOTE — Telephone Encounter (Signed)
New problem:    RN Deb. with  [facility]. Needs a call back to confirm the lab work pt needs and the time frame.  The uncology office did a BNP.   But PA weaver needs a BMP done repeate  from 12/15.    Please give her a call [phone] STATION 1 ask for Deb. RN

## 2014-11-03 NOTE — Telephone Encounter (Signed)
Spoke with Ralph [name] at [facility]. Bmet was not drawn yesterday, and she inquired if it should be done today. Advised her it wouldn't be resulted till Monday and nobody could followup on it. She will draw B Met on Monday 11/06/2014 and call report.

## 2014-11-05 ENCOUNTER — Encounter: Payer: Self-pay | Admitting: Physician Assistant

## 2014-11-06 ENCOUNTER — Ambulatory Visit (HOSPITAL_BASED_OUTPATIENT_CLINIC_OR_DEPARTMENT_OTHER)
Admission: RE | Admit: 2014-11-06 | Discharge: 2014-11-06 | Disposition: A | Discharge status: Home / Self Care | Payer: Medicaid Other | Source: Ambulatory Visit | Attending: Family | Admitting: Family

## 2014-11-06 ENCOUNTER — Encounter: Payer: Self-pay | Admitting: Hematology & Oncology

## 2014-11-06 ENCOUNTER — Ambulatory Visit (HOSPITAL_BASED_OUTPATIENT_CLINIC_OR_DEPARTMENT_OTHER): Payer: Self-pay

## 2014-11-06 ENCOUNTER — Telehealth: Payer: Self-pay | Admitting: Physician Assistant

## 2014-11-06 ENCOUNTER — Other Ambulatory Visit: Payer: Self-pay | Admitting: *Deleted

## 2014-11-06 ENCOUNTER — Ambulatory Visit (HOSPITAL_BASED_OUTPATIENT_CLINIC_OR_DEPARTMENT_OTHER): Payer: Self-pay | Admitting: Family

## 2014-11-06 ENCOUNTER — Other Ambulatory Visit: Payer: Self-pay | Admitting: Oncology

## 2014-11-06 ENCOUNTER — Other Ambulatory Visit: Payer: Self-pay | Admitting: Family

## 2014-11-06 ENCOUNTER — Ambulatory Visit (HOSPITAL_BASED_OUTPATIENT_CLINIC_OR_DEPARTMENT_OTHER): Payer: Self-pay | Admitting: Lab

## 2014-11-06 VITALS — BP 187/105 | HR 75 | Temp 97.3°F | Resp 20

## 2014-11-06 DIAGNOSIS — C7800 Secondary malignant neoplasm of unspecified lung: Secondary | ICD-10-CM

## 2014-11-06 DIAGNOSIS — Z85118 Personal history of other malignant neoplasm of bronchus and lung: Secondary | ICD-10-CM | POA: Insufficient documentation

## 2014-11-06 DIAGNOSIS — D649 Anemia, unspecified: Secondary | ICD-10-CM

## 2014-11-06 DIAGNOSIS — C3490 Malignant neoplasm of unspecified part of unspecified bronchus or lung: Secondary | ICD-10-CM

## 2014-11-06 DIAGNOSIS — C3402 Malignant neoplasm of left main bronchus: Secondary | ICD-10-CM

## 2014-11-06 DIAGNOSIS — R0602 Shortness of breath: Secondary | ICD-10-CM

## 2014-11-06 DIAGNOSIS — I824Z2 Acute embolism and thrombosis of unspecified deep veins of left distal lower extremity: Secondary | ICD-10-CM | POA: Diagnosis not present

## 2014-11-06 DIAGNOSIS — F17200 Nicotine dependence, unspecified, uncomplicated: Secondary | ICD-10-CM | POA: Insufficient documentation

## 2014-11-06 DIAGNOSIS — C7801 Secondary malignant neoplasm of right lung: Secondary | ICD-10-CM

## 2014-11-06 DIAGNOSIS — M7989 Other specified soft tissue disorders: Secondary | ICD-10-CM | POA: Insufficient documentation

## 2014-11-06 LAB — CBC WITH DIFFERENTIAL (CANCER CENTER ONLY)
BASO#: 0 10*3/uL (ref 0.0–0.2)
BASO%: 0.2 % (ref 0.0–2.0)
EOS%: 0 % (ref 0.0–7.0)
Eosinophils Absolute: 0 10*3/uL (ref 0.0–0.5)
HCT: 33.1 % — ABNORMAL LOW (ref 38.7–49.9)
HGB: 10.8 g/dL — ABNORMAL LOW (ref 13.0–17.1)
LYMPH#: 0.8 10*3/uL — AB (ref 0.9–3.3)
LYMPH%: 4.6 % — AB (ref 14.0–48.0)
MCH: 29.4 pg (ref 28.0–33.4)
MCHC: 32.6 g/dL (ref 32.0–35.9)
MCV: 90 fL (ref 82–98)
MONO#: 0.6 10*3/uL (ref 0.1–0.9)
MONO%: 3.3 % (ref 0.0–13.0)
NEUT%: 91.9 % — ABNORMAL HIGH (ref 40.0–80.0)
NEUTROS ABS: 15.4 10*3/uL — AB (ref 1.5–6.5)
PLATELETS: 267 10*3/uL (ref 145–400)
RBC: 3.67 10*6/uL — ABNORMAL LOW (ref 4.20–5.70)
RDW: 22.8 % — AB (ref 11.1–15.7)
WBC: 16.8 10*3/uL — AB (ref 4.0–10.0)

## 2014-11-06 LAB — CMP (CANCER CENTER ONLY)
ALBUMIN: 1.9 g/dL — AB (ref 3.3–5.5)
ALT(SGPT): 14 U/L (ref 10–47)
AST: 23 U/L (ref 11–38)
Alkaline Phosphatase: 71 U/L (ref 26–84)
BILIRUBIN TOTAL: 0.4 mg/dL (ref 0.20–1.60)
BUN, Bld: 46 mg/dL — ABNORMAL HIGH (ref 7–22)
CO2: 30 mEq/L (ref 18–33)
Calcium: 8.6 mg/dL (ref 8.0–10.3)
Chloride: 105 mEq/L (ref 98–108)
Creat: 1.6 mg/dl — ABNORMAL HIGH (ref 0.6–1.2)
GLUCOSE: 143 mg/dL — AB (ref 73–118)
POTASSIUM: 4.2 meq/L (ref 3.3–4.7)
Sodium: 148 mEq/L — ABNORMAL HIGH (ref 128–145)
Total Protein: 5.4 g/dL — ABNORMAL LOW (ref 6.4–8.1)

## 2014-11-06 MED ORDER — SODIUM CHLORIDE 0.9 % IJ SOLN
10.0000 mL | INTRAMUSCULAR | Status: DC | PRN
  Administered 2014-11-06: 10 mL via INTRAVENOUS
  Filled 2014-11-06: qty 10

## 2014-11-06 MED ORDER — SODIUM CHLORIDE 0.9 % IV SOLN
Freq: Once | INTRAVENOUS | Status: AC
  Administered 2014-11-06: 13:00:00 via INTRAVENOUS

## 2014-11-06 MED ORDER — SODIUM CHLORIDE 0.9 % IV SOLN: Freq: Once | INTRAVENOUS | Status: DC

## 2014-11-06 MED ORDER — RIVAROXABAN (XARELTO) VTE STARTER PACK (15 & 20 MG): ORAL_TABLET | ORAL | Status: DC

## 2014-11-06 MED ORDER — PROCHLORPERAZINE EDISYLATE 5 MG/ML IJ SOLN
10.0000 mg | Freq: Once | INTRAMUSCULAR | Status: DC
  Administered 2014-11-06: 10 mg via INTRAVENOUS

## 2014-11-06 MED ORDER — HEPARIN SOD (PORK) LOCK FLUSH 100 UNIT/ML IV SOLN
500.0000 [IU] | Freq: Once | INTRAVENOUS | Status: AC
  Administered 2014-11-06: 500 [IU] via INTRAVENOUS
  Filled 2014-11-06: qty 5

## 2014-11-06 NOTE — Patient Instructions (Signed)

## 2014-11-06 NOTE — Telephone Encounter (Signed)
I returned Ralph [name]'s call in regards to pt having nasuea and some vomiting. Ralph [name] said he was told to call us before pt came for his appt today. I states pt did not have appt today w/cardiology but with the cancer clinic. Ralph [name] said gave zofran today.

## 2014-11-06 NOTE — Progress Notes (Signed)
[place]  [phone] Fax:([phone]) [phone]  ID: Ralph [name] OB: 02-05-1951 MR#: [id number] OQH#:[id number] Patient Care Team: No Pcp Per Patient as PCP - General (General Practice)  DIAGNOSIS: Metastatic lung cancer  INTERVAL HISTORY: Ralph [name] is here today for a follow-up and treatment. He has been vomiting since yesterday. He is feeling a little tired. We will give him Compazine and fluids today and hold off on treating him.  He is wearing  2L O2 as needed and has some SOB with exertion. He denies, fever, chills, cough, rash, headache, dizziness, chest pain, palpitations, abdominal pain, constipation, diarrhea, blood in urine or stool. No pain or bleeding.  His bottom has healed nicely from where he had C-diff and it irritated his skin. He is keeping the area clean and dry and it is no longer painful.   No tenderness, numbness or tingling in his extremities. He has some chronic swelling in his ankles and takes Lasix daily to help with this.  His has been able to eat and drink fluids. His weight is stable.  CURRENT TREATMENT: Alimta/Carboplatin and Avastin q 21 days  REVIEW OF SYSTEMS: All other 10 point review of systems is negative.   PAST MEDICAL HISTORY: Past Medical History  Diagnosis Date  . Allergy   . Anxiety     followed Baker/Psychiatry every six months.  . Hypertension   . Diabetes mellitus without complication   . Metastatic lung carcinoma 09/16/2014    PAST SURGICAL HISTORY: Past Surgical History  Procedure Laterality Date  . Rotator cuff surgery      Right  . Lymph node biopsy Right 09/12/2014    Procedure: RIGHT NECK LYMPH NODE BIOPSY;  Surgeon: Jackolyn Confer, MD;  Location: [facility];  Service: General;  Laterality: Right;    FAMILY HISTORY Family History  Problem Relation Age of Onset  . [name] disease Mother     AMI  . Cancer Sister     breast  . [name] disease Brother     AMI  . Cancer Brother     GYNECOLOGIC HISTORY:   No LMP for male patient.   SOCIAL HISTORY: History   Social History  . Marital Status: Divorced    Spouse Name: N/A    Number of Children: N/A  . Years of Education: N/A   Occupational History  . Not on file.   Social History Main Topics  . Smoking status: Current Every Day Smoker -- 0.75 packs/day for 19 years    Types: Cigarettes    Start date: 03/01/1995  . Smokeless tobacco: Never Used     Comment: 10-2014  still smoking  . Alcohol Use: No  . Drug Use: No  . Sexual Activity: Not on file   Other Topics Concern  . Not on file   Social History Narrative   Marital status: divorced; dating casually      Children: 2 grandchildren      Employment: truck driver x 15 years      Tobacco: 1 ppd      Alcohol:  Weekends      Drugs: none    ADVANCED DIRECTIVES:  <no information>  HEALTH MAINTENANCE: History  Substance Use Topics  . Smoking status: Current Every Day Smoker -- 0.75 packs/day for 19 years    Types: Cigarettes    Start date: 03/01/1995  . Smokeless tobacco: Never Used     Comment: 10-2014  still smoking  . Alcohol Use: No   Colonoscopy: PAP: Bone  density: Lipid panel:  No Known Allergies  Current Outpatient Prescriptions  Medication Sig Dispense Refill  . acetaminophen (TYLENOL) 325 MG tablet Take 2 tablets (650 mg total) by mouth every 6 (six) hours as needed for mild pain (or Fever >/= 101).    [name] acidophilus (RISAQUAD) CAPS capsule Take 1 capsule by mouth daily.    . carvedilol (COREG) 6.25 MG tablet Take 1.5 tablets (9.375 mg total) by mouth 2 (two) times daily with a meal. 90 tablet 6  . clonazePAM (KLONOPIN) 0.5 MG tablet Take 1 tablet (0.5 mg total) by mouth 3 (three) times daily as needed for anxiety. 30 tablet 0  . dexamethasone (DECADRON) 4 MG tablet Take 1 tablet (4 mg total) by mouth daily.    [name] escitalopram (LEXAPRO) 10 MG tablet Take 10 mg by mouth daily.    . folic acid (FOLVITE) 1 MG tablet Take 1 tablet (1 mg total) by mouth daily.     . furosemide (LASIX) 40 MG tablet Take 40 mg by mouth daily.    . hydrALAZINE (APRESOLINE) 25 MG tablet Take 25 mg by mouth 2 (two) times daily.    . insulin aspart (NOVOLOG) 100 UNIT/ML injection 0-15 Units, Subcutaneous, 3 times daily with meals,  CBG 70 - 120: 0 units CBG 121 - 150: 2 units CBG 151 - 200: 3 units CBG 201 - 250: 5 units CBG 251 - 300: 8 units and call MD 10 mL 11  . insulin glargine (LANTUS) 100 UNIT/ML injection Inject 10 Units into the skin at bedtime.    . isosorbide dinitrate (ISORDIL) 20 MG tablet Take 20 mg by mouth 3 (three) times daily.    [name] levalbuterol (XOPENEX) 0.63 MG/3ML nebulizer solution Take 3 mLs (0.63 mg total) by nebulization every 4 (four) hours as needed for wheezing or shortness of breath. (Patient not taking: Reported on 10/30/2014) 3 mL 12  . lidocaine-prilocaine (EMLA) cream Apply 1 application topically as needed. 30 g 0  . loperamide (IMODIUM) 2 MG capsule Take 1 capsule (2 mg total) by mouth every 4 (four) hours as needed for diarrhea or loose stools. 30 capsule 0  . nicotine (NICODERM CQ - DOSED IN MG/24 HOURS) 21 mg/24hr patch Place 1 patch (21 mg total) onto the skin daily. 28 patch 0  . omeprazole (PRILOSEC) 20 MG capsule Take 20 mg by mouth daily.     No current facility-administered medications for this visit.    OBJECTIVE: There were no vitals filed for this visit.  There were no vitals filed for this visit. ECOG FS:2 - Symptomatic, <50% confined to bed Ocular: Sclerae unicteric, pupils equal, round and reactive to light Ear-nose-throat: Oropharynx clear, dentition fair Lymphatic: No cervical or supraclavicular adenopathy Lungs no rales or rhonchi, good excursion bilaterally [name] regular rate and rhythm, no murmur appreciated Abd soft, nontender, positive bowel sounds MSK no focal spinal tenderness, no joint edema Neuro: non-focal, well-oriented, appropriate affect  LAB RESULTS: CMP     Component Value Date/Time   NA 148*  11/06/2014 1224   NA 135 10/26/2014 1629   K 4.2 11/06/2014 1224   K 4.7 10/26/2014 1629   CL 105 11/06/2014 1224   CL 103 10/26/2014 1629   CO2 30 11/06/2014 1224   CO2 26 10/26/2014 1629   GLUCOSE 143* 11/06/2014 1224   GLUCOSE 330* 10/26/2014 1629   BUN 46* 11/06/2014 1224   BUN 40* 10/26/2014 1629   CREATININE 1.6* 11/06/2014 1224   CREATININE 1.8* 10/26/2014 1629  CALCIUM 8.6 11/06/2014 1224   CALCIUM 7.2* 10/26/2014 1629   PROT 5.4* 11/06/2014 1224   PROT 4.9* 10/04/2014 1507   ALBUMIN 2.3* 10/04/2014 1507   AST 23 11/06/2014 1224   AST 15 10/04/2014 1507   ALT 14 11/06/2014 1224   ALT 9 10/04/2014 1507   ALKPHOS 71 11/06/2014 1224   ALKPHOS 59 10/04/2014 1507   BILITOT 0.40 11/06/2014 1224   BILITOT 0.2 10/04/2014 1507   GFRNONAA 40* 09/21/2014 0418   GFRAA 46* 09/21/2014 0418   INo results found for: SPEP, UPEP Lab Results  Component Value Date   WBC 16.8* 11/06/2014   NEUTROABS 15.4* 11/06/2014   HGB 10.8* 11/06/2014   HCT 33.1* 11/06/2014   MCV 90 11/06/2014   PLT 267 11/06/2014   No results found for: LABCA2 No components found for: [id number] No results for input(s): INR in the last 168 hours.  STUDIES:  ASSESSMENT/PLAN: Ralph [name] is a very pleasant 63 yo African American male recently diagnosed with metastatic adenocarcinoma of the lung. He has been vomiting off and on since yesterday morning.  His WBC 16.8 and neutrophils 15.4. He has not been given Neulasta but is on Decadron daily.  We will hold off on treatment again this week and just give him fluids and Compazine. He is feeling better.  He has his appointment and treatment schedule. We will resume treating him with his appointment in January. Hopefully this break will give him time to recuperate.   He knows to call here with any questions or concerns and to go to the ED in the event of an emergency. We can certainly see him sooner if need be.   Eliezer Bottom, NP 11/06/2014 12:55 PM

## 2014-11-06 NOTE — Telephone Encounter (Signed)
New Msg    Zach from [facility] calling states pt is having nausea and vomiting and he wants to let someone know.    Thedore Mins may be reached at [phone] station 1.

## 2014-11-06 NOTE — Progress Notes (Signed)
Pt seen by [facility], to have doppler today.  1500 Transported to Radiology via [facility] by Tana Coast.

## 2014-11-07 NOTE — Progress Notes (Signed)
Mr. Steinberger left leg was significantly more swollen than his right leg yesterday. [place] confirmed that he does have a thrombus in the peroneal vein. We started him on a Xarelto dose back. We will see him back in January for follow-up and treatment.

## 2014-11-20 ENCOUNTER — Other Ambulatory Visit: Payer: Self-pay | Admitting: Lab

## 2014-11-20 ENCOUNTER — Ambulatory Visit: Payer: Self-pay | Admitting: Hematology & Oncology

## 2014-11-20 ENCOUNTER — Ambulatory Visit: Payer: Self-pay

## 2014-11-23 ENCOUNTER — Encounter: Payer: Self-pay | Admitting: Physician Assistant

## 2014-11-23 ENCOUNTER — Ambulatory Visit (INDEPENDENT_AMBULATORY_CARE_PROVIDER_SITE_OTHER): Payer: Self-pay | Admitting: Physician Assistant

## 2014-11-23 VITALS — BP 122/78 | HR 72 | Ht 72.0 in | Wt 209.0 lb

## 2014-11-23 DIAGNOSIS — I5023 Acute on chronic systolic (congestive) heart failure: Secondary | ICD-10-CM

## 2014-11-23 DIAGNOSIS — J189 Pneumonia, unspecified organism: Secondary | ICD-10-CM

## 2014-11-23 DIAGNOSIS — R0602 Shortness of breath: Secondary | ICD-10-CM

## 2014-11-23 DIAGNOSIS — N189 Chronic kidney disease, unspecified: Secondary | ICD-10-CM

## 2014-11-23 DIAGNOSIS — C799 Secondary malignant neoplasm of unspecified site: Secondary | ICD-10-CM

## 2014-11-23 DIAGNOSIS — I429 Cardiomyopathy, unspecified: Secondary | ICD-10-CM

## 2014-11-23 DIAGNOSIS — I82402 Acute embolism and thrombosis of unspecified deep veins of left lower extremity: Secondary | ICD-10-CM

## 2014-11-23 DIAGNOSIS — C3490 Malignant neoplasm of unspecified part of unspecified bronchus or lung: Secondary | ICD-10-CM

## 2014-11-23 DIAGNOSIS — I10 Essential (primary) hypertension: Secondary | ICD-10-CM

## 2014-11-23 MED ORDER — TORSEMIDE 20 MG PO TABS: 20.0000 mg | ORAL_TABLET | Freq: Two times a day (BID) | ORAL | Status: DC

## 2014-11-23 NOTE — Progress Notes (Signed)
Cardiology Office Note   Date:  11/23/2014   ID:  Ralph [name], DOB [birth year], MRN [id number]  PCP:  No PCP Per Patient  Cardiologist:  Dr. [name]     History of Present Illness: Ralph [name] is a 64 y.o. male with a hx of DM2, HTN, CKD, tobacco abuse.  He was admitted in October 2015 with acute/systolic HF with EF 44-01%.  He apparently had a cardiac cath in [facility] in April 2015 that was reportedly normal. The only records we could obtain was a dobutamine echo from May 2015 that showed reduced EF of 45-50% and no ischemic changes. He was diuresed and HF medications were titrated. ACEI/ARB was avoided due to CKD. He had worsening Creatinine with diuresis (in setting of recent contrast load) and Lasix had to be held.   He was ultimatlely dx with Metastatic Lung Adenocarcinoma.  During hospital stay, he was noted to have abnormal ABI with R 0.58 and L 1.14 and evidence of R FA occlusion.    I saw him last month in FU.   He was stable.  I adjusted his beta blocker.    He was seen in follow-up by oncology in December. He was anemic with a Hgb in the 6 range and was transfused with PRBCs x 2.  He then was noted to have asymmetric edema with the left leg being greater than the right. Ultrasound confirmed DVT and he was placed on Xarelto.  He also tells me that his R arm started swelling and he apparently had an [place] that demonstrated a DVT in his R arm as well (done at his rehab facility - [facility] >> I do not have records).    He returns for FU.  There have been several medication changes since he was last here. He is no longer on carvedilol or Isordil for unclear reasons. He was previously on Lasix 40 mg a day. He is now on Demadex 10 mg a day. He is also on vancomycin. He tells me he was told he was being treated for pneumonia. He notes NYHA 3 dyspnea. He denies chest pain. He does note increased abdominal girth. He notes orthopnea, PND and increased LE edema. Left leg  is greater than the right. He denies syncope. He has had a cough with yellowish sputum. He denies hemoptysis.  Studies:   - Echo (10/15):  EF 20-25%, diff HK, Gr 1 DD, mod LAE, small eff, L pleural eff   Recent Labs: 09/11/2014: TSH 3.620 09/12/2014: Pro B Natriuretic peptide (BNP) 8587.0* 11/06/2014: ALT 14; BUN 46*; Creatinine 1.6*; Hemoglobin 10.8*; Potassium 4.2; Sodium 148*    Recent Radiology:   US Venous Img Lower Unilateral Left 11/06/2014   IMPRESSION: 1. Thrombus in the peroneal vein in the calf, no extension to the tibial peroneal trunk or popliteal vein. 2. Remaining left lower extremity veins are patent. 3. Popliteal fossa fluid collection measures of 4.6 cm most consistent with Baker cyst. These results will be called to the ordering clinician or representative by the Radiologist Assistant, and communication documented in the PACS or zVision Dashboard.   Electronically Signed   By: Jeb Levering M.D.   On: 11/06/2014 16:07     Wt Readings from Last 3 Encounters:  11/23/14 209 lb (94.802 kg)  10/30/14 190 lb (86.183 kg)  10/26/14 185 lb (83.915 kg)     Past Medical History  Diagnosis Date  . Allergy   . Anxiety  followed [facility]/Psychiatry every six months.  . Hypertension   . Diabetes mellitus without complication   . Metastatic lung carcinoma 09/16/2014    Current Outpatient Prescriptions  Medication Sig Dispense Refill  . acidophilus (RISAQUAD) CAPS capsule Take 1 capsule by mouth daily.    [name] torsemide (DEMADEX) 10 MG tablet Take 10 mg by mouth daily.    [name] acetaminophen (TYLENOL) 325 MG tablet Take 2 tablets (650 mg total) by mouth every 6 (six) hours as needed for mild pain (or Fever >/= 101). (Patient not taking: Reported on 11/23/2014)    . clonazePAM (KLONOPIN) 0.5 MG tablet Take 1 tablet (0.5 mg total) by mouth 3 (three) times daily as needed for anxiety. (Patient not taking: Reported on 11/23/2014) 30 tablet 0  . dexamethasone (DECADRON) 4 MG tablet Take  1 tablet (4 mg total) by mouth daily. (Patient not taking: Reported on 11/23/2014)    . escitalopram (LEXAPRO) 10 MG tablet Take 10 mg by mouth daily.    . folic acid (FOLVITE) 1 MG tablet Take 1 tablet (1 mg total) by mouth daily. (Patient not taking: Reported on 11/23/2014)    . hydrALAZINE (APRESOLINE) 25 MG tablet Take 25 mg by mouth 2 (two) times daily.    . insulin aspart (NOVOLOG) 100 UNIT/ML injection 0-15 Units, Subcutaneous, 3 times daily with meals,  CBG 70 - 120: 0 units CBG 121 - 150: 2 units CBG 151 - 200: 3 units CBG 201 - 250: 5 units CBG 251 - 300: 8 units and call MD 10 mL 11  . insulin glargine (LANTUS) 100 UNIT/ML injection Inject 10 Units into the skin at bedtime.    . isosorbide dinitrate (ISORDIL) 20 MG tablet Take 20 mg by mouth 3 (three) times daily.    [name] levalbuterol (XOPENEX) 0.63 MG/3ML nebulizer solution Take 3 mLs (0.63 mg total) by nebulization every 4 (four) hours as needed for wheezing or shortness of breath. (Patient not taking: Reported on 10/30/2014) 3 mL 12  . lidocaine-prilocaine (EMLA) cream Apply 1 application topically as needed. (Patient not taking: Reported on 11/23/2014) 30 g 0  . loperamide (IMODIUM) 2 MG capsule Take 1 capsule (2 mg total) by mouth every 4 (four) hours as needed for diarrhea or loose stools. (Patient not taking: Reported on 11/23/2014) 30 capsule 0  . omeprazole (PRILOSEC) 20 MG capsule Take 20 mg by mouth daily.    . Rivaroxaban (XARELTO STARTER PACK) 15 & 20 MG TBPK Take as directed on package: Start with one 15mg  tablet by mouth twice a day with food. On Day 22, switch to one 20mg  tablet once a day with food. 51 each 0   No current facility-administered medications for this visit.     Allergies:   Review of patient's allergies indicates no known allergies.   Social History:  The patient  reports that he has been smoking Cigarettes.  He started smoking about 19 years ago. He has a 14.25 pack-year smoking history. He has never used  smokeless tobacco. He reports that he does not drink alcohol or use illicit drugs.   Family History:  The patient's family history includes Cancer in his brother and sister; Heart attack in his brother and mother; Heart disease in his brother and mother. There is no history of Stroke.    ROS:  Please see the history of present illness.    All other systems reviewed and negative.    PHYSICAL EXAM: VS:  BP 122/78 mmHg  Pulse 72  Ht 6' (  1.829 m)  Wt 209 lb (94.802 kg)  BMI 28.34 kg/m2 Well nourished, well developed, in no acute distressOn 02 HEENT: normal Neck: no JVD at 90 degrees Cardiac:  normal S1, S2;  RRR; no murmur   Lungs:   Rhonchorous breath sounds bilaterally  Abd: Distended Ext: 1-2+ bilateral LE edemaL >R Skin: warm and dry Neuro:  CNs 2-12 intact, no focal abnormalities noted   ASSESSMENT AND PLAN:  1.  Acute on Chronic Systolic CHF:  He has evidence of volume excess. Volume overload is likely related to a combination of his systolic heart failure as well as significant protein calorie malnutrition in the setting of metastatic carcinoma. His Lasix has been changed to Demadex. The dose of Demadex he is now taking is actually a smaller dose of diuretic than he was previously taking with Lasix.  I would like to adjust his diuretics and diurese him as an outpatient and avoid hospitalization if at all possible.    -  Increase Demadex to 20 mg twice a day    -  Check BMET, BNP today    -  Repeat BMET, BNP 11/22/2014    -  Early follow-up next week 2.  Cardiomyopathy:  He is not a candidate for aggressive evaluation or intervention given malignancy.  I will contact his nursing facility to find out why his carvedilol and Isordil were stopped. Once his volume improves, we can consider adding these medications back if they are not contraindicated. 3.  Hypertension:  Controlled.   4.  Metastatic Lung CA:  FU with oncology as planned.  6.  Diabetes Mellitus:  FU with PCP.   7. Left  Leg DVT:  Continue Xarelto.  As he has a malignancy and this DVT is largely un-provoked, I suspect he will need life-long anticoagulation.  I am not certain about whether or not he truly has a DVT in his arm as well. 8.  HCAP:  It appears that he is on vancomycin. He has follow-up with oncology early next week. We will also arrange early follow-up in our clinic next week.  9.  CKD: Keep a close eye on renal function and potassium with adjustments in diuretics.  Disposition:   FU with Dr. Tamala Julian 1 week.     Signed, Versie Starks, MHS 11/23/2014 3:18 PM    [facility] [name] [place] [address] [place], [place], [place]  [address] Phone: [phone]; Fax: [phone]

## 2014-11-23 NOTE — Patient Instructions (Signed)
INCREASE DEMADEX TO 20 MG 1 TABLET TWICE DAILY  LAB WORK TODAY; BMET, BNP  YOU WILL NEED TO HAVE REPEAT LABS 12/08/2014 WHEN YOU SEE DR. ENNEVER; BMET, BNP  Your physician recommends that you schedule a follow-up appointment in: [place] OR NP SAME DAY DR. Tamala Julian IS IN THE OFFICE

## 2014-11-24 ENCOUNTER — Telehealth: Payer: Self-pay | Admitting: *Deleted

## 2014-11-24 DIAGNOSIS — R0602 Shortness of breath: Secondary | ICD-10-CM

## 2014-11-24 LAB — BASIC METABOLIC PANEL
BUN: 64 mg/dL — ABNORMAL HIGH (ref 6–23)
CALCIUM: 8.3 mg/dL — AB (ref 8.4–10.5)
CHLORIDE: 102 meq/L (ref 96–112)
CO2: 28 mEq/L (ref 19–32)
Creatinine, Ser: 2.8 mg/dL — ABNORMAL HIGH (ref 0.4–1.5)
GFR: 29.49 mL/min — ABNORMAL LOW (ref >60.00)
Glucose, Bld: 71 mg/dL (ref 70–99)
Potassium: 5.5 mEq/L — ABNORMAL HIGH (ref 3.5–5.1)
SODIUM: 140 meq/L (ref 135–145)

## 2014-11-24 LAB — BRAIN NATRIURETIC PEPTIDE: Pro B Natriuretic peptide (BNP): 409 pg/mL — ABNORMAL HIGH (ref 0.0–100.0)

## 2014-11-24 MED ORDER — LOPERAMIDE HCL 2 MG PO CAPS: 2.0000 mg | ORAL_CAPSULE | ORAL | Status: AC | PRN

## 2014-11-24 MED ORDER — CALCIUM CARBONATE 1500 (600 CA) MG PO TABS: 1500.0000 mg | ORAL_TABLET | Freq: Three times a day (TID) | ORAL | Status: AC

## 2014-11-24 MED ORDER — CARVEDILOL 6.25 MG PO TABS: 9.3750 mg | ORAL_TABLET | Freq: Two times a day (BID) | ORAL | Status: AC

## 2014-11-24 MED ORDER — FLUCONAZOLE 100 MG PO TABS: 100.0000 mg | ORAL_TABLET | Freq: Every day | ORAL | Status: AC

## 2014-11-24 MED ORDER — CLONAZEPAM 0.5 MG PO TABS: 0.5000 mg | ORAL_TABLET | Freq: Three times a day (TID) | ORAL | Status: AC | PRN

## 2014-11-24 MED ORDER — ACETAMINOPHEN 500 MG PO TABS: 500.0000 mg | ORAL_TABLET | Freq: Three times a day (TID) | ORAL | Status: DC

## 2014-11-24 MED ORDER — ESCITALOPRAM OXALATE 10 MG PO TABS: 10.0000 mg | ORAL_TABLET | Freq: Every day | ORAL | Status: AC

## 2014-11-24 MED ORDER — LEVALBUTEROL HCL 0.63 MG/3ML IN NEBU: 0.6300 mg | INHALATION_SOLUTION | RESPIRATORY_TRACT | Status: AC | PRN

## 2014-11-24 MED ORDER — FOLIC ACID 1 MG PO TABS: 1.0000 mg | ORAL_TABLET | Freq: Every day | ORAL | Status: AC

## 2014-11-24 MED ORDER — DEXAMETHASONE 4 MG PO TABS: 4.0000 mg | ORAL_TABLET | Freq: Every day | ORAL | Status: AC

## 2014-11-24 MED ORDER — INSULIN ASPART 100 UNIT/ML CARTRIDGE (PENFILL): 4.0000 [IU] | Freq: Every day | SUBCUTANEOUS | Status: AC

## 2014-11-24 MED ORDER — PROMETHAZINE HCL 25 MG PO TABS: 25.0000 mg | ORAL_TABLET | Freq: Four times a day (QID) | ORAL | Status: AC | PRN

## 2014-11-24 MED ORDER — OMEPRAZOLE 20 MG PO CPDR: 20.0000 mg | DELAYED_RELEASE_CAPSULE | Freq: Every day | ORAL | Status: AC

## 2014-11-24 MED ORDER — ONDANSETRON HCL 4 MG PO TABS: 4.0000 mg | ORAL_TABLET | Freq: Four times a day (QID) | ORAL | Status: AC | PRN

## 2014-11-24 MED ORDER — ISOSORBIDE DINITRATE 30 MG PO TABS: 30.0000 mg | ORAL_TABLET | Freq: Four times a day (QID) | ORAL | Status: AC

## 2014-11-24 MED ORDER — FUROSEMIDE 20 MG PO TABS: ORAL_TABLET | ORAL | Status: AC

## 2014-11-24 MED ORDER — TRAMADOL HCL 50 MG PO TABS: 50.0000 mg | ORAL_TABLET | Freq: Four times a day (QID) | ORAL | Status: AC | PRN

## 2014-11-24 NOTE — Telephone Encounter (Signed)
S/w Thedore Mins, RN at SNF in regards to pt's lab results from 11/23/14, with med changes to be made due to wrong [facility] sent to Richardson Dopp, [place] visit 11/23/14, per fax rcv'd today of correct MAR. I will make changes to med list to reflect correct medications. Will fax results with changes to [facility] to [place]. Thedore Mins states pt was started on Rocephin 1 gm daily x 7 as of last night. I made PA aware of this today.

## 2014-11-24 NOTE — Progress Notes (Signed)
Lab Results  Component Value Date   CREATININE 2.8* 11/23/2014   BUN 64* 11/23/2014   NA 140 11/23/2014   K 5.5* 11/23/2014   CL 102 11/23/2014   CO2 28 11/23/2014    11/23/2014: Pro B Natriuretic peptide (BNP) 409.0*   Lab abnormalities indicate worsening renal function likely related to cardiorenal syndrome. He also has a total protein of 5.4. He's had problems with significant anemia from his malignancy and treatment with chemotherapy. I reviewed his case today by telephone with Dr. Marin Olp.  The patient's prognosis with his metastatic lung cancer is quite poor. Given that, we will not pursue admission to the hospital with inotropic support and advanced heart failure team evaluation. Instead we will continue to try to pursue medical therapy as an outpatient.   We had incorrect information regarding his medications yesterday. He is currently taking Lasix 20 mg twice a day. I have recommended increasing his Lasix to 40 mg in the morning and 20 mg in the evening. Limit dietary K+.  Follow-up BMET will be obtained next week. Follow-up as planned.  Signed,  Richardson Dopp, PA-C   11/24/2014 2:03 PM

## 2014-11-27 ENCOUNTER — Other Ambulatory Visit (HOSPITAL_BASED_OUTPATIENT_CLINIC_OR_DEPARTMENT_OTHER): Payer: Self-pay | Admitting: Lab

## 2014-11-27 ENCOUNTER — Ambulatory Visit (HOSPITAL_BASED_OUTPATIENT_CLINIC_OR_DEPARTMENT_OTHER)
Admission: RE | Admit: 2014-11-27 | Discharge: 2014-11-27 | Disposition: A | Discharge status: Home / Self Care | Payer: Medicaid Other | Source: Ambulatory Visit | Attending: Hematology & Oncology | Admitting: Hematology & Oncology

## 2014-11-27 ENCOUNTER — Inpatient Hospital Stay (HOSPITAL_COMMUNITY): Payer: Medicaid Other

## 2014-11-27 ENCOUNTER — Encounter: Payer: Self-pay | Admitting: Hematology & Oncology

## 2014-11-27 ENCOUNTER — Other Ambulatory Visit: Payer: Self-pay | Admitting: Hematology & Oncology

## 2014-11-27 ENCOUNTER — Ambulatory Visit (INDEPENDENT_AMBULATORY_CARE_PROVIDER_SITE_OTHER)
Admission: RE | Admit: 2014-11-27 | Discharge: 2014-11-27 | Disposition: A | Discharge status: Home / Self Care | Payer: Medicaid Other | Source: Ambulatory Visit | Attending: Hematology & Oncology | Admitting: Hematology & Oncology

## 2014-11-27 ENCOUNTER — Encounter (HOSPITAL_COMMUNITY): Payer: Self-pay | Admitting: Internal Medicine

## 2014-11-27 ENCOUNTER — Ambulatory Visit: Payer: Self-pay

## 2014-11-27 ENCOUNTER — Telehealth: Payer: Self-pay | Admitting: Physician Assistant

## 2014-11-27 ENCOUNTER — Inpatient Hospital Stay (HOSPITAL_BASED_OUTPATIENT_CLINIC_OR_DEPARTMENT_OTHER)
Admission: AD | Admit: 2014-11-27 | Discharge: 2014-12-18 | DRG: 314 | Disposition: E | Payer: Medicaid Other | Source: Ambulatory Visit | Attending: Internal Medicine | Admitting: Internal Medicine

## 2014-11-27 ENCOUNTER — Ambulatory Visit (HOSPITAL_BASED_OUTPATIENT_CLINIC_OR_DEPARTMENT_OTHER): Payer: Self-pay | Admitting: Hematology & Oncology

## 2014-11-27 VITALS — BP 150/75 | HR 74 | Temp 97.6°F | Resp 20 | Ht 70.0 in

## 2014-11-27 DIAGNOSIS — E10621 Type 1 diabetes mellitus with foot ulcer: Secondary | ICD-10-CM | POA: Diagnosis present

## 2014-11-27 DIAGNOSIS — E1051 Type 1 diabetes mellitus with diabetic peripheral angiopathy without gangrene: Secondary | ICD-10-CM | POA: Diagnosis present

## 2014-11-27 DIAGNOSIS — N179 Acute kidney failure, unspecified: Secondary | ICD-10-CM | POA: Diagnosis present

## 2014-11-27 DIAGNOSIS — Z6826 Body mass index (BMI) 26.0-26.9, adult: Secondary | ICD-10-CM | POA: Diagnosis not present

## 2014-11-27 DIAGNOSIS — E10649 Type 1 diabetes mellitus with hypoglycemia without coma: Secondary | ICD-10-CM | POA: Diagnosis present

## 2014-11-27 DIAGNOSIS — A047 Enterocolitis due to Clostridium difficile: Secondary | ICD-10-CM | POA: Diagnosis present

## 2014-11-27 DIAGNOSIS — C7801 Secondary malignant neoplasm of right lung: Secondary | ICD-10-CM

## 2014-11-27 DIAGNOSIS — I10 Essential (primary) hypertension: Secondary | ICD-10-CM

## 2014-11-27 DIAGNOSIS — L03115 Cellulitis of right lower limb: Secondary | ICD-10-CM | POA: Diagnosis present

## 2014-11-27 DIAGNOSIS — J449 Chronic obstructive pulmonary disease, unspecified: Secondary | ICD-10-CM | POA: Diagnosis present

## 2014-11-27 DIAGNOSIS — Z95828 Presence of other vascular implants and grafts: Secondary | ICD-10-CM

## 2014-11-27 DIAGNOSIS — N289 Disorder of kidney and ureter, unspecified: Secondary | ICD-10-CM | POA: Diagnosis present

## 2014-11-27 DIAGNOSIS — Y848 Other medical procedures as the cause of abnormal reaction of the patient, or of later complication, without mention of misadventure at the time of the procedure: Secondary | ICD-10-CM | POA: Diagnosis present

## 2014-11-27 DIAGNOSIS — T80211A Bloodstream infection due to central venous catheter, initial encounter: Principal | ICD-10-CM | POA: Diagnosis present

## 2014-11-27 DIAGNOSIS — Z515 Encounter for palliative care: Secondary | ICD-10-CM

## 2014-11-27 DIAGNOSIS — I130 Hypertensive heart and chronic kidney disease with heart failure and stage 1 through stage 4 chronic kidney disease, or unspecified chronic kidney disease: Secondary | ICD-10-CM | POA: Diagnosis present

## 2014-11-27 DIAGNOSIS — R0602 Shortness of breath: Secondary | ICD-10-CM

## 2014-11-27 DIAGNOSIS — A4102 Sepsis due to Methicillin resistant Staphylococcus aureus: Secondary | ICD-10-CM | POA: Diagnosis present

## 2014-11-27 DIAGNOSIS — E11621 Type 2 diabetes mellitus with foot ulcer: Secondary | ICD-10-CM | POA: Diagnosis present

## 2014-11-27 DIAGNOSIS — F17210 Nicotine dependence, cigarettes, uncomplicated: Secondary | ICD-10-CM | POA: Diagnosis present

## 2014-11-27 DIAGNOSIS — E1052 Type 1 diabetes mellitus with diabetic peripheral angiopathy with gangrene: Secondary | ICD-10-CM | POA: Diagnosis present

## 2014-11-27 DIAGNOSIS — C3490 Malignant neoplasm of unspecified part of unspecified bronchus or lung: Secondary | ICD-10-CM | POA: Diagnosis present

## 2014-11-27 DIAGNOSIS — C770 Secondary and unspecified malignant neoplasm of lymph nodes of head, face and neck: Secondary | ICD-10-CM

## 2014-11-27 DIAGNOSIS — L97509 Non-pressure chronic ulcer of other part of unspecified foot with unspecified severity: Secondary | ICD-10-CM

## 2014-11-27 DIAGNOSIS — N189 Chronic kidney disease, unspecified: Secondary | ICD-10-CM | POA: Diagnosis present

## 2014-11-27 DIAGNOSIS — A0472 Enterocolitis due to Clostridium difficile, not specified as recurrent: Secondary | ICD-10-CM | POA: Clinically undetermined

## 2014-11-27 DIAGNOSIS — T380X5A Adverse effect of glucocorticoids and synthetic analogues, initial encounter: Secondary | ICD-10-CM | POA: Diagnosis present

## 2014-11-27 DIAGNOSIS — I5021 Acute systolic (congestive) heart failure: Secondary | ICD-10-CM | POA: Diagnosis present

## 2014-11-27 DIAGNOSIS — F32A Depression, unspecified: Secondary | ICD-10-CM | POA: Diagnosis present

## 2014-11-27 DIAGNOSIS — D899 Disorder involving the immune mechanism, unspecified: Secondary | ICD-10-CM | POA: Diagnosis present

## 2014-11-27 DIAGNOSIS — F329 Major depressive disorder, single episode, unspecified: Secondary | ICD-10-CM | POA: Diagnosis present

## 2014-11-27 DIAGNOSIS — N171 Acute kidney failure with acute cortical necrosis: Secondary | ICD-10-CM

## 2014-11-27 DIAGNOSIS — D696 Thrombocytopenia, unspecified: Secondary | ICD-10-CM | POA: Diagnosis present

## 2014-11-27 DIAGNOSIS — R7881 Bacteremia: Secondary | ICD-10-CM | POA: Diagnosis present

## 2014-11-27 DIAGNOSIS — F419 Anxiety disorder, unspecified: Secondary | ICD-10-CM | POA: Diagnosis present

## 2014-11-27 DIAGNOSIS — I825Y9 Chronic embolism and thrombosis of unspecified deep veins of unspecified proximal lower extremity: Secondary | ICD-10-CM | POA: Diagnosis present

## 2014-11-27 DIAGNOSIS — T80219A Unspecified infection due to central venous catheter, initial encounter: Secondary | ICD-10-CM

## 2014-11-27 DIAGNOSIS — Z79899 Other long term (current) drug therapy: Secondary | ICD-10-CM

## 2014-11-27 DIAGNOSIS — I469 Cardiac arrest, cause unspecified: Secondary | ICD-10-CM | POA: Diagnosis not present

## 2014-11-27 DIAGNOSIS — I82502 Chronic embolism and thrombosis of unspecified deep veins of left lower extremity: Secondary | ICD-10-CM | POA: Diagnosis present

## 2014-11-27 DIAGNOSIS — Z9889 Other specified postprocedural states: Secondary | ICD-10-CM

## 2014-11-27 DIAGNOSIS — C3402 Malignant neoplasm of left main bronchus: Secondary | ICD-10-CM

## 2014-11-27 DIAGNOSIS — I5022 Chronic systolic (congestive) heart failure: Secondary | ICD-10-CM | POA: Diagnosis present

## 2014-11-27 DIAGNOSIS — L089 Local infection of the skin and subcutaneous tissue, unspecified: Secondary | ICD-10-CM

## 2014-11-27 DIAGNOSIS — R52 Pain, unspecified: Secondary | ICD-10-CM

## 2014-11-27 DIAGNOSIS — J910 Malignant pleural effusion: Secondary | ICD-10-CM | POA: Diagnosis present

## 2014-11-27 DIAGNOSIS — I5023 Acute on chronic systolic (congestive) heart failure: Secondary | ICD-10-CM | POA: Diagnosis present

## 2014-11-27 DIAGNOSIS — B9562 Methicillin resistant Staphylococcus aureus infection as the cause of diseases classified elsewhere: Secondary | ICD-10-CM | POA: Diagnosis present

## 2014-11-27 DIAGNOSIS — C3432 Malignant neoplasm of lower lobe, left bronchus or lung: Secondary | ICD-10-CM

## 2014-11-27 DIAGNOSIS — C7800 Secondary malignant neoplasm of unspecified lung: Secondary | ICD-10-CM | POA: Diagnosis present

## 2014-11-27 DIAGNOSIS — E43 Unspecified severe protein-calorie malnutrition: Secondary | ICD-10-CM | POA: Diagnosis present

## 2014-11-27 DIAGNOSIS — Z794 Long term (current) use of insulin: Secondary | ICD-10-CM

## 2014-11-27 DIAGNOSIS — I82402 Acute embolism and thrombosis of unspecified deep veins of left lower extremity: Secondary | ICD-10-CM

## 2014-11-27 DIAGNOSIS — I313 Pericardial effusion (noninflammatory): Secondary | ICD-10-CM | POA: Diagnosis present

## 2014-11-27 DIAGNOSIS — D649 Anemia, unspecified: Secondary | ICD-10-CM | POA: Diagnosis present

## 2014-11-27 DIAGNOSIS — R531 Weakness: Secondary | ICD-10-CM | POA: Diagnosis present

## 2014-11-27 DIAGNOSIS — E11628 Type 2 diabetes mellitus with other skin complications: Secondary | ICD-10-CM

## 2014-11-27 LAB — CBC WITH DIFFERENTIAL/PLATELET
BASOS ABS: 0 10*3/uL (ref 0.0–0.1)
Basophils Relative: 0 % (ref 0–1)
EOS ABS: 0 10*3/uL (ref 0.0–0.7)
Eosinophils Relative: 0 % (ref 0–5)
HEMATOCRIT: 29.7 % — AB (ref 39.0–52.0)
Hemoglobin: 9.5 g/dL — ABNORMAL LOW (ref 13.0–17.0)
LYMPHS PCT: 1 % — AB (ref 12–46)
Lymphs Abs: 0.2 10*3/uL — ABNORMAL LOW (ref 0.7–4.0)
MCH: 28.6 pg (ref 26.0–34.0)
MCHC: 32 g/dL (ref 30.0–36.0)
MCV: 89.5 fL (ref 78.0–100.0)
MONO ABS: 0.6 10*3/uL (ref 0.1–1.0)
Monocytes Relative: 3 % (ref 3–12)
Neutro Abs: 19.6 10*3/uL — ABNORMAL HIGH (ref 1.7–7.7)
Neutrophils Relative %: 96 % — ABNORMAL HIGH (ref 43–77)
Platelets: 123 10*3/uL — ABNORMAL LOW (ref 150–400)
RBC: 3.32 MIL/uL — ABNORMAL LOW (ref 4.22–5.81)
RDW: 22 % — ABNORMAL HIGH (ref 11.5–15.5)
WBC: 20.4 10*3/uL — AB (ref 4.0–10.5)

## 2014-11-27 LAB — CBC WITH DIFFERENTIAL (CANCER CENTER ONLY)
BASO#: 0 10*3/uL (ref 0.0–0.2)
BASO%: 0.1 % (ref 0.0–2.0)
EOS%: 0.1 % (ref 0.0–7.0)
Eosinophils Absolute: 0 10*3/uL (ref 0.0–0.5)
HEMATOCRIT: 28.3 % — AB (ref 38.7–49.9)
HGB: 9.1 g/dL — ABNORMAL LOW (ref 13.0–17.1)
LYMPH#: 0.6 10*3/uL — ABNORMAL LOW (ref 0.9–3.3)
LYMPH%: 3.4 % — ABNORMAL LOW (ref 14.0–48.0)
MCH: 29 pg (ref 28.0–33.4)
MCHC: 32.2 g/dL (ref 32.0–35.9)
MCV: 90 fL (ref 82–98)
MONO#: 0.7 10*3/uL (ref 0.1–0.9)
MONO%: 4.6 % (ref 0.0–13.0)
NEUT#: 14.8 10*3/uL — ABNORMAL HIGH (ref 1.5–6.5)
NEUT%: 91.8 % — AB (ref 40.0–80.0)
Platelets: 130 10*3/uL — ABNORMAL LOW (ref 145–400)
RBC: 3.14 10*6/uL — ABNORMAL LOW (ref 4.20–5.70)
RDW: 22.1 % — ABNORMAL HIGH (ref 11.1–15.7)
WBC: 16.2 10*3/uL — ABNORMAL HIGH (ref 4.0–10.0)

## 2014-11-27 LAB — CREATININE, SERUM
Creatinine, Ser: 2.84 mg/dL — ABNORMAL HIGH (ref 0.50–1.35)
GFR calc Af Amer: 26 mL/min — ABNORMAL LOW (ref >90)
GFR calc non Af Amer: 22 mL/min — ABNORMAL LOW (ref >90)

## 2014-11-27 LAB — CMP (CANCER CENTER ONLY)
ALT: 12 U/L (ref 10–47)
AST: 29 U/L (ref 11–38)
Albumin: 1.4 g/dL — ABNORMAL LOW (ref 3.3–5.5)
Alkaline Phosphatase: 66 U/L (ref 26–84)
BUN, Bld: 62 mg/dL — ABNORMAL HIGH (ref 7–22)
CO2: 29 mEq/L (ref 18–33)
CREATININE: 2.9 mg/dL — AB (ref 0.6–1.2)
Calcium: 8.3 mg/dL (ref 8.0–10.3)
Chloride: 103 mEq/L (ref 98–108)
Glucose, Bld: 138 mg/dL — ABNORMAL HIGH (ref 73–118)
Potassium: 4.9 mEq/L — ABNORMAL HIGH (ref 3.3–4.7)
SODIUM: 140 meq/L (ref 128–145)
TOTAL PROTEIN: 5.7 g/dL — AB (ref 6.4–8.1)
Total Bilirubin: 0.4 mg/dl (ref 0.20–1.60)

## 2014-11-27 LAB — BRAIN NATRIURETIC PEPTIDE: B Natriuretic Peptide: 474.9 pg/mL — ABNORMAL HIGH (ref 0.0–100.0)

## 2014-11-27 LAB — GLUCOSE, CAPILLARY: Glucose-Capillary: 321 mg/dL — ABNORMAL HIGH (ref 70–99)

## 2014-11-27 LAB — TROPONIN I: TROPONIN I: 0.07 ng/mL — AB (ref <0.031)

## 2014-11-27 LAB — PROTIME-INR
INR: 3.16 — AB (ref 0.00–1.49)
PROTHROMBIN TIME: 32.7 s — AB (ref 11.6–15.2)

## 2014-11-27 LAB — UA PROTEIN, DIPSTICK - CHCC SATELLITE: Protein, Urine: 100 mg/dL

## 2014-11-27 MED ORDER — ISOSORBIDE DINITRATE 30 MG PO TABS
30.0000 mg | ORAL_TABLET | Freq: Four times a day (QID) | ORAL | Status: DC
  Administered 2014-11-28: 30 mg via ORAL
  Filled 2014-11-27 (×5): qty 1

## 2014-11-27 MED ORDER — ALBUTEROL SULFATE (2.5 MG/3ML) 0.083% IN NEBU
2.5000 mg | INHALATION_SOLUTION | Freq: Three times a day (TID) | RESPIRATORY_TRACT | Status: DC
  Administered 2014-11-28 – 2014-11-30 (×6): 2.5 mg via RESPIRATORY_TRACT
  Filled 2014-11-27 (×7): qty 3

## 2014-11-27 MED ORDER — HEPARIN SODIUM (PORCINE) 5000 UNIT/ML IJ SOLN
5000.0000 [IU] | Freq: Three times a day (TID) | INTRAMUSCULAR | Status: DC
Start: 2014-11-27 — End: 2014-11-27
  Filled 2014-11-27 (×2): qty 1

## 2014-11-27 MED ORDER — CLONAZEPAM 0.5 MG PO TABS
0.5000 mg | ORAL_TABLET | Freq: Three times a day (TID) | ORAL | Status: DC | PRN
  Administered 2014-11-28 – 2014-12-05 (×9): 0.5 mg via ORAL
  Filled 2014-11-27 (×9): qty 1

## 2014-11-27 MED ORDER — ONDANSETRON HCL 4 MG PO TABS: 4.0000 mg | ORAL_TABLET | Freq: Four times a day (QID) | ORAL | Status: DC | PRN

## 2014-11-27 MED ORDER — SODIUM CHLORIDE 0.9 % IJ SOLN
3.0000 mL | Freq: Two times a day (BID) | INTRAMUSCULAR | Status: DC
  Administered 2014-11-28 – 2014-12-04 (×3): 3 mL via INTRAVENOUS

## 2014-11-27 MED ORDER — ESCITALOPRAM OXALATE 10 MG PO TABS
10.0000 mg | ORAL_TABLET | Freq: Every day | ORAL | Status: DC
  Administered 2014-11-28 – 2014-12-06 (×9): 10 mg via ORAL
  Filled 2014-11-27 (×10): qty 1

## 2014-11-27 MED ORDER — POLYETHYLENE GLYCOL 3350 17 G PO PACK
17.0000 g | PACK | Freq: Every day | ORAL | Status: DC | PRN
Start: 2014-11-27 — End: 2014-12-07
  Filled 2014-11-27: qty 1

## 2014-11-27 MED ORDER — FLUCONAZOLE 100 MG PO TABS
100.0000 mg | ORAL_TABLET | Freq: Every day | ORAL | Status: DC
  Filled 2014-11-27: qty 1

## 2014-11-27 MED ORDER — FUROSEMIDE 10 MG/ML IJ SOLN
8.0000 mg/h | INTRAVENOUS | Status: DC
  Administered 2014-11-28: 4 mg/h via INTRAVENOUS
  Filled 2014-11-27 (×2): qty 25

## 2014-11-27 MED ORDER — SODIUM CHLORIDE 0.9 % IV SOLN: 250.0000 mL | INTRAVENOUS | Status: DC | PRN

## 2014-11-27 MED ORDER — ONDANSETRON HCL 4 MG/2ML IJ SOLN: 4.0000 mg | Freq: Four times a day (QID) | INTRAMUSCULAR | Status: DC | PRN

## 2014-11-27 MED ORDER — INSULIN DETEMIR 100 UNIT/ML FLEXPEN: 10.0000 [IU] | PEN_INJECTOR | Freq: Every day | SUBCUTANEOUS | Status: DC

## 2014-11-27 MED ORDER — PANTOPRAZOLE SODIUM 40 MG PO TBEC
40.0000 mg | DELAYED_RELEASE_TABLET | Freq: Every day | ORAL | Status: DC
  Administered 2014-11-28 – 2014-12-06 (×8): 40 mg via ORAL
  Filled 2014-11-27 (×10): qty 1

## 2014-11-27 MED ORDER — INSULIN GLARGINE 100 UNIT/ML ~~LOC~~ SOLN
10.0000 [IU] | Freq: Every day | SUBCUTANEOUS | Status: DC
  Administered 2014-11-27: 10 [IU] via SUBCUTANEOUS
  Filled 2014-11-27: qty 0.1

## 2014-11-27 MED ORDER — INSULIN ASPART 100 UNIT/ML ~~LOC~~ SOLN
3.0000 [IU] | Freq: Three times a day (TID) | SUBCUTANEOUS | Status: DC
  Administered 2014-11-28: 3 [IU] via SUBCUTANEOUS

## 2014-11-27 MED ORDER — PROMETHAZINE HCL 25 MG PO TABS: 25.0000 mg | ORAL_TABLET | Freq: Four times a day (QID) | ORAL | Status: DC | PRN

## 2014-11-27 MED ORDER — CALCIUM CARBONATE 1250 (500 CA) MG PO TABS
1250.0000 mg | ORAL_TABLET | Freq: Three times a day (TID) | ORAL | Status: DC
  Administered 2014-11-27 – 2014-12-06 (×25): 1250 mg via ORAL
  Filled 2014-11-27 (×31): qty 1

## 2014-11-27 MED ORDER — INSULIN ASPART 100 UNIT/ML ~~LOC~~ SOLN
0.0000 [IU] | Freq: Every day | SUBCUTANEOUS | Status: DC
Start: 2014-11-27 — End: 2014-12-07
  Administered 2014-11-27: 4 [IU] via SUBCUTANEOUS
  Administered 2014-12-04 – 2014-12-05 (×2): 2 [IU] via SUBCUTANEOUS

## 2014-11-27 MED ORDER — CARVEDILOL 6.25 MG PO TABS
9.3750 mg | ORAL_TABLET | Freq: Two times a day (BID) | ORAL | Status: DC
  Administered 2014-11-27 – 2014-12-06 (×19): 9.375 mg via ORAL
  Filled 2014-11-27 (×38): qty 1

## 2014-11-27 MED ORDER — SODIUM CHLORIDE 0.9 % IJ SOLN: 3.0000 mL | INTRAMUSCULAR | Status: DC | PRN

## 2014-11-27 MED ORDER — DEXAMETHASONE 4 MG PO TABS
4.0000 mg | ORAL_TABLET | Freq: Every day | ORAL | Status: DC
  Filled 2014-11-27: qty 1

## 2014-11-27 MED ORDER — DEXTROSE 5 % IV SOLN
1.0000 g | Freq: Two times a day (BID) | INTRAVENOUS | Status: DC
  Administered 2014-11-28 (×3): 1 g via INTRAVENOUS
  Filled 2014-11-27 (×5): qty 1

## 2014-11-27 MED ORDER — ALBUTEROL SULFATE (2.5 MG/3ML) 0.083% IN NEBU
2.5000 mg | INHALATION_SOLUTION | Freq: Four times a day (QID) | RESPIRATORY_TRACT | Status: DC
  Administered 2014-11-27: 2.5 mg via RESPIRATORY_TRACT

## 2014-11-27 MED ORDER — INSULIN ASPART 100 UNIT/ML ~~LOC~~ SOLN
0.0000 [IU] | Freq: Three times a day (TID) | SUBCUTANEOUS | Status: DC
Start: 2014-11-28 — End: 2014-12-07
  Administered 2014-11-28: 2 [IU] via SUBCUTANEOUS
  Administered 2014-11-28 – 2014-12-04 (×4): 1 [IU] via SUBCUTANEOUS
  Administered 2014-12-04: 2 [IU] via SUBCUTANEOUS
  Administered 2014-12-04: 1 [IU] via SUBCUTANEOUS
  Administered 2014-12-05 – 2014-12-06 (×4): 3 [IU] via SUBCUTANEOUS
  Administered 2014-12-06 (×2): 1 [IU] via SUBCUTANEOUS

## 2014-11-27 MED ORDER — ACETAMINOPHEN 500 MG PO TABS: 500.0000 mg | ORAL_TABLET | Freq: Three times a day (TID) | ORAL | Status: DC | PRN

## 2014-11-27 MED ORDER — RIVAROXABAN 20 MG PO TABS: 20.0000 mg | ORAL_TABLET | Freq: Every day | ORAL | Status: DC

## 2014-11-27 MED ORDER — RIVAROXABAN 15 MG PO TABS
15.0000 mg | ORAL_TABLET | Freq: Two times a day (BID) | ORAL | Status: DC
  Administered 2014-11-27 – 2014-11-28 (×2): 15 mg via ORAL
  Filled 2014-11-27 (×3): qty 1

## 2014-11-27 MED ORDER — FOLIC ACID 1 MG PO TABS
1.0000 mg | ORAL_TABLET | Freq: Every day | ORAL | Status: DC
  Filled 2014-11-27: qty 1

## 2014-11-27 NOTE — Progress Notes (Signed)
Dr. Jonette Eva requested admission for management of progressive failure to thrive. Pt apparently with end stage heart failure EF 20 - 25%, metastatic lung adenocarcinoma. Pt still full code but Dr. Jonette Eva will continue his discussion of [place]. Admission to medical bed requested.   Faye Ramsay, MD  [facility] Pager [phone] Cell [phone]  If 7PM-7AM, please contact night-coverage [email or web address] Password [email or web address]

## 2014-11-27 NOTE — H&P (Signed)
[facility] History and Physical  Ralph [name] VQX:[id number] DOB: 1951-02-27 DOA: 11/28/2014  Referring physician: Dr. Jonette Eva PCP: No PCP Per Patient   Chief Complaint: infected foot  HPI: Ralph [name] is a 64 y.o. male past medical history of diabetes mellitus type 2, anxiety and depression, systolic heart failure with an EF of 25% back in October 2015 was seen his cardiologist this month and increase his Lasix and his creatinine worsen, metastatic lung cancer on chemotherapy that comes in for generalized weakness. As per patient he relates he try to use the port about 2 weeks ago at the emergency room and it was not working and white substance came out. He denies any chest pain, some shortness of breath with exertion. He relates he can ambulate without being short of breath. His lower extremities have progressively gotten edematous. He was sought Dr. Lutricia Feil for further [facility] for further evaluation.   Review of Systems:  Constitutional:  No weight loss, night sweats, Fevers, chills, fatigue.  HEENT:  No headaches, Difficulty swallowing,Tooth/dental problems,Sore throat,  No sneezing, itching, ear ache, nasal congestion, post nasal drip,  Cardio-vascular:  No chest pain, Orthopnea, PND, swelling in lower extremities, anasarca, dizziness, palpitations  GI:  No heartburn, indigestion, abdominal pain, nausea, vomiting, diarrhea, change in bowel habits, loss of appetite  Skin:  no rash or lesions.  GU:  no dysuria, change in color of urine, no urgency or frequency. No flank pain.  Musculoskeletal:  No joint pain or swelling. No decreased range of motion. No back pain.  Psych:  No change in mood or affect. No depression or anxiety. No memory loss.   Past Medical History  Diagnosis Date  . Allergy   . Anxiety     followed [facility]/Psychiatry every six months.  . Hypertension   . Diabetes mellitus without complication   . Metastatic lung carcinoma 09/16/2014    Past Surgical History  Procedure Laterality Date  . Rotator cuff surgery      Right  . Lymph node biopsy Right 09/12/2014    Procedure: RIGHT NECK LYMPH NODE BIOPSY;  Surgeon: Jackolyn Confer, MD;  Location: [facility];  Service: General;  Laterality: Right;   Social History:  reports that he has been smoking Cigarettes.  He started smoking about 35 years ago. He has a 27 pack-year smoking history. He has never used smokeless tobacco. He reports that he does not drink alcohol or use illicit drugs.  No Known Allergies  Family History  Problem Relation Age of Onset  . Heart disease Mother     AMI  . Cancer Sister     breast  . Heart disease Brother     AMI  . Cancer Brother   . Heart attack Mother   . Heart attack Brother   . Stroke Neg Hx      Prior to Admission medications   Medication Sig Start Date End Date Taking? Authorizing Provider  acetaminophen (TYLENOL) 500 MG tablet Take 1 tablet (500 mg total) by mouth 3 (three) times daily. FOR SHOULDER PAIN FOR 5 DAYS 11/24/14 08/27/15  Liliane Shi, PA-C  Calcium Carbonate 1500 MG TABS Take 1 tablet (1,500 mg total) by mouth 3 (three) times daily. 11/24/14   Liliane Shi, PA-C  carvedilol (COREG) 6.25 MG tablet Take 1.5 tablets (9.375 mg total) by mouth 2 (two) times daily. 11/24/14   Liliane Shi, PA-C  Cholecalciferol (VITAMIN D3) 2000 UNITS TABS Take 2,000 tablets by mouth daily.  Historical Provider, MD  clonazePAM (KLONOPIN) 0.5 MG tablet Take 1 tablet (0.5 mg total) by mouth 3 (three) times daily as needed for anxiety. 11/24/14   Liliane Shi, PA-C  cloNIDine (CATAPRES) 0.1 MG tablet Take 0.1 mg by mouth 3 (three) times daily.    Historical Provider, MD  dexamethasone (DECADRON) 4 MG tablet Take 1 tablet (4 mg total) by mouth daily. 11/24/14   Liliane Shi, PA-C  escitalopram (LEXAPRO) 10 MG tablet Take 1 tablet (10 mg total) by mouth daily. 11/24/14   Liliane Shi, PA-C  fluconazole (DIFLUCAN) 100 MG tablet Take 1 tablet (100  mg total) by mouth daily. 11/24/14   Liliane Shi, PA-C  folic acid (FOLVITE) 1 MG tablet Take 1 tablet (1 mg total) by mouth daily. 11/24/14   Liliane Shi, PA-C  furosemide (LASIX) 20 MG tablet Take 40 mg in the AM and 20 mg in the PM 11/24/14   Scott T Kathlen Mody, PA-C  hydrALAZINE (APRESOLINE) 25 MG tablet Take 37.5 mg by mouth 2 (two) times daily.    Historical Provider, MD  insulin aspart (NOVOLOG) cartridge Inject 4 Units into the skin daily with lunch. 11/24/14   Liliane Shi, PA-C  Insulin Detemir (LEVEMIR FLEXTOUCH) 100 UNIT/ML Pen Inject 10 Units into the skin daily at 10 pm.    Historical Provider, MD  isosorbide dinitrate (ISORDIL) 30 MG tablet Take 1 tablet (30 mg total) by mouth 4 (four) times daily. 11/24/14   Liliane Shi, PA-C  levalbuterol (XOPENEX) 0.63 MG/3ML nebulizer solution Take 3 mLs (0.63 mg total) by nebulization every 4 (four) hours as needed for wheezing or shortness of breath. Patient not taking: Reported on 11/23/2014 11/24/14   Liliane Shi, PA-C  loperamide (IMODIUM) 2 MG capsule Take 1 capsule (2 mg total) by mouth as needed for diarrhea or loose stools. 11/24/14   Liliane Shi, PA-C  omeprazole (PRILOSEC) 20 MG capsule Take 1 capsule (20 mg total) by mouth daily. 11/24/14   Liliane Shi, PA-C  ondansetron (ZOFRAN) 4 MG tablet Take 1 tablet (4 mg total) by mouth every 6 (six) hours as needed for nausea or vomiting. 11/24/14   Liliane Shi, PA-C  promethazine (PHENERGAN) 25 MG tablet Take 1 tablet (25 mg total) by mouth every 6 (six) hours as needed for nausea or vomiting. Patient not taking: Reported on 12/05/2014 11/24/14   Liliane Shi, PA-C  traMADol (ULTRAM) 50 MG tablet Take 1 tablet (50 mg total) by mouth every 6 (six) hours as needed for moderate pain. 11/24/14   Liliane Shi, PA-C  vancomycin 750 mg in sodium chloride 0.9 % 150 mL Inject 750 mg into the vein daily.    Historical Provider, MD   Physical Exam: There were no vitals filed for this visit.  Wt  Readings from Last 3 Encounters:  11/23/14 94.802 kg (209 lb)  10/30/14 86.183 kg (190 lb)  10/26/14 83.915 kg (185 lb)    General:  Appears calm and comfortable Eyes: PERRL, normal lids, irises & conjunctiva ENT: grossly normal hearing, lips & tongue Neck: no LAD, masses or thyromegaly, +JVD Cardiovascular: RRR, Crackles on the right, port is tender to palpation, the PICC line is losing blood. Telemetry: SR, no arrhythmias  Respiratory: CTA bilaterally, no w/r/r. Normal respiratory effort. Abdomen: soft, ntnd Skin: Has a black toe on the left foot sensation seems to be intact, he has 3+ edema in his lower extremities. Psychiatric: grossly normal mood and  affect, speech fluent and appropriate Neurologic: grossly non-focal.          Labs on Admission:  Basic Metabolic Panel:  Recent Labs Lab 11/23/14 1619 12/13/2014 0917  NA 140 140  K 5.5* 4.9*  CL 102 103  CO2 28 29  GLUCOSE 71 138*  BUN 64* 62*  CREATININE 2.8* 2.9*  CALCIUM 8.3* 8.3   Liver Function Tests:  Recent Labs Lab 11/20/2014 0917  AST 29  ALT 12  ALKPHOS 66  BILITOT 0.40  PROT 5.7*   No results for input(s): LIPASE, AMYLASE in the last 168 hours. No results for input(s): AMMONIA in the last 168 hours. CBC:  Recent Labs Lab 11/18/2014 0917  WBC 16.2*  NEUTROABS 14.8*  HGB 9.1*  HCT 28.3*  MCV 90  PLT 130*   Cardiac Enzymes: No results for input(s): CKTOTAL, CKMB, CKMBINDEX, TROPONINI in the last 168 hours.  BNP (last 3 results)  Recent Labs  09/12/14 0620 11/23/14 1619  PROBNP 8587.0* 409.0*   CBG: No results for input(s): GLUCAP in the last 168 hours.  Radiological Exams on Admission: Ct Chest Wo Contrast  11/23/2014   CLINICAL DATA:  Metastatic lung cancer. Evaluate for progression. Renal failure.  EXAM: CT CHEST WITHOUT CONTRAST  TECHNIQUE: Multidetector CT imaging of the chest was performed following the standard protocol without IV contrast.  COMPARISON:  10/24/2014  FINDINGS:  Mediastinum/Nodes: Mild cardiomegaly. Trace pericardial fluid or thickening may be physiologic. Anemia, as evidenced by decreased density of the intravascular space.  A right Port-A-Cath new terminates at the low SVC. There is also a left PICC line which terminates at the low SVC.  Anasarca. Left gynecomastia. Left subpectoral node measures 1.2 cm on image 15 and is unchanged.  Aortic and branch vessel atherosclerosis.  High right paratracheal node measures 1.4 cm today versus 1.1 cm on the prior. Precarinal node measures 1.5 cm on image 29 versus 9 mm on the prior.  Prevascular node measures 1.2 cm on image 27 versus 8 mm on the prior. Hilar regions poorly evaluated without intravenous contrast.  Retrocrural node is newly enlarged at 1.4 cm on image 56.  Lungs/Pleura: Minimal motion degradation. Fluid or secretions within the left mainstem bronchus and at the level of the carina.  Moderate centrilobular emphysema. Worsening right lower lobe aeration with collapse/ consolidative change.  A subpleural left lower lobe lung nodule measures 6 mm on image 43 and is new or markedly enlarged from image 37 of the prior. Worsening left base atelectasis. The left lower lobe pulmonary nodule is partially obscured by atelectasis. Measures maximally 1.5 cm on image 45 today versus 1.7 cm on the prior.  Small to moderate right pleural effusion is minimally increased and demonstrates a suggestion of mild loculation inferiorly. Small left pleural effusion is slightly increased.  Upper abdomen: Probable periportal edema. Normal imaged portions of the spleen, stomach, right kidney. Upper abdominal adenopathy is difficult to evaluate secondary to lack of IV contrast and patient size. The celiac node measures 2.0 cm on image 63 and is similar to 1.9 cm on the prior. Perihepatic ascites, similar.  Musculoskeletal: Anterior right chest wall deformity could be developmental or postsurgical.  IMPRESSION: 1. Progression of thoracic  adenopathy. 2. Worsened bibasilar aeration. On the left, likely atelectasis. On the right, this could represent atelectasis or infection. 3. Left base airspace disease partially obscures the left lower lobe pulmonary nodule, which is felt to be similar. A more anterior left lower lobe nodule is new or  enlarged and suspicious for metastatic disease. 4. Slight increase in right greater than left pleural effusions. 5. Grossly similar upper abdominal adenopathy, suboptimally evaluated. 6. Similar perihepatic ascites and trace pericardial fluid. 7. Left-sided gynecomastia. 8. Fluid or secretions in the endobronchial tree. Clinically exclude aspiration. 9. Anasarca and probable anemia.   Electronically Signed   By: Abigail Miyamoto M.D.   On: 11/22/2014 12:19   US Venous Img Lower Unilateral Left  12/14/2014   CLINICAL DATA:  Left lower leg swelling with weeping edema.  EXAM: LEFT LOWER EXTREMITY VENOUS DOPPLER ULTRASOUND  TECHNIQUE: Gray-scale sonography with graded compression, as well as color Doppler and duplex ultrasound, were performed to evaluate the deep venous system from the level of the common femoral vein through the popliteal and proximal calf veins. Spectral Doppler was utilized to evaluate flow at rest and with distal augmentation maneuvers.  COMPARISON:  11/06/2014  FINDINGS: Right common femoral vein is compressible and patent.  Normal compressibility, augmentation and color Doppler flow in the left common femoral vein, left femoral vein and left popliteal vein. Prominent lymph nodes in the left groin.  Again noted is an irregular fluid collection in the medial left popliteal fossa. This collection roughly measures 5.5 x 1.7 x 1.9 cm. Findings are similar to the previous examination and consistent with a Baker's cyst.  Subcutaneous edema throughout the left ankle. Again noted is an occluded and non compressible left peroneal vein. This finding is similar to the previous examination. Left great saphenous  vein is difficult to visualize due to extensive subcutaneous edema. Posterior tibial veins appear to be patent.  IMPRESSION: Persistent deep vein thrombosis in the left peroneal vein. Minimal change from the previous examination. Deep vein thrombosis does not extend into the thigh.  Extensive subcutaneous edema throughout the left lower extremity.  Small Baker's cyst.   Electronically Signed   By: Markus Daft M.D.   On: 12/13/2014 12:06    EKG: Independently reviewed. pending  Assessment/Plan Acute systolic congestive heart failure - I'll go and start him on IV Lasix check a basic metabolic panel in the morning. - We'll have to call cardiology to see if he is candidate for inotropes. - Month  his electrolytes. - Strict I's and O's, get standing weights restrict his fluid intake. - Check cardiac enzymes and a TSH.  Essential hypertension - Continue BiDil and Coreg. - Bp is high.  Acute renal failure/cardiorenal syndrome: - Check a basic metabolic panel is most likely due to cardiorenal syndrome. - started IV lasix if no improvement will have to call cardiology.  Protein-calorie malnutrition, severe. - Ensure 3 times a day  Metastatic lung carcinoma: - Dr. interval follow along.  Diabetic foot ulcer associated with type 2 diabetes mellitus - Check an x-ray of the foot, he has been on empiric antibiotics, vancomycin. - I am concerned about a diabetic foot infection, he also has a Port-A-Cath and a PICC which might be infected. - We'll start him empirically on vancomycin and cefepime, check blood cultures 2. - Check RBC folate and B-12.  DVT, lower extremity, proximal, chronic - Not on any anticoagulation his repeat lower extremity Doppler showed this DVT was unchanged.  DM (diabetes mellitus), type 1 with peripheral vascular complications - Continue his home dose Levemir plus sliding scale insulin. Check hemoglobin A1c.   Code Status: full DVT Prophylaxis:heparin Family  Communication: wife and son Disposition Plan: inpatient  Time spent: 85 min  Charlynne Cousins [facility] Pager [phone]

## 2014-11-27 NOTE — Progress Notes (Signed)
ANTIBIOTIC CONSULT NOTE - INITIAL  Pharmacy Consult for Vancomcyin Indication: Empiric coverage  No Known Allergies  Patient Measurements: Height: 6\' 2"  (188 cm) Weight: 210 lb 8.6 oz (95.5 kg) IBW/kg (Calculated) : 82.2  Vital Signs: Temp: 97.6 F (36.4 C) (01/11 1900) Temp Source: Oral (01/11 1900) BP: 174/82 mmHg (01/11 1900) Pulse Rate: 79 (01/11 1900) Intake/Output from previous day:    Labs:  Recent Labs  12/16/2014 0917 11/19/2014 1904  WBC 16.2* 20.4*  HGB 9.1* 9.5*  PLT 130* 123*  CREATININE 2.9* 2.84*   Estimated Creatinine Clearance: 31.4 mL/min (by C-G formula based on Cr of 2.84).   No results for input(s): VANCOTROUGH, VANCOPEAK, VANCORANDOM, GENTTROUGH, GENTPEAK, GENTRANDOM, TOBRATROUGH, TOBRAPEAK, TOBRARND, AMIKACINPEAK, AMIKACINTROU, AMIKACIN in the last 72 hours.   Microbiology: No results found for this or any previous visit (from the past 720 hour(s)).  Medical History: Past Medical History  Diagnosis Date  . Allergy   . Anxiety     followed [facility]ry every six months.  . Hypertension   . Diabetes mellitus without complication   . Metastatic lung carcinoma 09/16/2014    Medications:  Anti-infectives    Start     Dose/Rate Route Frequency Ordered Stop   11/28/14 1000  fluconazole (DIFLUCAN) tablet 100 mg     100 mg Oral Daily 12/12/2014 1815     11/26/2014 2000  ceFEPIme (MAXIPIME) 1 g in dextrose 5 % 50 mL IVPB     1 g100 mL/hr over 30 Minutes Intravenous Every 12 hours 12/17/2014 1815       Assessment: 15 yoM admitted 1/11 with progressive failure to thrive, LE edema, SOB.  PMH includes end stage CHF (EF 25%), metastatic lung cancer, DM, anxiety, depression, DVT (currently on Xarelto), and worsening renal fxn.  Empiric broad spectrum antibiotics are started with concern for bacteremia, diabetic foot infection, and/or PAC/PICC line infections.  Pharmacy is consulted to continue dosing vancomycin.  [facility] Records of  PTA antibiotics: Fluconazole 100mg  daily (per MAG, no stop date, prophylactic) Completed 10 day course of Levaquin 750mg  daily x10 days on 1/8 Vancomycin 1500mg  IV q24h started 1/9 with last dose 1/11 at 0400  Inpatient antibiotics: (PTA 1/9 >>) 1/11 >> Vanc >> (PTA) 1/11 >> Fluconazole >>  1/11 >> Cefepime >>    Today, 12/13/2014:  Tmax: afebrile  WBCs: 16.2  Renal: SCr 2.84, CrCl ~ 31 ml/min CG (~ 27N)  Blood culture in process  Goal of Therapy:  Vancomycin trough level 15-20 mcg/ml  Plan:   No Vancomycin orders at this time.  Due to a change in renal function, doses will be re-entered when appropriate  Obtain vancomycin level at 0300 1/12 (before 4th outpatient dose time).  Follow up renal fxn and culture results as available.  Gretta Arab PharmD, BCPS Pager [phone] 12/03/2014 9:38 PM

## 2014-11-27 NOTE — Telephone Encounter (Signed)
New message     Want to make sure the nurse knew that when pt was seen recently--they sent the wrong med sheet with him,  It was the med sheet of another patients.  She faxed the correct sheet to [place].  Did you get it?

## 2014-11-27 NOTE — Progress Notes (Signed)
Hematology and Oncology Follow Up Visit  Ralph [name] [id number] Jan 21, 1951 64 y.o. 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 is in for follow-up.   He really is declining. He is in a will chair. He is in an assisted living facility/rehabilitation facility.  He is wearing oxygen.  His cardiologist called me last week. His cardiac function is quite poor. His ejection fraction is 20%.  He looks like he is losing weight. He has some temporal muscle wasting.  His left leg is quite swollen. It is weeping some fluid.  He has a little bit of a cough. He's had no fever.  It's really hard to say what his appetite is doing. He says he is eating. His albumin today was only 1.4.  I did go ahead and get a CT scan of his chest. It does show that he has progressive disease. He does have increasing and adenopathy. There is possible progressive pulmonary parenchymal disease.  I did get a Doppler of his left leg. He does have a thrombus in the peroneal vein.  I would have to say that his performance status is about ECOG 3.  I talked to him at length. His son and I think needs were with him. I was very blunt and honest. I just do not see him taking anymore chemotherapy unless he makes a dramatic improvement.    Medications: No current facility-administered medications for this visit. No current outpatient prescriptions on file. Facility-Administered Medications Ordered in Other Visits: 0.9 %  sodium chloride infusion, 250 mL, Intravenous, PRN, Charlynne Cousins, MD;  acetaminophen (TYLENOL) tablet 500 mg, 500 mg, Oral, TID, Charlynne Cousins, MD;  albuterol (PROVENTIL) (5 MG/ML) 0.5% nebulizer solution 2.5 mg, 2.5 mg, Nebulization, Q6H, Charlynne Cousins, MD;  Calcium Carbonate TABS 1,500 mg,  1,500 mg, Oral, TID, Charlynne Cousins, MD carvedilol (COREG) tablet 9.375 mg, 9.375 mg, Oral, BID, Charlynne Cousins, MD;  ceFEPIme (MAXIPIME) 1 g in dextrose 5 % 50 mL IVPB, 1 g, Intravenous, Q12H, Charlynne Cousins, MD;  clonazePAM [facility]) tablet 0.5 mg, 0.5 mg, Oral, TID PRN, Charlynne Cousins, MD;  dexamethasone (DECADRON) tablet 4 mg, 4 mg, Oral, Daily, Charlynne Cousins, MD;  escitalopram (LEXAPRO) tablet 10 mg, 10 mg, Oral, Daily, Charlynne Cousins, MD fluconazole (DIFLUCAN) tablet 100 mg, 100 mg, Oral, Daily, Charlynne Cousins, MD;  folic acid (FOLVITE) tablet 1 mg, 1 mg, Oral, Daily, Charlynne Cousins, MD;  furosemide (LASIX) 250 mg in dextrose 5 % 250 mL (1 mg/mL) infusion, 4 mg/hr, Intravenous, Continuous, Charlynne Cousins, MD;  heparin injection 5,000 Units, 5,000 Units, Subcutaneous, 3 times per day, Charlynne Cousins, MD insulin aspart (novoLOG) injection 0-5 Units, 0-5 Units, Subcutaneous, QHS, Charlynne Cousins, MD;  Derrill Memo ON 11/28/2014] insulin aspart (novoLOG) injection 0-9 Units, 0-9 Units, Subcutaneous, TID WC, Charlynne Cousins, MD;  Derrill Memo ON 11/28/2014] insulin aspart (novoLOG) injection 3 Units, 3 Units, Subcutaneous, TID WC, Charlynne Cousins, MD;  Insulin Detemir (LEVEMIR) FlexPen 10 Units, 10 Units, Subcutaneous, Q2200, Charlynne Cousins, MD isosorbide dinitrate (ISORDIL) tablet 30 mg, 30 mg, Oral, QID, Charlynne Cousins, MD;  ondansetron [facility]) tablet 4 mg, 4 mg, Oral, Q6H PRN **OR** ondansetron (ZOFRAN) injection  4 mg, 4 mg, Intravenous, Q6H PRN, Charlynne Cousins, MD;  ondansetron [facility]) tablet 4 mg, 4 mg, Oral, Q6H PRN, Charlynne Cousins, MD;  pantoprazole (PROTONIX) EC tablet 40 mg, 40 mg, Oral, Daily, Charlynne Cousins, MD polyethylene glycol (MIRALAX / GLYCOLAX) packet 17 g, 17 g, Oral, Daily PRN, Charlynne Cousins, MD;  promethazine (PHENERGAN) tablet 25 mg, 25 mg, Oral, Q6H PRN, Charlynne Cousins, MD;  sodium chloride 0.9 %  injection 3 mL, 3 mL, Intravenous, Q12H, Charlynne Cousins, MD;  sodium chloride 0.9 % injection 3 mL, 3 mL, Intravenous, PRN, Charlynne Cousins, MD  Allergies: No Known Allergies  Past Medical History, Surgical history, Social history, and Family History were reviewed and updated.  Review of Systems: As above  Physical Exam:  height is 5\' 10"  (1.778 m). His oral temperature is 97.6 F (36.4 C). His blood pressure is 150/75 and his pulse is 74. His respiration is 20.   Chronically ill-appearing African-American gentleman in no obvious distress. Head and neck exam shows no ocular or oral lesions. He has no  oral thrush. There is no adenopathy in the neck. Lungs are  with decreased breath sounds bilaterally. He has no rales, wheezes or rhonchi. Cardiac exam is regular rate and rhythm with a normal S1 and S2. There are no murmurs, rubs or bruits. Abdomen is soft. He has good bowel sounds. There is no fluid wave. There is no palpable liver or spleen tip. Back exam no tenderness over the spine, ribs or hips. Extremities smarked pitting edema of the left leg from the knee down area did there is some weeping. No tenderness is noted. No erythema is noted. Right leg has minimal edema.       Lab Results  Component Value Date   WBC 16.2* 12/15/2014   HGB 9.1* 11/23/2014   HCT 28.3* 11/17/2014   MCV 90 11/17/2014   PLT 130* 11/28/2014     Chemistry      Component Value Date/Time   NA 140 11/26/2014 0917   NA 140 11/23/2014 1619   K 4.9* 11/23/2014 0917   K 5.5* 11/23/2014 1619   CL 103 12/03/2014 0917   CL 102 11/23/2014 1619   CO2 29 12/04/2014 0917   CO2 28 11/23/2014 1619   BUN 62* 12/04/2014 0917   BUN 64* 11/23/2014 1619   CREATININE 2.9* 11/28/2014 0917   CREATININE 2.8* 11/23/2014 1619      Component Value Date/Time   CALCIUM 8.3 11/23/2014 0917   CALCIUM 8.3* 11/23/2014 1619   ALKPHOS 66 11/21/2014 0917   ALKPHOS 59 10/04/2014 1507   AST 29 11/26/2014 0917   AST 15  10/04/2014 1507   ALT 12 12/06/2014 0917   ALT 9 10/04/2014 1507   BILITOT 0.40 12/11/2014 0917   BILITOT 0.2 10/04/2014 1507         Impression and Plan: Ralph [name] is 64 year old gentleman with metastatic adenocarcinoma of the lung. This was a biopsy proven. it is mutation wild type  Again, he is declining. I really do not see him in taking any more chemotherapy. I certainly will not give him chemotherapy today.  I spent about 45 minutes to 50 minutes with him and his family. His cousin, who is a Marine scientist, was listed on the phone.  I told him that I felt that we needed to focus on comfort care. I thought that he would need hospice. I'm going to check a prealbumin level on him.  Again, I  told him that I was not going to treat him. I did not see that chemotherapy was going to benefit him. I think the chance of him responding to chemotherapy will be easily less than 10%.  He has worsening renal dysfunction. I think a lot of this is from his poor cardiac function.  I would suspect that his prealbumin will be less than 10.  He needs to be hospitalized. We will get him hospitalized. The hospitalist will be very gracious and admit him for [place].  I need to talk to him in the hospital about [place]. I just don't think he understands what type of poor shape he is in. If he were to be kept alive on a ventilator, he would never come off and this would not benefit his overall life.   He probably needs heparin for right now for this thrombus in the left leg.  This was incurably complicated today. I did not realize the kind of shape that he was really in.     Volanda Napoleon, MD 1/11/20166:49 PM

## 2014-11-27 NOTE — Progress Notes (Signed)
Patient came up from 3W with a condom catheter. Patient has an order for foley catheter placement but patient refused to have one. We will make sure condom catheter is in place at all times.

## 2014-11-27 NOTE — Telephone Encounter (Signed)
Deb from [facility] calling stating that the wrong medication list was sent with pt when he saw Margaret Pyle on 1/7.  She will be faxing over the correct meds.  They have not changed any of his meds. Will forward message to Eugenia Mcalpine, PA who will be seeing him on 1/13.

## 2014-11-28 ENCOUNTER — Inpatient Hospital Stay (HOSPITAL_COMMUNITY): Payer: Medicaid Other

## 2014-11-28 DIAGNOSIS — C770 Secondary and unspecified malignant neoplasm of lymph nodes of head, face and neck: Secondary | ICD-10-CM

## 2014-11-28 DIAGNOSIS — D72829 Elevated white blood cell count, unspecified: Secondary | ICD-10-CM

## 2014-11-28 DIAGNOSIS — M60075 Infective myositis, unspecified foot: Secondary | ICD-10-CM

## 2014-11-28 DIAGNOSIS — E1169 Type 2 diabetes mellitus with other specified complication: Secondary | ICD-10-CM

## 2014-11-28 DIAGNOSIS — L03119 Cellulitis of unspecified part of limb: Secondary | ICD-10-CM

## 2014-11-28 DIAGNOSIS — I82502 Chronic embolism and thrombosis of unspecified deep veins of left lower extremity: Secondary | ICD-10-CM

## 2014-11-28 DIAGNOSIS — D696 Thrombocytopenia, unspecified: Secondary | ICD-10-CM

## 2014-11-28 DIAGNOSIS — I825Y1 Chronic embolism and thrombosis of unspecified deep veins of right proximal lower extremity: Secondary | ICD-10-CM

## 2014-11-28 DIAGNOSIS — D649 Anemia, unspecified: Secondary | ICD-10-CM

## 2014-11-28 DIAGNOSIS — I1310 Hypertensive heart and chronic kidney disease without heart failure, with stage 1 through stage 4 chronic kidney disease, or unspecified chronic kidney disease: Secondary | ICD-10-CM | POA: Insufficient documentation

## 2014-11-28 DIAGNOSIS — L089 Local infection of the skin and subcutaneous tissue, unspecified: Secondary | ICD-10-CM

## 2014-11-28 DIAGNOSIS — A4902 Methicillin resistant Staphylococcus aureus infection, unspecified site: Secondary | ICD-10-CM

## 2014-11-28 DIAGNOSIS — N19 Unspecified kidney failure: Secondary | ICD-10-CM

## 2014-11-28 DIAGNOSIS — E46 Unspecified protein-calorie malnutrition: Secondary | ICD-10-CM

## 2014-11-28 DIAGNOSIS — C3432 Malignant neoplasm of lower lobe, left bronchus or lung: Secondary | ICD-10-CM

## 2014-11-28 LAB — GLUCOSE, CAPILLARY
GLUCOSE-CAPILLARY: 126 mg/dL — AB (ref 70–99)
Glucose-Capillary: 165 mg/dL — ABNORMAL HIGH (ref 70–99)
Glucose-Capillary: 104 mg/dL — ABNORMAL HIGH (ref 70–99)
Glucose-Capillary: 142 mg/dL — ABNORMAL HIGH (ref 70–99)

## 2014-11-28 LAB — COMPREHENSIVE METABOLIC PANEL
ALT: 15 U/L (ref 0–53)
ANION GAP: 9 (ref 5–15)
AST: 28 U/L (ref 0–37)
Albumin: 1.5 g/dL — ABNORMAL LOW (ref 3.5–5.2)
Alkaline Phosphatase: 66 U/L (ref 39–117)
BUN: 70 mg/dL — ABNORMAL HIGH (ref 6–23)
CO2: 27 mmol/L (ref 19–32)
Calcium: 7.5 mg/dL — ABNORMAL LOW (ref 8.4–10.5)
Chloride: 103 mEq/L (ref 96–112)
Creatinine, Ser: 2.86 mg/dL — ABNORMAL HIGH (ref 0.50–1.35)
GFR calc non Af Amer: 22 mL/min — ABNORMAL LOW (ref >90)
GFR, EST AFRICAN AMERICAN: 26 mL/min — AB (ref >90)
GLUCOSE: 218 mg/dL — AB (ref 70–99)
Potassium: 4.9 mmol/L (ref 3.5–5.1)
SODIUM: 139 mmol/L (ref 135–145)
TOTAL PROTEIN: 5.2 g/dL — AB (ref 6.0–8.3)
Total Bilirubin: 0.4 mg/dL (ref 0.3–1.2)

## 2014-11-28 LAB — CBC
HEMATOCRIT: 27.3 % — AB (ref 39.0–52.0)
Hemoglobin: 8.6 g/dL — ABNORMAL LOW (ref 13.0–17.0)
MCH: 28.4 pg (ref 26.0–34.0)
MCHC: 31.5 g/dL (ref 30.0–36.0)
MCV: 90.1 fL (ref 78.0–100.0)
Platelets: 118 10*3/uL — ABNORMAL LOW (ref 150–400)
RBC: 3.03 MIL/uL — ABNORMAL LOW (ref 4.22–5.81)
RDW: 22.1 % — AB (ref 11.5–15.5)
WBC: 16.8 10*3/uL — ABNORMAL HIGH (ref 4.0–10.5)

## 2014-11-28 LAB — SODIUM, URINE, RANDOM: Sodium, Ur: 41 mmol/L

## 2014-11-28 LAB — TROPONIN I
TROPONIN I: 0.06 ng/mL — AB (ref <0.031)
Troponin I: 0.06 ng/mL — ABNORMAL HIGH (ref <0.031)

## 2014-11-28 LAB — VANCOMYCIN, TROUGH: Vancomycin Tr: 29.6 ug/mL (ref 10.0–20.0)

## 2014-11-28 LAB — PREALBUMIN: Prealbumin: 10.5 mg/dL — ABNORMAL LOW (ref 17.0–34.0)

## 2014-11-28 LAB — HEMOGLOBIN A1C
Hgb A1c MFr Bld: 6.2 % — ABNORMAL HIGH (ref <5.7)
Mean Plasma Glucose: 131 mg/dL — ABNORMAL HIGH (ref <117)

## 2014-11-28 LAB — APTT: APTT: 62 s — AB (ref 24–37)

## 2014-11-28 MED ORDER — INSULIN GLARGINE 100 UNIT/ML ~~LOC~~ SOLN
10.0000 [IU] | Freq: Two times a day (BID) | SUBCUTANEOUS | Status: DC
  Administered 2014-11-28: 10 [IU] via SUBCUTANEOUS
  Filled 2014-11-28 (×3): qty 0.1

## 2014-11-28 MED ORDER — ISOSORB DINITRATE-HYDRALAZINE 20-37.5 MG PO TABS
1.0000 | ORAL_TABLET | Freq: Two times a day (BID) | ORAL | Status: DC
Start: 2014-11-28 — End: 2014-12-07
  Administered 2014-11-28 – 2014-12-06 (×18): 1 via ORAL
  Filled 2014-11-28 (×20): qty 1

## 2014-11-28 MED ORDER — DEXAMETHASONE 2 MG PO TABS
2.0000 mg | ORAL_TABLET | Freq: Every day | ORAL | Status: DC
  Administered 2014-11-28 – 2014-11-29 (×2): 2 mg via ORAL
  Filled 2014-11-28 (×3): qty 1

## 2014-11-28 MED ORDER — HEPARIN (PORCINE) IN NACL 100-0.45 UNIT/ML-% IJ SOLN: 1500.0000 [IU]/h | INTRAMUSCULAR | Status: DC

## 2014-11-28 NOTE — Progress Notes (Signed)
ANTIBIOTIC CONSULT NOTE - INITIAL  Pharmacy Consult for vancomycin Indication: Empiric coverage  No Known Allergies  Patient Measurements: Height: 6\' 2"  (188 cm) Weight: 210 lb 8.6 oz (95.5 kg) IBW/kg (Calculated) : 82.2 Adjusted Body Weight:   Vital Signs: Temp: 97.6 F (36.4 C) (01/11 1900) Temp Source: Oral (01/11 1900) BP: 174/82 mmHg (01/11 1900) Pulse Rate: 79 (01/11 1900) Intake/Output from previous day: 01/11 0701 - 01/12 0700 In: -  Out: 450 [Urine:450] Intake/Output from this shift: Total I/O In: -  Out: 450 [Urine:450]  Labs:  Recent Labs  11/20/2014 0917 11/21/2014 1904 11/28/14 0305  WBC 16.2* 20.4* 16.8*  HGB 9.1* 9.5* 8.6*  PLT 130* 123* 118*  CREATININE 2.9* 2.84* 2.86*   Estimated Creatinine Clearance: 31.1 mL/min (by C-G formula based on Cr of 2.86).  Recent Labs  11/28/14 0305  VANCOTROUGH 29.6*     Microbiology: No results found for this or any previous visit (from the past 720 hour(s)).  Medical History: Past Medical History  Diagnosis Date  . Allergy   . Anxiety     followed [facility]/Psychiatry every six months.  . Hypertension   . Diabetes mellitus without complication   . Metastatic lung carcinoma 09/16/2014    Medications:  Anti-infectives    Start     Dose/Rate Route Frequency Ordered Stop   11/28/14 1000  fluconazole (DIFLUCAN) tablet 100 mg     100 mg Oral Daily 11/26/2014 1815     12/12/2014 2000  ceFEPIme (MAXIPIME) 1 g in dextrose 5 % 50 mL IVPB     1 g100 mL/hr over 30 Minutes Intravenous Every 12 hours 11/23/2014 1815       Assessment: Patient on vancomycin as outpatient.  Patient with poor renal function.  Goal of Therapy:  Vancomycin trough level 15-20 mcg/ml  Plan:  Measure antibiotic drug levels at steady state Follow up culture results Recheck level at 0800 1/13  Tyler Deis, Shea Stakes Crowford 11/28/2014,4:58 AM

## 2014-11-28 NOTE — Progress Notes (Signed)
Ralph [name]   DOB:07-08-1951   YJ#:[id number]   YOV#:[id number]  Patient Care Team: No Pcp Per Patient as PCP - General (General Practice)  Subjective: No new issues overnight. Symptoms not improving. Weak appearing. Lower extremity edema not improved. Short of breath with exertion, Denies chest pain. He denies any appetite changes. No confusion reported.   Scheduled Meds: . albuterol  2.5 mg Nebulization TID  . calcium carbonate  1,250 mg Oral TID  . carvedilol  9.375 mg Oral BID WC  . ceFEPime (MAXIPIME) IV  1 g Intravenous Q12H  . dexamethasone  2 mg Oral Daily  . escitalopram  10 mg Oral Daily  . insulin aspart  0-5 Units Subcutaneous QHS  . insulin aspart  0-9 Units Subcutaneous TID WC  . insulin aspart  3 Units Subcutaneous TID WC  . insulin glargine  10 Units Subcutaneous BID  . isosorbide-hydrALAZINE  1 tablet Oral BID  . pantoprazole  40 mg Oral Daily  . sodium chloride  3 mL Intravenous Q12H   Continuous Infusions: . furosemide (LASIX) infusion 8 mg/hr (11/28/14 0847)  . [START ON 11/24/2014] heparin     PRN Meds:sodium chloride, acetaminophen, clonazePAM, ondansetron **OR** ondansetron (ZOFRAN) IV, polyethylene glycol, promethazine, sodium chloride   Objective:  Filed Vitals:   11/28/14 0626  BP: 142/74  Pulse: 84  Temp: 98.8 F (37.1 C)  Resp: 20      Intake/Output Summary (Last 24 hours) at 11/28/14 1101 Last data filed at 11/28/14 1044  Gross per 24 hour  Intake 365.67 ml  Output    551 ml  Net -185.33 ml    ECOG PERFORMANCE STATUS: 3  GENERAL:alert, no distress and comfortable SKIN: skin color, texture, turgor are normal, no rashes or significant lesions EYES: normal, conjunctiva are pink and non-injected, sclera clear OROPHARYNX:no exudate, no erythema and lips, buccal mucosa, and tongue normal  NECK: supple, thyroid normal size, non-tender, without nodularity LYMPH:  no palpable lymphadenopathy in the cervical, axillary or  inguinal LUNGS:decreased breath sounds bilaterally.  HEART: regular rate & rhythm and no murmurs and 2-3+ left lower extremity edema, minimal right lower leg edema ABDOMEN:abdomen soft, non-tender and normal bowel sounds Musculoskeletal:no cyanosis of digits and no clubbing  PSYCH: alert & oriented x 3 with fluent speech NEURO: no focal motor/sensory deficits    CBG (last 3)   Recent Labs  12/15/2014 2250 11/28/14 0745  GLUCAP 321* 165*     Labs:   Recent Labs Lab 11/26/2014 0917 11/18/2014 1904 11/28/14 0305  WBC 16.2* 20.4* 16.8*  HGB 9.1* 9.5* 8.6*  HCT 28.3* 29.7* 27.3*  PLT 130* 123* 118*  MCV 90 89.5 90.1  MCH 29.0 28.6 28.4  MCHC 32.2 32.0 31.5  RDW 22.1* 22.0* 22.1*  LYMPHSABS 0.6* 0.2*  --   MONOABS  --  0.6  --   EOSABS 0.0 0.0  --   BASOSABS 0.0 0.0  --      Chemistries:    Recent Labs Lab 11/23/14 1619 12/15/2014 0917 11/24/2014 1904 11/28/14 0305  NA 140 140  --  139  K 5.5* 4.9*  --  4.9  CL 102 103  --  103  CO2 28 29  --  27  GLUCOSE 71 138*  --  218*  BUN 64* 62*  --  70*  CREATININE 2.8* 2.9* 2.84* 2.86*  CALCIUM 8.3* 8.3  --  7.5*  AST  --  29  --  28  ALT  --  12  --  15  ALKPHOS  --  66  --  66  BILITOT  --  0.40  --  0.4    GFR Estimated Creatinine Clearance: 31.1 mL/min (by C-G formula based on Cr of 2.86).  Liver Function Tests:  Recent Labs Lab 11/25/2014 0917 11/28/14 0305  AST 29 28  ALT 12 15  ALKPHOS 66 66  BILITOT 0.40 0.4  PROT 5.7* 5.2*  ALBUMIN  --  1.5*    Urine Studies     Component Value Date/Time   COLORURINE YELLOW 09/12/2014 0311   APPEARANCEUR CLEAR 09/12/2014 0311   LABSPEC 1.044* 09/12/2014 0311   PHURINE 5.5 09/12/2014 0311   GLUCOSEU 100* 09/12/2014 0311   HGBUR SMALL* 09/12/2014 0311   BILIRUBINUR NEGATIVE 09/12/2014 0311   BILIRUBINUR neg 12/18/2012 1931   KETONESUR NEGATIVE 09/12/2014 0311   PROTEINUR >300* 09/12/2014 0311   PROTEINUR 100 12/18/2012 1931   UROBILINOGEN 0.2 09/12/2014  0311   UROBILINOGEN 0.2 12/18/2012 1931   NITRITE NEGATIVE 09/12/2014 0311   NITRITE neg 12/18/2012 1931   LEUKOCYTESUR NEGATIVE 09/12/2014 0311    Coagulation profile  Recent Labs Lab 11/28/2014 1904  INR 3.16*    Cardiac Enzymes:  Recent Labs Lab 11/20/2014 1904 11/28/14 11/28/14 0610  TROPONINI 0.07* 0.06* 0.06*   BNP: Invalid input(s): POCBNP CBG:  Recent Labs Lab 11/26/2014 2250 11/28/14 0745  GLUCAP 321* 165*   D-Dimer No results for input(s): DDIMER in the last 72 hours. Hgb A1c  Recent Labs  11/22/2014 1904  HGBA1C 6.2*   Lipid Profile No results for input(s): CHOL, HDL, LDLCALC, TRIG, CHOLHDL, LDLDIRECT in the last 72 hours. Thyroid function studies No results for input(s): TSH, T4TOTAL, T3FREE, THYROIDAB in the last 72 hours.  Invalid input(s): FREET3 Microbiology No results found for this or any previous visit (from the past 240 hour(s)).     Imaging Studies:  Ct Chest Wo Contrast  12/05/2014   CLINICAL DATA:  Metastatic lung cancer. Evaluate for progression. Renal failure.  EXAM: CT CHEST WITHOUT CONTRAST  TECHNIQUE: Multidetector CT imaging of the chest was performed following the standard protocol without IV contrast.  COMPARISON:  10/24/2014  FINDINGS: Mediastinum/Nodes: Mild cardiomegaly. Trace pericardial fluid or thickening may be physiologic. Anemia, as evidenced by decreased density of the intravascular space.  A right Port-A-Cath new terminates at the low SVC. There is also a left PICC line which terminates at the low SVC.  Anasarca. Left gynecomastia. Left subpectoral node measures 1.2 cm on image 15 and is unchanged.  Aortic and branch vessel atherosclerosis.  High right paratracheal node measures 1.4 cm today versus 1.1 cm on the prior. Precarinal node measures 1.5 cm on image 29 versus 9 mm on the prior.  Prevascular node measures 1.2 cm on image 27 versus 8 mm on the prior. Hilar regions poorly evaluated without intravenous contrast.   Retrocrural node is newly enlarged at 1.4 cm on image 56.  Lungs/Pleura: Minimal motion degradation. Fluid or secretions within the left mainstem bronchus and at the level of the carina.  Moderate centrilobular emphysema. Worsening right lower lobe aeration with collapse/ consolidative change.  A subpleural left lower lobe lung nodule measures 6 mm on image 43 and is new or markedly enlarged from image 37 of the prior. Worsening left base atelectasis. The left lower lobe pulmonary nodule is partially obscured by atelectasis. Measures maximally 1.5 cm on image 45 today versus 1.7 cm on the prior.  Small to moderate right pleural effusion is minimally increased and demonstrates  a suggestion of mild loculation inferiorly. Small left pleural effusion is slightly increased.  Upper abdomen: Probable periportal edema. Normal imaged portions of the spleen, stomach, right kidney. Upper abdominal adenopathy is difficult to evaluate secondary to lack of IV contrast and patient size. The celiac node measures 2.0 cm on image 63 and is similar to 1.9 cm on the prior. Perihepatic ascites, similar.  Musculoskeletal: Anterior right chest wall deformity could be developmental or postsurgical.  IMPRESSION: 1. Progression of thoracic adenopathy. 2. Worsened bibasilar aeration. On the left, likely atelectasis. On the right, this could represent atelectasis or infection. 3. Left base airspace disease partially obscures the left lower lobe pulmonary nodule, which is felt to be similar. A more anterior left lower lobe nodule is new or enlarged and suspicious for metastatic disease. 4. Slight increase in right greater than left pleural effusions. 5. Grossly similar upper abdominal adenopathy, suboptimally evaluated. 6. Similar perihepatic ascites and trace pericardial fluid. 7. Left-sided gynecomastia. 8. Fluid or secretions in the endobronchial tree. Clinically exclude aspiration. 9. Anasarca and probable anemia.   Electronically Signed    By: Abigail Miyamoto M.D.   On: 12/03/2014 12:19   US Venous Img Lower Unilateral Left  12/11/2014   CLINICAL DATA:  Left lower leg swelling with weeping edema.  EXAM: LEFT LOWER EXTREMITY VENOUS DOPPLER ULTRASOUND  TECHNIQUE: Gray-scale sonography with graded compression, as well as color Doppler and duplex ultrasound, were performed to evaluate the deep venous system from the level of the common femoral vein through the popliteal and proximal calf veins. Spectral Doppler was utilized to evaluate flow at rest and with distal augmentation maneuvers.  COMPARISON:  11/06/2014  FINDINGS: Right common femoral vein is compressible and patent.  Normal compressibility, augmentation and color Doppler flow in the left common femoral vein, left femoral vein and left popliteal vein. Prominent lymph nodes in the left groin.  Again noted is an irregular fluid collection in the medial left popliteal fossa. This collection roughly measures 5.5 x 1.7 x 1.9 cm. Findings are similar to the previous examination and consistent with a Baker's cyst.  Subcutaneous edema throughout the left ankle. Again noted is an occluded and non compressible left peroneal vein. This finding is similar to the previous examination. Left great saphenous vein is difficult to visualize due to extensive subcutaneous edema. Posterior tibial veins appear to be patent.  IMPRESSION: Persistent deep vein thrombosis in the left peroneal vein. Minimal change from the previous examination. Deep vein thrombosis does not extend into the thigh.  Extensive subcutaneous edema throughout the left lower extremity.  Small Baker's cyst.   Electronically Signed   By: Markus Daft M.D.   On: 12/17/2014 12:06   Dg Chest Port 1 View  12/05/2014   CLINICAL DATA:  PICC placement.  Initial encounter.  EXAM: PORTABLE CHEST - 1 VIEW  COMPARISON:  Chest radiograph performed 11/23/2014, and CT of the chest performed earlier today at 11:42 a.m.  FINDINGS: The patient's left PICC is again  noted ending about the mid to distal SVC. A right-sided chest port is also noted ending about the mid to distal SVC.  There is a moderate right-sided pleural effusion. Vascular congestion is noted. The left lung base is incompletely imaged on this study. No pneumothorax is seen.  The cardiomediastinal silhouette is borderline normal in size. No acute osseous abnormalities are identified.  IMPRESSION: 1. Left PICC noted ending about the mid to distal SVC. 2. Moderate right-sided pleural effusion noted. Vascular congestion seen.  Electronically Signed   By: Garald Balding M.D.   On: 11/26/2014 22:23   Dg Foot Complete Right  11/28/2014   CLINICAL DATA:  Wheelchair ran over right foot, with right great toe discoloration and laceration. Initial encounter.  EXAM: RIGHT FOOT COMPLETE - 3+ VIEW  COMPARISON:  None.  FINDINGS: There is no definite evidence of osseous erosion to suggest osteomyelitis, though evaluation for osteomyelitis is limited on radiograph. There is no evidence of fracture or dislocation. There appears to be soft tissue irregularity at the right great toe. Visualized joint spaces are preserved. Mild diffuse soft tissue swelling is noted about the midfoot and hindfoot.  The subtalar joint is unremarkable in appearance. Scattered vascular calcifications are seen.  IMPRESSION: 1. No definite evidence of osseous erosion to suggest osteomyelitis, though evaluation for osteomyelitis is limited on radiograph. No evidence of fracture or dislocation. 2. Scattered vascular calcifications seen.   Electronically Signed   By: Garald Balding M.D.   On: 12/09/2014 23:24    Assessment/Plan: 64 y.o.   Metastatic Adenocarcinoma of the lung Biopsy proven, mutation wild type No further chemo treatments unless patient shows dramatic clinical improvement Comfort care recommended.   Renal Failure Due to poor cardiac function Appreciate Internal Medicine involvement  Acute systolic CHF On IV Lasix as per  admitting team  Chronic lower extremity DVT Xarelto initiated on 1/11 If renal function worsens, switch to heparin per pharmacy. Appreciate Pharmacy involvement  Severe malnutrition On Ensure tid  Anemia Due to ,malnutrition, dilution, malignancy, chemo No transfusion is indicated at this time Monitor counts closely  Thrombocytopenia In the setting of malignancy, dilution, chemo No transfusion is indicated at this time Monitor counts closely  Leukocytosis This is due to Decadron,  No intervention is indicated at this time Will continue to monitor  Full Code  Other medical issues as per admitting team     **Disclaimer: This note was dictated with voice recognition software. Similar sounding words can inadvertently be transcribed and this note may contain transcription errors which may not have been corrected upon publication of note.** WERTMAN,SARA E, PA-C 11/28/2014  11:01 AM   ADDENDUM: i saw and examined the patient.  His prealbumin is actually better than I thought.    ID is seeing him.  Surgery is seeing him.    There is a comment from ID that he has MRSA bacteremia.  MRI of foot with myofacsitis/cellluitis.  I suppose that his clinical decline might be mainly from infection and not malignancy.    He is on Xarelto for DVT in left leg.  It will be very interesting to see how he does with treatment for infection.  His recent CT scan shows some progression with lymphadenopathy.   I very much apprciate the help from everybody.  It will be informative to see what his echo shows.  ?? If he has endocarditiis.  If he truly as bacteremia. With the port in place, he could seed this.  We may need to consider removing this.   We will follow him closely.   Waldemar Dickens 66:5

## 2014-11-28 NOTE — Progress Notes (Addendum)
ANTICOAGULATION CONSULT NOTE - Initial Consult  Pharmacy Consult for heparin Indication: history of DVT  No Known Allergies  Patient Measurements: Height: _0  (188 cm) Weight: 209 lb 10.5 oz (95.1 kg) IBW/kg (Calculated) : 82.2 Heparin Dosing Weight: 95kg  Vital Signs: Temp: 98.8 F (37.1 C) (01/12 0626) Temp Source: Oral (01/12 0626) BP: 142/74 mmHg (01/12 0626) Pulse Rate: 84 (01/12 0626)  Labs:  Recent Labs  11/21/2014 0917 11/21/2014 1904 11/28/14 11/28/14 0305 11/28/14 0610  HGB 9.1* 9.5*  --  8.6*  --   HCT 28.3* 29.7*  --  27.3*  --   PLT 130* 123*  --  118*  --   LABPROT  --  32.7*  --   --   --   INR  --  3.16*  --   --   --   CREATININE 2.9* 2.84*  --  2.86*  --   TROPONINI  --  0.07* 0.06*  --  0.06*    Estimated Creatinine Clearance: 31.1 mL/min (by C-G formula based on Cr of 2.86).   Medical History: Past Medical History  Diagnosis Date  . Allergy   . Anxiety     followed [facility] every six months.  . Hypertension   . Diabetes mellitus without complication   . Metastatic lung carcinoma 09/16/2014    Assessment: 55 YOM with met lung cancer and history LLE DVT with possible upper extremity DVT. He has been on xarelto at [facility].  There is a note from 12/22 that xarelto was being started for DVT.  Looks like rehab re-started the 9m BID x 21 days from 11/20/14 (to change to 227mdaily 1/26)  Last dose of xarelto 1/12 8am  Today, 11/28/2014   CBC: Hgb = 8.6 (stable from previous values), pltc = 118 and trending down  Baseline aPTT in process from 1/12  SCr = 2.86 (worsening), CrCL ~3131min, on lasix gtt with orders to increase  Goal of Therapy:  Heparin level 0.3-0.7 units/ml Monitor platelets by anticoagulation protocol: Yes   Plan:   Since xarelto dose given this am, based on current renal function and expected decreased clearance, start heparin gtt 1500 units/hr at 08:00 12/14/2014  Expect xarelto's lingering effects to  influence heparin level (anti-Xa), suggest using aPTT to help guide therapy (aptt goal 66-102 sec)  Check daily CBC and heparin levels starting 1/14  Ralph [name], Ralph [name].   Pager: [phone] 1944/10/2015,9:11 AM  Addendum: Plans for surgery to remove infected  implanted port 1/13am, Thus Ralph [name] instructed to not start heparin gtt in am as originally planned.  Plan:  F/u appropriateness to start heparin follow OR 1/13  Ralph [name], Ralph [name].   Pager: [phone]/2016 3:36 PM

## 2014-11-28 NOTE — Consult Note (Signed)
Reason for Consult: Infected Porta cath Referring Physician: Dr. Bess Harvest Ralph [name] is an 63 y.o. male.  HPI: 64 y/o admitted yesterday with admitted with generalized weakness.  He has reported White substance coming out of his Port 2 weeks ago when they tried to use it in the ED. Work up so far here shows he is afebrile.  WBC was 16.8, H/H 8.6/27.3, platelets 118.  Creatinine is up to 2.86. CXR shows a PICC and moderate right sided pleural effusion.  He has a non healing foot ulcer on right, but films show no definite osteomyelitis.  He was recently seen at [facility] for generalized pain and malaise.  We now have blood cultures from [place] that are MRSA positive. He is admitted now with pain right shoulder and neck.  Malaise, acute renal failure, and now MRSA positive blood cultures.     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  Hx of tobacco use     Past Surgical History  Procedure Laterality Date  . Rotator cuff surgery      Right  . Lymph node biopsy Right 09/12/2014    Procedure: RIGHT NECK LYMPH NODE BIOPSY;  Surgeon: Jackolyn Confer, MD;  Location: [facility] OR[facility];  Service: General;  Laterality: Right;    Family History  Problem Relation Age of Onset  . Heart disease Mother     AMI  . Cancer Sister     breast  . Heart disease Brother     AMI  . Cancer Brother   . Heart attack Mother   . Heart attack Brother   . Stroke Neg Hx     Social History:  reports that he has been smoking Cigarettes.  He started smoking about 35 years ago. He has a 27 pack-year smoking history. He has never used smokeless tobacco. He reports that he does not drink alcohol or use illicit drugs.  Allergies: No Known  Allergies  Medications:  Prior to Admission:  Prescriptions prior to admission  Medication Sig Dispense Refill Last Dose  . Calcium Carbonate 1500 MG TABS Take 1 tablet (1,500 mg total) by mouth 3 (three) times daily.   11/20/2014 at Unknown time  . carvedilol (COREG) 6.25 MG tablet Take 1.5 tablets (9.375 mg total) by mouth 2 (two) times daily.   11/24/2014 at 0800  . Cholecalciferol (VITAMIN D3) 2000 UNITS TABS Take 2,000 tablets by mouth daily.   12/15/2014 at Unknown time  . clonazePAM (KLONOPIN) 0.5 MG tablet Take 1 tablet (0.5 mg total) by mouth 3 (three) times daily as needed for anxiety.   Past Week at Unknown time  . dexamethasone (DECADRON) 4 MG tablet Take 1 tablet (4 mg total) by mouth daily.   12/16/2014 at Unknown time  . escitalopram (LEXAPRO) 10 MG tablet Take 1 tablet (10 mg total) by mouth daily.   12/06/2014 at Unknown time  . fluconazole (DIFLUCAN) 100 MG tablet Take 1 tablet (100 mg total) by mouth daily.   12/16/2014 at Unknown time  . folic acid (FOLVITE) 1 MG tablet Take 1 tablet (1 mg total) by mouth daily.   12/16/2014 at Unknown time  . furosemide (LASIX) 20 MG tablet Take 40 mg in the AM and 20  mg in the PM (Patient taking differently: Take 40 mg by mouth daily. Take 40 mg in the AM and 20 mg in the PM)   12/05/2014 at Unknown time  . hydrALAZINE (APRESOLINE) 25 MG tablet Take 37.5 mg by mouth 2 (two) times daily.   11/26/2014 at Unknown time  . insulin aspart (NOVOLOG) cartridge Inject 4 Units into the skin daily with lunch.   11/26/2014 at Unknown time  . Insulin Glargine (LANTUS SOLOSTAR) 100 UNIT/ML Solostar Pen Inject 10 Units into the skin at bedtime.   11/26/2014 at Unknown time  . isosorbide dinitrate (ISORDIL) 30 MG tablet Take 1 tablet (30 mg total) by mouth 4 (four) times daily.   12/04/2014 at Unknown time  . omeprazole (PRILOSEC) 20 MG capsule Take 1 capsule (20 mg total) by mouth daily.   11/28/2014 at Unknown time  . Rivaroxaban (XARELTO) 15 MG TABS tablet Take 15 mg  by mouth 2 (two) times daily with a meal.   11/17/2014 at 0800  . vancomycin 1,500 mg in sodium chloride 0.9 % 500 mL Inject 1,500 mg into the vein daily.   12/01/2014 at 0400  . levalbuterol (XOPENEX) 0.63 MG/3ML nebulizer solution Take 3 mLs (0.63 mg total) by nebulization every 4 (four) hours as needed for wheezing or shortness of breath.   unknown at unknown time  . loperamide (IMODIUM) 2 MG capsule Take 1 capsule (2 mg total) by mouth as needed for diarrhea or loose stools. (Patient taking differently: Take 2 mg by mouth every 4 (four) hours as needed for diarrhea or loose stools. )   unknown at unknown time  . ondansetron (ZOFRAN) 4 MG tablet Take 1 tablet (4 mg total) by mouth every 6 (six) hours as needed for nausea or vomiting.   unknown at unknown time  . promethazine (PHENERGAN) 25 MG tablet Take 1 tablet (25 mg total) by mouth every 6 (six) hours as needed for nausea or vomiting.   unknown at unknown time  . traMADol (ULTRAM) 50 MG tablet Take 1 tablet (50 mg total) by mouth every 6 (six) hours as needed for moderate pain.   unknown at unknown time   Scheduled: . albuterol  2.5 mg Nebulization TID  . calcium carbonate  1,250 mg Oral TID  . carvedilol  9.375 mg Oral BID WC  . ceFEPime (MAXIPIME) IV  1 g Intravenous Q12H  . dexamethasone  2 mg Oral Daily  . escitalopram  10 mg Oral Daily  . insulin aspart  0-5 Units Subcutaneous QHS  . insulin aspart  0-9 Units Subcutaneous TID WC  . insulin aspart  3 Units Subcutaneous TID WC  . insulin glargine  10 Units Subcutaneous BID  . isosorbide-hydrALAZINE  1 tablet Oral BID  . pantoprazole  40 mg Oral Daily  . sodium chloride  3 mL Intravenous Q12H   Continuous: . furosemide (LASIX) infusion 8 mg/hr (11/28/14 0847)  . [START ON 11/26/2014] heparin     PIR:JJOACZ chloride, acetaminophen, clonazePAM, ondansetron **OR** ondansetron (ZOFRAN) IV, polyethylene glycol, promethazine, sodium chloride Anti-infectives    Start     Dose/Rate Route  Frequency Ordered Stop   11/28/14 1000  fluconazole (DIFLUCAN) tablet 100 mg  Status:  Discontinued     100 mg Oral Daily 12/02/2014 1815 11/28/14 0633   12/12/2014 2000  ceFEPIme (MAXIPIME) 1 g in dextrose 5 % 50 mL IVPB     1 g100 mL/hr over 30 Minutes Intravenous Every 12 hours 11/21/2014 1815  Results for orders placed or performed during the hospital encounter of 11/28/2014 (from the past 48 hour(s))  Brain natriuretic peptide     Status: Abnormal   Collection Time: 11/20/2014  7:00 PM  Result Value Ref Range   B Natriuretic Peptide 474.9 (H) 0.0 - 100.0 pg/mL    Comment: Please note change in reference range.  Creatinine, serum     Status: Abnormal   Collection Time: 12/05/2014  7:04 PM  Result Value Ref Range   Creatinine, Ser 2.84 (H) 0.50 - 1.35 mg/dL   GFR calc non Af Amer 22 (L) >90 mL/min   GFR calc Af Amer 26 (L) >90 mL/min    Comment: (NOTE) The eGFR has been calculated using the CKD EPI equation. This calculation has not been validated in all clinical situations. eGFR's persistently <90 mL/min signify possible Chronic Kidney Disease.   Troponin I     Status: Abnormal   Collection Time: 12/01/2014  7:04 PM  Result Value Ref Range   Troponin I 0.07 (H) <0.031 ng/mL    Comment:        PERSISTENTLY INCREASED TROPONIN VALUES IN THE RANGE OF 0.04-0.49 ng/mL CAN BE SEEN IN:       -UNSTABLE ANGINA       -CONGESTIVE HEART FAILURE       -MYOCARDITIS       -CHEST TRAUMA       -ARRYHTHMIAS       -LATE PRESENTING MYOCARDIAL INFARCTION       -COPD   CLINICAL FOLLOW-UP RECOMMENDED. Please note change in reference range.   Hemoglobin A1c     Status: Abnormal   Collection Time: 11/24/2014  7:04 PM  Result Value Ref Range   Hgb A1c MFr Bld 6.2 (H) <5.7 %    Comment: (NOTE)                                                                       According to the ADA Clinical Practice Recommendations for 2011, when HbA1c is used as a screening test:  >=6.5%   Diagnostic of Diabetes  [place]           (if abnormal result is confirmed) 5.7-6.4%   Increased risk of developing Diabetes Mellitus References:Diagnosis and Classification of Diabetes Mellitus,Diabetes [id number],8811,03(PRXYV 1):S62-S69 and Standards of Medical Care in         Diabetes - 2011,Diabetes OPFY,9244,62 (Suppl 1):S11-S61.    Mean Plasma Glucose 131 (H) <117 mg/dL    Comment: Performed at [facility]  CBC WITH DIFFERENTIAL     Status: Abnormal   Collection Time: 12/13/2014  7:04 PM  Result Value Ref Range   WBC 20.4 (H) 4.0 - 10.5 K/uL   RBC 3.32 (L) 4.22 - 5.81 MIL/uL   Hemoglobin 9.5 (L) 13.0 - 17.0 g/dL   HCT 29.7 (L) 39.0 - 52.0 %   MCV 89.5 78.0 - 100.0 fL   MCH 28.6 26.0 - 34.0 pg   MCHC 32.0 30.0 - 36.0 g/dL   RDW 22.0 (H) 11.5 - 15.5 %   Platelets 123 (L) 150 - 400 K/uL   Neutrophils Relative % 96 (H) 43 - 77 %   Lymphocytes Relative 1 (L) 12 - 46 %  Monocytes Relative 3 3 - 12 %   Eosinophils Relative 0 0 - 5 %   Basophils Relative 0 0 - 1 %   Neutro Abs 19.6 (H) 1.7 - 7.7 K/uL   Lymphs Abs 0.2 (L) 0.7 - 4.0 K/uL   Monocytes Absolute 0.6 0.1 - 1.0 K/uL   Eosinophils Absolute 0.0 0.0 - 0.7 K/uL   Basophils Absolute 0.0 0.0 - 0.1 K/uL   WBC Morphology TOXIC GRANULATION   Protime-INR     Status: Abnormal   Collection Time: 11/28/2014  7:04 PM  Result Value Ref Range   Prothrombin Time 32.7 (H) 11.6 - 15.2 seconds   INR 3.16 (H) 0.00 - 1.49  Glucose, capillary     Status: Abnormal   Collection Time: 12/11/2014 10:50 PM  Result Value Ref Range   Glucose-Capillary 321 (H) 70 - 99 mg/dL  Troponin I     Status: Abnormal   Collection Time: 11/28/14 12:00 AM  Result Value Ref Range   Troponin I 0.06 (H) <0.031 ng/mL    Comment:        PERSISTENTLY INCREASED TROPONIN VALUES IN THE RANGE OF 0.04-0.49 ng/mL CAN BE SEEN IN:       -UNSTABLE ANGINA       -CONGESTIVE HEART FAILURE       -MYOCARDITIS       -CHEST TRAUMA       -ARRYHTHMIAS       -LATE PRESENTING MYOCARDIAL  INFARCTION       -COPD   CLINICAL FOLLOW-UP RECOMMENDED. Please note change in reference range.   CBC     Status: Abnormal   Collection Time: 11/28/14  3:05 AM  Result Value Ref Range   WBC 16.8 (H) 4.0 - 10.5 K/uL   RBC 3.03 (L) 4.22 - 5.81 MIL/uL   Hemoglobin 8.6 (L) 13.0 - 17.0 g/dL   HCT 27.3 (L) 39.0 - 52.0 %   MCV 90.1 78.0 - 100.0 fL   MCH 28.4 26.0 - 34.0 pg   MCHC 31.5 30.0 - 36.0 g/dL   RDW 22.1 (H) 11.5 - 15.5 %   Platelets 118 (L) 150 - 400 K/uL    Comment: SPECIMEN CHECKED FOR CLOTS REPEATED TO VERIFY PLATELET COUNT CONFIRMED BY SMEAR   Comprehensive metabolic panel     Status: Abnormal   Collection Time: 11/28/14  3:05 AM  Result Value Ref Range   Sodium 139 135 - 145 mmol/L    Comment: Please note change in reference range.   Potassium 4.9 3.5 - 5.1 mmol/L    Comment: Please note change in reference range.   Chloride 103 96 - 112 mEq/L   CO2 27 19 - 32 mmol/L   Glucose, Bld 218 (H) 70 - 99 mg/dL   BUN 70 (H) 6 - 23 mg/dL   Creatinine, Ser 2.86 (H) 0.50 - 1.35 mg/dL   Calcium 7.5 (L) 8.4 - 10.5 mg/dL   Total Protein 5.2 (L) 6.0 - 8.3 g/dL   Albumin 1.5 (L) 3.5 - 5.2 g/dL   AST 28 0 - 37 U/L   ALT 15 0 - 53 U/L   Alkaline Phosphatase 66 39 - 117 U/L   Total Bilirubin 0.4 0.3 - 1.2 mg/dL   GFR calc non Af Amer 22 (L) >90 mL/min   GFR calc Af Amer 26 (L) >90 mL/min    Comment: (NOTE) The eGFR has been calculated using the CKD EPI equation. This calculation has not been validated in all clinical situations. eGFR's  persistently <90 mL/min signify possible Chronic Kidney Disease.    Anion gap 9 5 - 15  Vancomycin, trough     Status: Abnormal   Collection Time: 11/28/14  3:05 AM  Result Value Ref Range   Vancomycin Tr 29.6 (HH) 10.0 - 20.0 ug/mL    Comment: REPEATED TO VERIFY CRITICAL RESULT CALLED TO, READ BACK BY AND VERIFIED WITH: VERGEL DE DIOS,L/4E @0438  ON 11/28/14 BY KARCZEWSKI,S.   Troponin I     Status: Abnormal   Collection Time: 11/28/14   6:10 AM  Result Value Ref Range   Troponin I 0.06 (H) <0.031 ng/mL    Comment:        PERSISTENTLY INCREASED TROPONIN VALUES IN THE RANGE OF 0.04-0.49 ng/mL CAN BE SEEN IN:       -UNSTABLE ANGINA       -CONGESTIVE HEART FAILURE       -MYOCARDITIS       -CHEST TRAUMA       -ARRYHTHMIAS       -LATE PRESENTING MYOCARDIAL INFARCTION       -COPD   CLINICAL FOLLOW-UP RECOMMENDED. Please note change in reference range.   Glucose, capillary     Status: Abnormal   Collection Time: 11/28/14  7:45 AM  Result Value Ref Range   Glucose-Capillary 165 (H) 70 - 99 mg/dL   Comment 1 Notify RN    Comment 2 Documented in Chart   APTT     Status: Abnormal   Collection Time: 11/28/14 10:00 AM  Result Value Ref Range   aPTT 62 (H) 24 - 37 seconds    Comment:        IF BASELINE aPTT IS ELEVATED, SUGGEST PATIENT RISK ASSESSMENT BE USED TO DETERMINE APPROPRIATE ANTICOAGULANT THERAPY.   Sodium, urine, random     Status: None   Collection Time: 11/28/14 10:50 AM  Result Value Ref Range   Sodium, Ur 41 mmol/L    Comment: Performed at [facility]  Glucose, capillary     Status: Abnormal   Collection Time: 11/28/14 12:11 PM  Result Value Ref Range   Glucose-Capillary 126 (H) 70 - 99 mg/dL    Ct Chest Wo Contrast  11/26/2014   CLINICAL DATA:  Metastatic lung cancer. Evaluate for progression. Renal failure.  EXAM: CT CHEST WITHOUT CONTRAST  TECHNIQUE: Multidetector CT imaging of the chest was performed following the standard protocol without IV contrast.  COMPARISON:  10/24/2014  FINDINGS: Mediastinum/Nodes: Mild cardiomegaly. Trace pericardial fluid or thickening may be physiologic. Anemia, as evidenced by decreased density of the intravascular space.  A right Port-A-Cath new terminates at the low SVC. There is also a left PICC line which terminates at the low SVC.  Anasarca. Left gynecomastia. Left subpectoral node measures 1.2 cm on image 15 and is unchanged.  Aortic and branch vessel  atherosclerosis.  High right paratracheal node measures 1.4 cm today versus 1.1 cm on the prior. Precarinal node measures 1.5 cm on image 29 versus 9 mm on the prior.  Prevascular node measures 1.2 cm on image 27 versus 8 mm on the prior. Hilar regions poorly evaluated without intravenous contrast.  Retrocrural node is newly enlarged at 1.4 cm on image 56.  Lungs/Pleura: Minimal motion degradation. Fluid or secretions within the left mainstem bronchus and at the level of the carina.  Moderate centrilobular emphysema. Worsening right lower lobe aeration with collapse/ consolidative change.  A subpleural left lower lobe lung nodule measures 6 mm on image 43 and is new  or markedly enlarged from image 37 of the prior. Worsening left base atelectasis. The left lower lobe pulmonary nodule is partially obscured by atelectasis. Measures maximally 1.5 cm on image 45 today versus 1.7 cm on the prior.  Small to moderate right pleural effusion is minimally increased and demonstrates a suggestion of mild loculation inferiorly. Small left pleural effusion is slightly increased.  Upper abdomen: Probable periportal edema. Normal imaged portions of the spleen, stomach, right kidney. Upper abdominal adenopathy is difficult to evaluate secondary to lack of IV contrast and patient size. The celiac node measures 2.0 cm on image 63 and is similar to 1.9 cm on the prior. Perihepatic ascites, similar.  Musculoskeletal: Anterior right chest wall deformity could be developmental or postsurgical.  IMPRESSION: 1. Progression of thoracic adenopathy. 2. Worsened bibasilar aeration. On the left, likely atelectasis. On the right, this could represent atelectasis or infection. 3. Left base airspace disease partially obscures the left lower lobe pulmonary nodule, which is felt to be similar. A more anterior left lower lobe nodule is new or enlarged and suspicious for metastatic disease. 4. Slight increase in right greater than left pleural  effusions. 5. Grossly similar upper abdominal adenopathy, suboptimally evaluated. 6. Similar perihepatic ascites and trace pericardial fluid. 7. Left-sided gynecomastia. 8. Fluid or secretions in the endobronchial tree. Clinically exclude aspiration. 9. Anasarca and probable anemia.   Electronically Signed   By: Abigail Miyamoto M.D.   On: 12/09/2014 12:19   US Venous Img Lower Unilateral Left  12/14/2014   CLINICAL DATA:  Left lower leg swelling with weeping edema.  EXAM: LEFT LOWER EXTREMITY VENOUS DOPPLER ULTRASOUND  TECHNIQUE: Gray-scale sonography with graded compression, as well as color Doppler and duplex ultrasound, were performed to evaluate the deep venous system from the level of the common femoral vein through the popliteal and proximal calf veins. Spectral Doppler was utilized to evaluate flow at rest and with distal augmentation maneuvers.  COMPARISON:  11/06/2014  FINDINGS: Right common femoral vein is compressible and patent.  Normal compressibility, augmentation and color Doppler flow in the left common femoral vein, left femoral vein and left popliteal vein. Prominent lymph nodes in the left groin.  Again noted is an irregular fluid collection in the medial left popliteal fossa. This collection roughly measures 5.5 x 1.7 x 1.9 cm. Findings are similar to the previous examination and consistent with a Baker's cyst.  Subcutaneous edema throughout the left ankle. Again noted is an occluded and non compressible left peroneal vein. This finding is similar to the previous examination. Left great saphenous vein is difficult to visualize due to extensive subcutaneous edema. Posterior tibial veins appear to be patent.  IMPRESSION: Persistent deep vein thrombosis in the left peroneal vein. Minimal change from the previous examination. Deep vein thrombosis does not extend into the thigh.  Extensive subcutaneous edema throughout the left lower extremity.  Small Baker's cyst.   Electronically Signed   By: Markus Daft M.D.   On: 11/18/2014 12:06   Dg Chest Port 1 View  11/22/2014   CLINICAL DATA:  PICC placement.  Initial encounter.  EXAM: PORTABLE CHEST - 1 VIEW  COMPARISON:  Chest radiograph performed 11/23/2014, and CT of the chest performed earlier today at 11:42 a.m.  FINDINGS: The patient's left PICC is again noted ending about the mid to distal SVC. A right-sided chest port is also noted ending about the mid to distal SVC.  There is a moderate right-sided pleural effusion. Vascular congestion is noted. The left  lung base is incompletely imaged on this study. No pneumothorax is seen.  The cardiomediastinal silhouette is borderline normal in size. No acute osseous abnormalities are identified.  IMPRESSION: 1. Left PICC noted ending about the mid to distal SVC. 2. Moderate right-sided pleural effusion noted. Vascular congestion seen.   Electronically Signed   By: Garald Balding M.D.   On: 12/08/2014 22:23   Dg Foot Complete Right  12/10/2014   CLINICAL DATA:  Wheelchair ran over right foot, with right great toe discoloration and laceration. Initial encounter.  EXAM: RIGHT FOOT COMPLETE - 3+ VIEW  COMPARISON:  None.  FINDINGS: There is no definite evidence of osseous erosion to suggest osteomyelitis, though evaluation for osteomyelitis is limited on radiograph. There is no evidence of fracture or dislocation. There appears to be soft tissue irregularity at the right great toe. Visualized joint spaces are preserved. Mild diffuse soft tissue swelling is noted about the midfoot and hindfoot.  The subtalar joint is unremarkable in appearance. Scattered vascular calcifications are seen.  IMPRESSION: 1. No definite evidence of osseous erosion to suggest osteomyelitis, though evaluation for osteomyelitis is limited on radiograph. No evidence of fracture or dislocation. 2. Scattered vascular calcifications seen.   Electronically Signed   By: Garald Balding M.D.   On: 12/09/2014 23:24    Review of Systems   Constitutional: Positive for chills and malaise/fatigue. Negative for fever, weight loss and diaphoresis.  HENT: Negative.   Eyes: Negative.   Respiratory: Positive for shortness of breath (DOE).   Cardiovascular: Positive for leg swelling (chronic, it is much better now.).  Gastrointestinal: Positive for diarrhea. Negative for heartburn, nausea, vomiting, abdominal pain, constipation, blood in stool and melena.  Genitourinary: Negative.   Musculoskeletal: Negative.   Skin: Negative.   Neurological: Positive for weakness. Negative for dizziness, tingling, tremors, speech change, focal weakness, seizures and loss of consciousness.  Endo/Heme/Allergies: Negative.   Psychiatric/Behavioral: Positive for depression.   Blood pressure 158/70, pulse 87, temperature 98.1 F (36.7 C), temperature source Oral, resp. rate 20, height 6' 2"  (1.88 m), weight 95.1 kg (209 lb 10.5 oz), SpO2 100 %. Physical Exam  Constitutional: He is oriented to person, place, and time. He appears well-developed and well-nourished. No distress.  Ill appearing male, but in no distress.  HENT:  Head: Normocephalic and atraumatic.  Nose: Nose normal.  Eyes: Conjunctivae and EOM are normal. Right eye exhibits no discharge. Left eye exhibits no discharge. No scleral icterus.  Neck: Normal range of motion. Neck supple. No JVD present. No tracheal deviation present. No thyromegaly present.  Neck is sore and tender.  Cardiovascular: Normal rate, regular rhythm, normal heart sounds and intact distal pulses.   No murmur heard. Respiratory: Effort normal and breath sounds normal. No respiratory distress. He has no wheezes. He has no rales. He exhibits no tenderness.  Slightly tender over the port, tender and sore right neck.  He has pain with movement of the right shoulder and using right arm.  GI: Soft. Bowel sounds are normal. He exhibits no distension and no mass. There is no tenderness. There is no rebound and no guarding.   Musculoskeletal: He exhibits edema (Both lower legs are edematous.). He exhibits no tenderness.  His right shoulder is painful with any manipulation, and the shoulder is one of his main issues now.    Lymphadenopathy:    He has no cervical adenopathy.  Neurological: He is alert and oriented to person, place, and time. No cranial nerve deficit.  Skin: Skin  is warm and dry. No rash noted. He is not diaphoretic. No erythema. No pallor.  Psychiatric: He has a normal mood and affect. His behavior is normal. Judgment and thought content normal.    Assessment/Plan: 1.  MRSA positive blood cultures 2.  Infected porta cath 3.  Metastatic adenocarcinoma on chemotherapy, immunocompromised.   4.  Acute renal failure 5.  Type II diabetes 6.  Systolic CHF, EF 25-74% 7.  Hypertension 8.  Hx of tobacco use 9.  Anxiety and depression  Plan:  Dr. Olevia Bowens has ask [place] to remove the porta cath and I currently have him set up as the 2nd DOW case, for tomorrow.      Ralph [name] 11/28/2014, 2:18 PM

## 2014-11-28 NOTE — Progress Notes (Signed)
CRITICAL VALUE ALERT  Critical value received:  Vancomycin trough 29.6  Date of notification:  11/28/2013  Time of notification:  2574  Critical value read back:Yes.    Nurse who received alert:  L. Vergel de Larkin Ina RN  MD notified (1st page):  Lamar Blinks NP  Time of first page:  0502  MD notified (2nd page):  Time of second page:  Responding MD:  Lamar Blinks NP  Time MD responded:  [phone]

## 2014-11-28 NOTE — Progress Notes (Signed)
Received fax from [facility] regarding patient's recent admission.  Reviewed findings with pharmacist which showed patient had positive MRSA blood cultures from port and peripheral draw and urine.  MD paged with findings.  Infection control notified. Placed patient on contact precautions. Will continue to monitor.

## 2014-11-28 NOTE — Progress Notes (Signed)
Clinical Social Work Department BRIEF PSYCHOSOCIAL ASSESSMENT 11/28/2014  Patient:  Ralph [name], Ralph [name]     Account Number:  [id number]     Admit date:  12/14/2014  Clinical Social Worker:  Glorious Peach, CLINICAL SOCIAL WORKER  Date/Time:  11/28/2014 10:31 AM  Referred by:  Physician  Date Referred:  11/28/2014 Referred for  SNF Placement   Other Referral:   Interview type:  Family Other interview type:    PSYCHOSOCIAL DATA Living Status:  FACILITY Admitted from facility:  [facility] Level of care:  [place] Primary support name:  Kyjuan Gause Primary support relationship to patient:  SIBLING Degree of support available:   Adequate    CURRENT CONCERNS Current Concerns  Post-Acute Placement   Other Concerns:    SOCIAL WORK ASSESSMENT / PLAN CSW contacted [facility] to confirm patient living arrangement before admission to hospital. SNF confirmed patient was a resident before being admitted. CSW will continue to follow patient. CSW to fax over needed information when patient ready for DC.   Assessment/plan status:   Other assessment/ plan:   Information/referral to community resources:   CSW completed FL2, and spoke with [facility] who is agreeable with taking patient back when medically stable and ready for DC.    PATIENT'S/FAMILY'S RESPONSE TO PLAN OF CARE: CSW spoke with family at bedside, introduced self and explained role. Family members at bedside contacted patient's sister, Morrell Fluke in regards to plans after DC. CSW reviewed charts, verifying patient insurance. Patient has no insurance at the time but sister assured CSW that patient has a Medicaid application pending. Sister left CSW patients case worker's name and phone number- Dicky Doe [phone]. Patient agreeable to return to [place] when DC.       Glorious Peach BSW Intern

## 2014-11-28 NOTE — Progress Notes (Signed)
VASCULAR LAB PRELIMINARY  ARTERIAL  ABI completed:    RIGHT    LEFT    PRESSURE WAVEFORM  PRESSURE WAVEFORM  BRACHIAL The patient did not want blood pressure taken on this side triphasic BRACHIAL 151 triphasic  DP   DP    AT 85 Barely audible AT 113 monophasic  PT 79 Severely dampened monophasic PT 132 monophasic  PER   PER    GREAT TOE  absent GREAT TOE 40 NA    RIGHT LEFT  ABI 0.56 0.87     Jaylyn Iyer, RVT 11/28/2014, 11:15 AM
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
## 2014-11-28 NOTE — Progress Notes (Signed)
ANTIBIOTIC CONSULT NOTE   Pharmacy Consult for Vancomcyin Indication: Empiric coverage  No Known Allergies  Patient Measurements: Height: 6\' 2"  (188 cm) Weight: 209 lb 10.5 oz (95.1 kg) IBW/kg (Calculated) : 82.2  Vital Signs: Temp: 98.8 F (37.1 C) (01/12 0626) Temp Source: Oral (01/12 0626) BP: 142/74 mmHg (01/12 0626) Pulse Rate: 84 (01/12 0626) Intake/Output from previous day: 01/11 0701 - 01/12 0700 In: -  Out: 550 [Urine:550]  Labs:  Recent Labs  11/30/2014 0917 11/24/2014 1904 11/28/14 0305  WBC 16.2* 20.4* 16.8*  HGB 9.1* 9.5* 8.6*  PLT 130* 123* 118*  CREATININE 2.9* 2.84* 2.86*   Estimated Creatinine Clearance: 31.1 mL/min (by C-G formula based on Cr of 2.86).    Recent Labs  11/28/14 0305  VANCOTROUGH 29.6*     Microbiology: No results found for this or any previous visit (from the past 720 hour(s)).  Medical History: Past Medical History  Diagnosis Date  . Allergy   . Anxiety     followed Baker/Psychiatry every six months.  . Hypertension   . Diabetes mellitus without complication   . Metastatic lung carcinoma 09/16/2014    Medications:  Anti-infectives    Start     Dose/Rate Route Frequency Ordered Stop   11/28/14 1000  fluconazole (DIFLUCAN) tablet 100 mg  Status:  Discontinued     100 mg Oral Daily 11/18/2014 1815 11/28/14 0633   12/10/2014 2000  ceFEPIme (MAXIPIME) 1 g in dextrose 5 % 50 mL IVPB     1 g100 mL/hr over 30 Minutes Intravenous Every 12 hours 11/18/2014 1815       Assessment: 48 yoM admitted 1/11 with progressive failure to thrive, LE edema, SOB.  PMH includes end stage CHF (EF 25%), metastatic lung cancer, DM, anxiety, depression, DVT (currently on Xarelto), and worsening renal fxn.  Empiric broad spectrum antibiotics are started with concern for bacteremia, diabetic foot infection, and/or PAC/PICC line infections.  Pharmacy is consulted to continue dosing vancomycin.  [facility] MAG Records of PTA  antibiotics: Fluconazole 100mg  daily (per MAG, no stop date, prophylactic) Completed 10 day course of Levaquin 750mg  daily x10 days on 1/8 Vancomycin 1500mg  IV q24h started 1/9 with last dose 1/11 at 0400  (PTA) 1/11 >> Vanc >> 1/11 >> Cefepime >>  1/11 >> Fluconazole >>   Tmax: afebrile WBCs: 16.8 Renal: SCr 2.86, CrCl ~ 31 ml/min CG (~ 27N)  1/11 blood x2: ordered  Drug level / dose changes info: 1/11 0300 Vanc level = 29.6 mcg/mg before 4th dose on 1500mg  q24h per outpt records.  Goal of Therapy:  Vancomycin trough level 15-20 mcg/ml  Plan:   Vancomycin trough elevated this am, on vancomycin prior to hospital admission  Repeat vancomycin level in am  Follow up renal fxn and culture results as available.  Doreene Eland, PharmD, BCPS.   Pager: [phone] 11/28/2014 7:02 AM

## 2014-11-28 NOTE — Consult Note (Signed)
WOC wound consult note Reason for Consult:Right great toe with eschar, suspect dry gangrene. Patient states that toe has had this appearance for approximately two weeks. Small open area on right lateral foot (partial thickness) and left pretibial area. Edema of LLE is >Right.  Elevation and basic skin care is already in place to that extremity. Wound type: infectious vs arterial insufficiency Pressure Ulcer POA: Yes/No Measurement:entire RGT  Wound bed:Dry, hard eschar. Drainage (amount, consistency, odor) serous exudate from lateral right foot ulcer measuring 1cm x 1.5cm x 0.2cm Periwound: edematous, but left LE is > than right LE. Dressing procedure/placement/frequency:I will implement a conservative POC that includes a dry dressing with antimicrobial properties and bilateral Prevalon Boots for pressure redistribution and protection.  MD may wish to consult with orthopedics or VVS to determine viability of digit.   [place] nursing team will not follow, but will remain available to this patient, the nursing and medical teams.  Please re-consult if needed. Thanks, Maudie Flakes, MSN, RN, [place], [place], [place] [phone])

## 2014-11-28 NOTE — Progress Notes (Signed)
Patient ID: Ralph [name], male   DOB: 1951/07/16, 64 y.o.   MRN: [id number]         [facility]    Date of Admission:  11/28/2014     [name] has MRSA bacteremia and is being mangaged correctly. I will see him in the morning and provide a complete consultation.  Michel Bickers, MD [facility] for [facility] [phone] pager   [phone] cell 11/28/2014, 5:30 PM

## 2014-11-28 NOTE — Progress Notes (Signed)
Patient's right chest Port-a-cath and left arm PICC are being suspected for possible infection according to the patient. IV team came to reasess. Unable to place peripheral IV on right arm because patient claimed that somebody mentioned to him that he might have clot on that arm. Tylene Fantasia NP was made aware. IV team said it's ok to start peripheral IV on left arm since the PICC line is not currently in use. IV lasix drip and maxipime just given now after an IV was placed, Tylene Fantasia NP and pharmacy was made aware.

## 2014-11-29 ENCOUNTER — Inpatient Hospital Stay (HOSPITAL_COMMUNITY): Payer: Medicaid Other | Admitting: Certified Registered Nurse Anesthetist

## 2014-11-29 ENCOUNTER — Encounter: Payer: Self-pay | Admitting: Physician Assistant

## 2014-11-29 ENCOUNTER — Encounter (HOSPITAL_COMMUNITY): Admission: AD | Disposition: E | Payer: Self-pay | Source: Ambulatory Visit | Attending: Internal Medicine

## 2014-11-29 DIAGNOSIS — F419 Anxiety disorder, unspecified: Secondary | ICD-10-CM | POA: Diagnosis present

## 2014-11-29 DIAGNOSIS — R7881 Bacteremia: Secondary | ICD-10-CM | POA: Diagnosis present

## 2014-11-29 DIAGNOSIS — F329 Major depressive disorder, single episode, unspecified: Secondary | ICD-10-CM | POA: Diagnosis present

## 2014-11-29 DIAGNOSIS — R609 Edema, unspecified: Secondary | ICD-10-CM

## 2014-11-29 DIAGNOSIS — F17210 Nicotine dependence, cigarettes, uncomplicated: Secondary | ICD-10-CM | POA: Diagnosis present

## 2014-11-29 DIAGNOSIS — B9562 Methicillin resistant Staphylococcus aureus infection as the cause of diseases classified elsewhere: Secondary | ICD-10-CM

## 2014-11-29 DIAGNOSIS — I509 Heart failure, unspecified: Secondary | ICD-10-CM

## 2014-11-29 DIAGNOSIS — F32A Depression, unspecified: Secondary | ICD-10-CM | POA: Diagnosis present

## 2014-11-29 DIAGNOSIS — D696 Thrombocytopenia, unspecified: Secondary | ICD-10-CM | POA: Diagnosis present

## 2014-11-29 DIAGNOSIS — J449 Chronic obstructive pulmonary disease, unspecified: Secondary | ICD-10-CM | POA: Diagnosis present

## 2014-11-29 DIAGNOSIS — D649 Anemia, unspecified: Secondary | ICD-10-CM | POA: Diagnosis present

## 2014-11-29 DIAGNOSIS — T80219A Unspecified infection due to central venous catheter, initial encounter: Secondary | ICD-10-CM

## 2014-11-29 HISTORY — PX: PORT-A-CATH REMOVAL: SHX5289

## 2014-11-29 LAB — BASIC METABOLIC PANEL
Anion gap: 9 (ref 5–15)
BUN: 67 mg/dL — ABNORMAL HIGH (ref 6–23)
CO2: 26 mmol/L (ref 19–32)
Calcium: 7.5 mg/dL — ABNORMAL LOW (ref 8.4–10.5)
Chloride: 104 mEq/L (ref 96–112)
Creatinine, Ser: 2.72 mg/dL — ABNORMAL HIGH (ref 0.50–1.35)
GFR, EST AFRICAN AMERICAN: 27 mL/min — AB (ref >90)
GFR, EST NON AFRICAN AMERICAN: 23 mL/min — AB (ref >90)
GLUCOSE: 121 mg/dL — AB (ref 70–99)
POTASSIUM: 4.8 mmol/L (ref 3.5–5.1)
SODIUM: 139 mmol/L (ref 135–145)

## 2014-11-29 LAB — CBC
HCT: 26.6 % — ABNORMAL LOW (ref 39.0–52.0)
Hemoglobin: 8.4 g/dL — ABNORMAL LOW (ref 13.0–17.0)
MCH: 28.6 pg (ref 26.0–34.0)
MCHC: 31.6 g/dL (ref 30.0–36.0)
MCV: 90.5 fL (ref 78.0–100.0)
PLATELETS: 105 10*3/uL — AB (ref 150–400)
RBC: 2.94 MIL/uL — ABNORMAL LOW (ref 4.22–5.81)
RDW: 22 % — ABNORMAL HIGH (ref 11.5–15.5)
WBC: 15.9 10*3/uL — ABNORMAL HIGH (ref 4.0–10.5)

## 2014-11-29 LAB — GLUCOSE, CAPILLARY
GLUCOSE-CAPILLARY: 84 mg/dL (ref 70–99)
GLUCOSE-CAPILLARY: 93 mg/dL (ref 70–99)
GLUCOSE-CAPILLARY: 94 mg/dL (ref 70–99)
Glucose-Capillary: 60 mg/dL — ABNORMAL LOW (ref 70–99)
Glucose-Capillary: 50 mg/dL — ABNORMAL LOW (ref 70–99)
Glucose-Capillary: 66 mg/dL — ABNORMAL LOW (ref 70–99)
Glucose-Capillary: 78 mg/dL (ref 70–99)

## 2014-11-29 LAB — MRSA PCR SCREENING: MRSA by PCR: POSITIVE — AB

## 2014-11-29 LAB — VANCOMYCIN, RANDOM: Vancomycin Rm: 21.4 ug/mL

## 2014-11-29 SURGERY — REMOVAL PORT-A-CATH
Anesthesia: Monitor Anesthesia Care

## 2014-11-29 MED ORDER — SODIUM CHLORIDE 0.9 % IJ SOLN
10.0000 mL | INTRAMUSCULAR | Status: DC | PRN
  Administered 2014-11-30 – 2014-12-03 (×5): 10 mL
  Filled 2014-11-29 (×5): qty 40

## 2014-11-29 MED ORDER — MIDAZOLAM HCL 5 MG/5ML IJ SOLN
INTRAMUSCULAR | Status: DC | PRN
  Administered 2014-11-29: 2 mg via INTRAVENOUS

## 2014-11-29 MED ORDER — PROPOFOL 10 MG/ML IV BOLUS
INTRAVENOUS | Status: AC
  Filled 2014-11-29: qty 20

## 2014-11-29 MED ORDER — BUPIVACAINE-EPINEPHRINE 0.25% -1:200000 IJ SOLN
INTRAMUSCULAR | Status: DC | PRN
  Administered 2014-11-29: 50 mL

## 2014-11-29 MED ORDER — PROPOFOL 10 MG/ML IV BOLUS
INTRAVENOUS | Status: DC | PRN
  Administered 2014-11-29 (×2): 20 mg via INTRAVENOUS

## 2014-11-29 MED ORDER — BUPIVACAINE-EPINEPHRINE 0.25% -1:200000 IJ SOLN
INTRAMUSCULAR | Status: AC
  Filled 2014-11-29: qty 1

## 2014-11-29 MED ORDER — FENTANYL CITRATE 0.05 MG/ML IJ SOLN: 25.0000 ug | INTRAMUSCULAR | Status: DC | PRN

## 2014-11-29 MED ORDER — PROPOFOL INFUSION 10 MG/ML OPTIME
INTRAVENOUS | Status: DC | PRN
  Administered 2014-11-29: 75 ug/kg/min via INTRAVENOUS

## 2014-11-29 MED ORDER — VITAMINS A & D EX OINT
TOPICAL_OINTMENT | CUTANEOUS | Status: AC
  Administered 2014-11-29: 5
  Filled 2014-11-29: qty 5

## 2014-11-29 MED ORDER — ONDANSETRON HCL 4 MG/2ML IJ SOLN: 4.0000 mg | Freq: Once | INTRAMUSCULAR | Status: DC | PRN

## 2014-11-29 MED ORDER — MIDAZOLAM HCL 2 MG/2ML IJ SOLN
INTRAMUSCULAR | Status: AC
  Filled 2014-11-29: qty 2

## 2014-11-29 MED ORDER — MUPIROCIN 2 % EX OINT
1.0000 "application " | TOPICAL_OINTMENT | Freq: Two times a day (BID) | CUTANEOUS | Status: AC
  Administered 2014-11-29 – 2014-12-03 (×10): 1 via NASAL
  Filled 2014-11-29 (×2): qty 22

## 2014-11-29 MED ORDER — LACTATED RINGERS IV SOLN
INTRAVENOUS | Status: DC
  Administered 2014-11-29: 1000 mL via INTRAVENOUS

## 2014-11-29 MED ORDER — VANCOMYCIN HCL IN DEXTROSE 1-5 GM/200ML-% IV SOLN
1000.0000 mg | INTRAVENOUS | Status: DC
  Administered 2014-11-29 – 2014-12-01 (×3): 1000 mg via INTRAVENOUS
  Filled 2014-11-29 (×4): qty 200

## 2014-11-29 MED ORDER — PHENYLEPHRINE 40 MCG/ML (10ML) SYRINGE FOR IV PUSH (FOR BLOOD PRESSURE SUPPORT)
PREFILLED_SYRINGE | INTRAVENOUS | Status: AC
  Filled 2014-11-29: qty 10

## 2014-11-29 MED ORDER — FUROSEMIDE 10 MG/ML IJ SOLN
20.0000 mg | Freq: Every day | INTRAMUSCULAR | Status: AC
  Administered 2014-11-29 – 2014-12-01 (×3): 20 mg via INTRAVENOUS
  Filled 2014-11-29 (×5): qty 2

## 2014-11-29 MED ORDER — CHLORHEXIDINE GLUCONATE CLOTH 2 % EX PADS
6.0000 | MEDICATED_PAD | Freq: Every day | CUTANEOUS | Status: AC
  Administered 2014-11-29 – 2014-12-03 (×4): 6 via TOPICAL

## 2014-11-29 MED ORDER — DEXTROSE 50 % IV SOLN
INTRAVENOUS | Status: AC
  Administered 2014-11-29: 25 mL
  Filled 2014-11-29: qty 50

## 2014-11-29 MED ORDER — LACTATED RINGERS IV SOLN
INTRAVENOUS | Status: DC | PRN
  Administered 2014-11-29: 14:00:00 via INTRAVENOUS

## 2014-11-29 MED ORDER — FENTANYL CITRATE 0.05 MG/ML IJ SOLN
INTRAMUSCULAR | Status: DC | PRN
  Administered 2014-11-29: 25 ug via INTRAVENOUS

## 2014-11-29 MED ORDER — PHENYLEPHRINE HCL 10 MG/ML IJ SOLN
INTRAMUSCULAR | Status: DC | PRN
  Administered 2014-11-29: 40 ug via INTRAVENOUS

## 2014-11-29 MED ORDER — FENTANYL CITRATE 0.05 MG/ML IJ SOLN
INTRAMUSCULAR | Status: AC
  Filled 2014-11-29: qty 2

## 2014-11-29 MED ORDER — 0.9 % SODIUM CHLORIDE (POUR BTL) OPTIME
TOPICAL | Status: DC | PRN
  Administered 2014-11-29: 1000 mL

## 2014-11-29 SURGICAL SUPPLY — 28 items
BANDAGE ELASTIC 6 VELCRO ST LF (GAUZE/BANDAGES/DRESSINGS) IMPLANT
BLADE HEX COATED 2.75 (ELECTRODE) ×3 IMPLANT
BLADE SURG 15 STRL LF DISP TIS (BLADE) ×1 IMPLANT
BLADE SURG 15 STRL SS (BLADE) ×2
CHLORAPREP W/TINT 26ML (MISCELLANEOUS) ×3 IMPLANT
DECANTER SPIKE VIAL GLASS SM (MISCELLANEOUS) ×3 IMPLANT
DRAPE LAPAROTOMY TRNSV 102X78 (DRAPE) ×3 IMPLANT
DRSG TEGADERM 2-3/8X2-3/4 SM (GAUZE/BANDAGES/DRESSINGS) IMPLANT
DRSG TEGADERM 4X4.75 (GAUZE/BANDAGES/DRESSINGS) IMPLANT
ELECT REM PT RETURN 9FT ADLT (ELECTROSURGICAL) ×3
ELECTRODE REM PT RTRN 9FT ADLT (ELECTROSURGICAL) ×1 IMPLANT
GAUZE IODOFORM PACK 1/2 7832 (GAUZE/BANDAGES/DRESSINGS) ×3 IMPLANT
GAUZE SPONGE 2X2 8PLY STRL LF (GAUZE/BANDAGES/DRESSINGS) IMPLANT
GAUZE SPONGE 4X4 12PLY STRL (GAUZE/BANDAGES/DRESSINGS) IMPLANT
GLOVE ECLIPSE 8.0 STRL XLNG CF (GLOVE) ×3 IMPLANT
GLOVE INDICATOR 8.0 STRL GRN (GLOVE) ×3 IMPLANT
GOWN STRL REUS W/TWL XL LVL3 (GOWN DISPOSABLE) ×6 IMPLANT
KIT BASIN OR (CUSTOM PROCEDURE TRAY) ×3 IMPLANT
NEEDLE HYPO 22GX1.5 SAFETY (NEEDLE) ×3 IMPLANT
PACK BASIC VI WITH GOWN DISP (CUSTOM PROCEDURE TRAY) ×3 IMPLANT
PENCIL BUTTON HOLSTER BLD 10FT (ELECTRODE) ×3 IMPLANT
SPONGE GAUZE 2X2 STER 10/PKG (GAUZE/BANDAGES/DRESSINGS)
SPONGE LAP 4X18 X RAY DECT (DISPOSABLE) ×3 IMPLANT
SYR 20CC LL (SYRINGE) ×3 IMPLANT
SYR BULB IRRIGATION 50ML (SYRINGE) ×3 IMPLANT
TOWEL OR 17X26 10 PK STRL BLUE (TOWEL DISPOSABLE) ×3 IMPLANT
TOWEL OR NON WOVEN STRL DISP B (DISPOSABLE) ×3 IMPLANT
YANKAUER SUCT BULB TIP 10FT TU (MISCELLANEOUS) ×3 IMPLANT

## 2014-11-29 NOTE — Progress Notes (Signed)
Peripherally Inserted Central Catheter/Midline Placement  The IV Nurse has discussed with the patient and/or persons authorized to consent for the patient, the purpose of this procedure and the potential benefits and risks involved with this procedure.  The benefits include less needle sticks, lab draws from the catheter and patient may be discharged home with the catheter.  Risks include, but not limited to, infection, bleeding, blood clot (thrombus formation), and puncture of an artery; nerve damage and irregular heat beat.  Alternatives to this procedure were also discussed.  PICC/Midline Placement Documentation        Ralph [name] 11/28/2014, 7:00 PM

## 2014-11-29 NOTE — Progress Notes (Signed)
Clinical Social Work Department BRIEF PSYCHOSOCIAL ASSESSMENT 12/16/2014  Patient:  Ralph [name], Ralph [name]     Account Number:  [id number]     Admit date:  12/13/2014  Clinical Social Worker:  Glorious Peach, CLINICAL SOCIAL WORKER  Date/Time:  11/28/2014 10:31 AM  Referred by:  Physician  Date Referred:  11/28/2014 Referred for  SNF Placement   Other Referral:   Interview type:  Family Other interview type:    PSYCHOSOCIAL DATA Living Status:  FACILITY Admitted from facility:  [place] Level of care:  [place] Primary support name:  Jessie Schrieber Primary support relationship to patient:  SIBLING Degree of support available:   Adequate    CURRENT CONCERNS Current Concerns  Post-Acute Placement   Other Concerns:    SOCIAL WORK ASSESSMENT / PLAN CSW contacted [place] to confirm patient living arrangement before admission to hospital. SNF confirmed patient was a resident before being admitted. CSW will continue to follow patient. CSW to fax over needed information when patient ready for DC.   Assessment/plan status:   Other assessment/ plan:   Information/referral to community resources:   CSW completed FL2, and spoke with [facility] who is agreeable with taking patient back when medically stable and ready for DC.    PATIENT'S/FAMILY'S RESPONSE TO PLAN OF CARE: CSW spoke with family at bedside, introduced self and explained role. Family members at bedside contacted patient's sister, Shareef Eddinger in regards to plans after DC. CSW reviewed charts, verifying patient insurance. Patient has no insurance at the time but sister assured CSW that patient has a Medicaid application pending. Sister left CSW patients case worker's name and phone number- Dicky Doe [phone]. Patient agreeable to return to [place] when DC.         Raynaldo Opitz, [facility] Clinical  Social Worker cell #: [phone]

## 2014-11-29 NOTE — Anesthesia Preprocedure Evaluation (Addendum)
Anesthesia Evaluation  Patient identified by MRN, date of birth, ID band Patient awake    Reviewed: Allergy & Precautions, NPO status , Patient's Chart, lab work & pertinent test results  Airway Mallampati: II  TM Distance: >3 FB Neck ROM: Full    Dental  (+) Poor Dentition   Pulmonary COPDCurrent Smoker,  Bilateral pleural effusions L>R         Cardiovascular hypertension, + Peripheral Vascular Disease and +CHF (Acute systolic HF- EF 91%. Small pericardial effusion on 1/13 Echo.)     Neuro/Psych Anxiety Depression negative neurological ROS     GI/Hepatic negative GI ROS, Neg liver ROS,   Endo/Other  diabetes, Type 2, Insulin Dependent  Renal/GU ARF and CRFRenal disease     Musculoskeletal   Abdominal   Peds  Hematology  (+) anemia , Thrombocytopenia   Anesthesia Other Findings   Reproductive/Obstetrics                            Anesthesia Physical Anesthesia Plan  ASA: IV  Anesthesia Plan: MAC   Post-op Pain Management:    Induction: Intravenous  Airway Management Planned: Natural Airway and Simple Face Mask  Additional Equipment:   Intra-op Plan:   Post-operative Plan:   Informed Consent: I have reviewed the patients History and Physical, chart, labs and discussed the procedure including the risks, benefits and alternatives for the proposed anesthesia with the patient or authorized representative who has indicated his/her understanding and acceptance.     Plan Discussed with: CRNA  Anesthesia Plan Comments:        Anesthesia Quick Evaluation

## 2014-11-29 NOTE — Op Note (Signed)
11/28/2014 - 12/09/2014  3:00 PM  PATIENT:  Ralph [name]  64 y.o. male  Patient Care Team: No Pcp Per Patient as PCP - General (General Practice) Volanda Napoleon, MD as Consulting Physician (Oncology)  PRE-OPERATIVE DIAGNOSIS:  Infected portacatheter right IJ  POST-OPERATIVE DIAGNOSIS: Infected portacatheter right IJ  PROCEDURE:  REMOVAL PORT-A-CATH  SURGEON:  Surgeon(s): Michael Boston, MD  ASSISTANT: RN   ANESTHESIA:   local and IV sedation  EBL:  Total I/O In: 400 [I.V.:400] Out: 300 [Urine:300]  Delay start of Pharmacological VTE agent (>24hrs) due to surgical blood loss or risk of bleeding:  no  DRAINS: none   SPECIMEN:  No Specimen  DISPOSITION OF SPECIMEN:  N/A  COUNTS:  YES  PLAN OF CARE: Admit for overnight observation  PATIENT DISPOSITION:  PACU - hemodynamically stable.  INDICATION: Gentleman with metastatic lung cancer.  Undergoing chemotherapy for portacatheter.  Had decline.  Evidence of positive blood cultures and worsening pain and tenderness and drainage around his port.  Recommendation made for removal Port-A-Cath as possible source or at least contributor to his infection/decline:  I recommended surgery to remove the catheter.  I explained the technique of removal with use of local anesthesia & possible need for more aggressive sedation/anesthesia for patient comfort.    Risks such as bleeding, infection, and other risks were discussed.  Post-operative dressing/incision care was discussed.  I noted a good likelihood this will help address the problem.   We will work to minimize complications. Questions were answered.  The patient expresses understanding & wishes to proceed with surgery.  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  Tracking up to neck as well.  DESCRIPTION:   The technique risks benefits and alternatives of portacatheter removal was discussed.  Questions answered.   The patient agreed to proceed.  The patient was positioned supine.  Patient underwent monitored deep sedation with anesthesia.   The chest was prepped and draped in a sterile fashion.  I placed a field block of local anesthetic.  The patient tolerated it well.  I made a transverse incision over the prior scar of the portacatheter.  I was able to sharply encounter the SQ tissue pocket around the portacatheter.  I immediately encountered a 5x4cm pocket rather slimy material with purulence and blood consistent with hematoma necrosis and purulence.  Because the patient had positive blood cultures, I did not send cultures.  The Port-A-Cath was floating in the middle of this.  I removed the Port-A-Cath and the catheter itself.  I debrided out all infected & necrotic tissue bluntly and sharply with a scalpel.  The anterior pectoralis fascia was viable.  No evidence of fasciitis.  We could see irrigated.  I packed the 5x5x3cm wound in the inferior chest wound using most of a bottle of half-inch iodoform NU Gauze.  Made a counterincision at his jugular puncture site as well as as there was purulence.  Left a 1 x 1 cm wound.  Removed some fibrin sleeve for the old port used to be.  Packed that as well with NU Gauze.   Sterile dressing applied.  The patient tolerated the procedure well.  Instructions are written.  No family available right now.  We will follow-up.   Adin Hector, M.D., F.A.C.S. Gastrointestinal and Minimally Invasive Surgery [facility] [address] [place], [place] [address] [phone] Main / Paging

## 2014-11-29 NOTE — Progress Notes (Signed)
In PACU, patient's CBG was reported as 50. Patient received orange juice and sprite in PACU. When patient arrived to the floor, CBG was checked which was 94. Patient has had two hypoglycemic events today due to NPO status. Post procedure, patient is now on a clear liquid diet.  Will continue to monitor CBGs.

## 2014-11-29 NOTE — Progress Notes (Signed)
Pt's blood sugar low; Dr. Ola Spurr notified and will give pt po fluids

## 2014-11-29 NOTE — Transfer of Care (Signed)
Immediate Anesthesia Transfer of Care Note  Patient: Ralph [name]  Procedure(s) Performed: Procedure(s) (LRB): REMOVAL PORT-A-CATH (N/A)  Patient Location: PACU  Anesthesia Type: MAC  Level of Consciousness: sedated, patient cooperative and responds to stimulation  Airway & Oxygen Therapy: Patient Spontanous Breathing and Patient connected to face mask oxgen  Post-op Assessment: Report given to PACU RN and Post -op Vital signs reviewed and stable  Post vital signs: Reviewed and stable  Complications: No apparent anesthesia complications

## 2014-11-29 NOTE — Progress Notes (Signed)
ANTICOAGULATION CONSULT NOTE  Pharmacy Consult for heparin Indication: history of DVT  No Known Allergies  Patient Measurements: Height: _0  (188 cm) Weight: 211 lb 3.2 oz (95.8 kg) IBW/kg (Calculated) : 82.2 Heparin Dosing Weight: 95kg  Vital Signs: Temp: 98 F (36.7 C) (01/13 1515) Temp Source: Oral (01/13 1135) BP: 144/66 mmHg (01/13 1515) Pulse Rate: 86 (01/13 1515)  Labs:  Recent Labs  11/28/2014 1904 11/28/14 11/28/14 0305 11/28/14 0610 11/28/14 1000 11/22/2014 0355  HGB 9.5*  --  8.6*  --   --  8.4*  HCT 29.7*  --  27.3*  --   --  26.6*  PLT 123*  --  118*  --   --  105*  APTT  --   --   --   --  62*  --   LABPROT 32.7*  --   --   --   --   --   INR 3.16*  --   --   --   --   --   CREATININE 2.84*  --  2.86*  --   --  2.72*  TROPONINI 0.07* 0.06*  --  0.06*  --   --     Estimated Creatinine Clearance: 32.7 mL/min (by C-G formula based on Cr of 2.72).   Medical History: Past Medical History  Diagnosis Date  . Allergy   . Anxiety     followed [facility] every six months.  . Hypertension   . Diabetes mellitus without complication   . 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 He has been on xarelto at [facility].  There is a note from 12/22 that xarelto was being started for DVT.  Looks like rehab re-started the 39m BID x 21 days from 11/20/14 (to change to 264mdaily 1/26).    Last dose of xarelto 1/12 8am  Today, 12/06/2014   CBC: Hgb = 8.6 (stable from previous values), pltc = 118 and trending down  Baseline aPTT elevated at 62  SCr = 2.86 (worsening), CrCL ~3144min, on lasix gtt with orders to increase  Removal of chemo Port for suspected line infection at 1400; nurse reports some bleeding from surgical site that has soaked through several layers of gauze  PICC line inserted around 1900 today  Discussed heparin start with MD who would like to  hold off on starting heparin until tomorrow AM  Goal of Therapy:  Heparin level 0.3-0.7 units/ml Monitor platelets by anticoagulation protocol: Yes   Plan:   Continue to hold heparin; f/u appropriateness to restart tomorrow AM  Suggest starting heparin gtt at 1500 units/hr w/o bolus  Expect xarelto's lingering effects to influence heparin level (anti-Xa), suggest using aPTT to help guide therapy (aptt goal 66-102 sec)  Daily CBC; HL as indicated  Monitor for signs of continued bleeding   DreReuel BoomharmD Pager: [phone]/2016, 10:36 PM
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
## 2014-11-29 NOTE — Progress Notes (Signed)
MRSA PCR positive. Patient already on contact precautions due to MRSA positive blood cultures from prior admission from 11/24/14. Bactroban and CHG wipes ordered per protocol.

## 2014-11-29 NOTE — Telephone Encounter (Signed)
I don't see any appts on this patient's chart. FYI.

## 2014-11-29 NOTE — Progress Notes (Signed)
Patient has had 3 loose BMs within the past 24 hours. Per protocol, patient is placed on Enteric Precautions and C.diff PCR obtained.  Will continue to monitor.

## 2014-11-29 NOTE — Progress Notes (Signed)
Right upper extremity venous duplex completed:  No evidence of DVT or superficial thrombosis.

## 2014-11-29 NOTE — Progress Notes (Signed)
ANTIBIOTIC CONSULT NOTE - FOLLOW UP  Pharmacy Consult for Vancomycin Indication: Diabetic foot infection  No Known Allergies  Patient Measurements: Height: 6\' 2"  (188 cm) Weight: 211 lb 3.2 oz (95.8 kg) IBW/kg (Calculated) : 82.2 Adjusted Body Weight:   Vital Signs: Temp: 98 F (36.7 C) (01/13 0459) Temp Source: Oral (01/13 0459) BP: 160/76 mmHg (01/13 0459) Pulse Rate: 93 (01/13 0459) Intake/Output from previous day: 01/12 0701 - 01/13 0700 In: 547.8 [P.O.:240; I.V.:207.8; IV Piggyback:100] Out: 1077 [Urine:1075; Stool:2] Intake/Output from this shift:    Labs:  Recent Labs  11/25/2014 1904 11/28/14 0305 12/05/2014 0355  WBC 20.4* 16.8* 15.9*  HGB 9.5* 8.6* 8.4*  PLT 123* 118* 105*  CREATININE 2.84* 2.86* 2.72*   Estimated Creatinine Clearance: 32.7 mL/min (by C-G formula based on Cr of 2.72).  Recent Labs  11/28/14 0305 11/23/2014 0355  VANCOTROUGH 29.6*  --   VANCORANDOM  --  21.4     Microbiology: No results found for this or any previous visit (from the past 720 hour(s)).  Anti-infectives    Start     Dose/Rate Route Frequency Ordered Stop   11/26/2014 1200  vancomycin (VANCOCIN) IVPB 1000 mg/200 mL premix     1,000 mg200 mL/hr over 60 Minutes Intravenous Every 24 hours 11/25/2014 0703     11/28/14 1000  fluconazole (DIFLUCAN) tablet 100 mg  Status:  Discontinued     100 mg Oral Daily 11/20/2014 1815 11/28/14 0633   11/28/2014 2000  ceFEPIme (MAXIPIME) 1 g in dextrose 5 % 50 mL IVPB     1 g100 mL/hr over 30 Minutes Intravenous Every 12 hours 12/02/2014 1815        Assessment: Patient with vancomycin level almost at goal with AM labs.  Will restart vancomycin at 1200 with lower dose.  Goal of Therapy:  Vancomycin trough level 15-20 mcg/ml  Plan:  Measure antibiotic drug levels at steady state Follow up culture results . Vancomycin 1gm iv q24hr  Nani Skillern Crowford 11/17/2014,7:03 AM

## 2014-11-29 NOTE — Progress Notes (Addendum)
[facility] PROGRESS NOTE  DENALI BECVAR EXB:[id number] DOB: 11/22/1950 DOA: 11/22/2014 PCP: No PCP Per Patient  Summary/Subjective: I have seen and examined Mr Bloom at bedisde and reviewed his chart. Appreciate Gen surgery/ID/Oncology. GUMECINDO HOPKIN is a pleasant 64 y.o. male with past medical history of diabetes mellitus type 2, anxiety and depression, systolic heart failure with an EF of 25% back in October 2015,metastatic lung cancer on chemotherapy(now discontinued), who came in for generalized weakness and was found to have MRSA bacteremia(BC on 11/24/13). There is concern for port-cath/picc line infection, hence plans for removal of both today. He has been on Lasix drip for lower extremity edema but renal function remains significantly off. Will d/c lasix drip and place him on iv lasix for now. Will continue antibiotics per ID. Patient was on Xarelto for DVT prior to admission. I noted plans for Iv heparin drip for now. He complaints of right arm swelling/pain. Will obtain venous doppler of the same as he may need venous access on that side. Patient complaints of insomnia but he says clonazepam helps. Plan MRSA bacteremia/Right great toe ulcer/ Diabetic foot ulcer associated with type 2 diabetes mellitus  On Vancomycin per phatmacy  Defer antibiotic management to ID  To have port-cath/Picc line removed and have another Picc line placed for prolonged course of antibiotics. Acute systolic congestive heart failure/?Cardiorenal syndrome: - Iv lasix - Poor UOP. Restrict fluid, low sodium diet. Strict I and O's. - monitor electrolytes. -2D Echo DVT, lower extremity, proximal, chronic/Swollen right arm. - Iv heparin for now per pharmacy. Appreciate help. - GFR < 30 hence Xarelto not ideal, use heparin Obtain ultrasound swollen right arm DM (diabetes mellitus), type 1 with peripheral vascular complications - Sugars better. - D/c Lantus(sugars borderline low in setting of AKI) and  Cont SSI. Acute renal failure syndrome - Monitor. Metastatic lung carcinoma - Defer to Oncology - ? Of comfort care Protein-calorie malnutrition, severe - Ensure TID Code Status: Full code Family Communication: Spoke to patient at bedside. Disposition Plan: ?SNF Consultants:  ID/Surgery/Oncology  Procedures: Right foot MRI-Severe diffuse cellulitis and myofasciitis but no definite soft tissue abscess, septic arthritis or osteomyelitis  Antibiotics:  Vancomycin 11/26/13>   Objective: Filed Vitals:   12/12/2014 0459  BP: 160/76  Pulse: 93  Temp: 98 F (36.7 C)  Resp: 18    Intake/Output Summary (Last 24 hours) at 11/17/2014 0931 Last data filed at 12/11/2014 0503  Gross per 24 hour  Intake  547.8 ml  Output   1076 ml  Net -528.2 ml   Filed Weights   11/26/2014 1900 11/28/14 0700 12/10/2014 0459  Weight: 95.5 kg (210 lb 8.6 oz) 95.1 kg (209 lb 10.5 oz) 95.8 kg (211 lb 3.2 oz)    Exam:   General:  Debilitated.  Cardiovascular: S1S2. RRR.  Respiratory: Bibabsilar rhonchi.  Abdomen: soft, nontender. Positive bowel sounds  Musculoskeletal: swollen right arm. Right big toe ulcer. Some weaping.   Data Reviewed: Basic Metabolic Panel:  Recent Labs Lab 11/23/14 1619 11/20/2014 0917 12/14/2014 1904 11/28/14 0305 11/28/2014 0355  NA 140 140  --  139 139  K 5.5* 4.9*  --  4.9 4.8  CL 102 103  --  103 104  CO2 28 29  --  27 26  GLUCOSE 71 138*  --  218* 121*  BUN 64* 62*  --  70* 67*  CREATININE 2.8* 2.9* 2.84* 2.86* 2.72*  CALCIUM 8.3* 8.3  --  7.5* 7.5*   Liver Function Tests:  Recent Labs Lab 12/11/2014 0917 11/28/14 0305  AST 29 28  ALT 12 15  ALKPHOS 66 66  BILITOT 0.40 0.4  PROT 5.7* 5.2*  ALBUMIN  --  1.5*   No results for input(s): LIPASE, AMYLASE in the last 168 hours. No results for input(s): AMMONIA in the last 168 hours. CBC:  Recent Labs Lab 12/06/2014 0917 12/06/2014 1904 11/28/14 0305 12/14/2014 0355  WBC 16.2* 20.4* 16.8* 15.9*  NEUTROABS  14.8* 19.6*  --   --   HGB 9.1* 9.5* 8.6* 8.4*  HCT 28.3* 29.7* 27.3* 26.6*  MCV 90 89.5 90.1 90.5  PLT 130* 123* 118* 105*   Cardiac Enzymes:  Recent Labs Lab 12/04/2014 1904 11/28/14 11/28/14 0610  TROPONINI 0.07* 0.06* 0.06*   BNP (last 3 results)  Recent Labs  09/12/14 0620 11/23/14 1619  PROBNP 8587.0* 409.0*   CBG:  Recent Labs Lab 11/28/14 0745 11/28/14 1211 11/28/14 1605 11/28/14 2208 12/17/2014 0746  GLUCAP 165* 126* 104* 142* 84    Recent Results (from the past 240 hour(s))  MRSA PCR Screening     Status: Abnormal   Collection Time: 12/11/2014  5:50 AM  Result Value Ref Range Status   MRSA by PCR POSITIVE (A) NEGATIVE Final    Comment:        The GeneXpert MRSA Assay (FDA approved for NASAL specimens only), is one component of a comprehensive MRSA colonization surveillance program. It is not intended to diagnose MRSA infection nor to guide or monitor treatment for MRSA infections. RESULT CALLED TO, READ BACK BY AND VERIFIED WITH: H. HOLDERNESS RN AT 0710 ON 01.13.16 BY SHUEA      Studies: Ct Chest Wo Contrast  12/06/2014   CLINICAL DATA:  Metastatic lung cancer. Evaluate for progression. Renal failure.  EXAM: CT CHEST WITHOUT CONTRAST  TECHNIQUE: Multidetector CT imaging of the chest was performed following the standard protocol without IV contrast.  COMPARISON:  10/24/2014  FINDINGS: Mediastinum/Nodes: Mild cardiomegaly. Trace pericardial fluid or thickening may be physiologic. Anemia, as evidenced by decreased density of the intravascular space.  A right Port-A-Cath new terminates at the low SVC. There is also a left PICC line which terminates at the low SVC.  Anasarca. Left gynecomastia. Left subpectoral node measures 1.2 cm on image 15 and is unchanged.  Aortic and branch vessel atherosclerosis.  High right paratracheal node measures 1.4 cm today versus 1.1 cm on the prior. Precarinal node measures 1.5 cm on image 29 versus 9 mm on the prior.   Prevascular node measures 1.2 cm on image 27 versus 8 mm on the prior. Hilar regions poorly evaluated without intravenous contrast.  Retrocrural node is newly enlarged at 1.4 cm on image 56.  Lungs/Pleura: Minimal motion degradation. Fluid or secretions within the left mainstem bronchus and at the level of the carina.  Moderate centrilobular emphysema. Worsening right lower lobe aeration with collapse/ consolidative change.  A subpleural left lower lobe lung nodule measures 6 mm on image 43 and is new or markedly enlarged from image 37 of the prior. Worsening left base atelectasis. The left lower lobe pulmonary nodule is partially obscured by atelectasis. Measures maximally 1.5 cm on image 45 today versus 1.7 cm on the prior.  Small to moderate right pleural effusion is minimally increased and demonstrates a suggestion of mild loculation inferiorly. Small left pleural effusion is slightly increased.  Upper abdomen: Probable periportal edema. Normal imaged portions of the spleen, stomach, right kidney. Upper abdominal adenopathy is difficult to evaluate secondary to  lack of IV contrast and patient size. The celiac node measures 2.0 cm on image 63 and is similar to 1.9 cm on the prior. Perihepatic ascites, similar.  Musculoskeletal: Anterior right chest wall deformity could be developmental or postsurgical.  IMPRESSION: 1. Progression of thoracic adenopathy. 2. Worsened bibasilar aeration. On the left, likely atelectasis. On the right, this could represent atelectasis or infection. 3. Left base airspace disease partially obscures the left lower lobe pulmonary nodule, which is felt to be similar. A more anterior left lower lobe nodule is new or enlarged and suspicious for metastatic disease. 4. Slight increase in right greater than left pleural effusions. 5. Grossly similar upper abdominal adenopathy, suboptimally evaluated. 6. Similar perihepatic ascites and trace pericardial fluid. 7. Left-sided gynecomastia. 8.  Fluid or secretions in the endobronchial tree. Clinically exclude aspiration. 9. Anasarca and probable anemia.   Electronically Signed   By: Abigail Miyamoto M.D.   On: 11/22/2014 12:19   Mr Foot Right Wo Contrast  11/28/2014   CLINICAL DATA:  Painful and swollen great toe.  Diabetic.  EXAM: MRI OF THE RIGHT FOREFOOT WITHOUT CONTRAST  TECHNIQUE: Multiplanar, multisequence MR imaging was performed. No intravenous contrast was administered.  COMPARISON:  Radiographs 12/13/2014  FINDINGS: There is diffuse and marked subcutaneous soft tissue swelling/ edema and fluid suggesting severe cellulitis. There is also marked and diffuse myofasciitis. I do not see any definite MR findings to suggest septic arthritis or osteomyelitis. Minimal signal abnormality in the distal aspect of the proximal phalanx of the great toe is more likely degenerative change than infection. Moderate metatarsal phalangeal joint degenerative changes.  IMPRESSION: Severe diffuse cellulitis and myofasciitis but no definite soft tissue abscess, septic arthritis or osteomyelitis.   Electronically Signed   By: Kalman Jewels M.D.   On: 11/28/2014 15:33   US Venous Img Lower Unilateral Left  12/09/2014   CLINICAL DATA:  Left lower leg swelling with weeping edema.  EXAM: LEFT LOWER EXTREMITY VENOUS DOPPLER ULTRASOUND  TECHNIQUE: Gray-scale sonography with graded compression, as well as color Doppler and duplex ultrasound, were performed to evaluate the deep venous system from the level of the common femoral vein through the popliteal and proximal calf veins. Spectral Doppler was utilized to evaluate flow at rest and with distal augmentation maneuvers.  COMPARISON:  11/06/2014  FINDINGS: Right common femoral vein is compressible and patent.  Normal compressibility, augmentation and color Doppler flow in the left common femoral vein, left femoral vein and left popliteal vein. Prominent lymph nodes in the left groin.  Again noted is an irregular fluid  collection in the medial left popliteal fossa. This collection roughly measures 5.5 x 1.7 x 1.9 cm. Findings are similar to the previous examination and consistent with a Baker's cyst.  Subcutaneous edema throughout the left ankle. Again noted is an occluded and non compressible left peroneal vein. This finding is similar to the previous examination. Left great saphenous vein is difficult to visualize due to extensive subcutaneous edema. Posterior tibial veins appear to be patent.  IMPRESSION: Persistent deep vein thrombosis in the left peroneal vein. Minimal change from the previous examination. Deep vein thrombosis does not extend into the thigh.  Extensive subcutaneous edema throughout the left lower extremity.  Small Baker's cyst.   Electronically Signed   By: Markus Daft M.D.   On: 12/08/2014 12:06   Dg Chest Port 1 View  12/11/2014   CLINICAL DATA:  PICC placement.  Initial encounter.  EXAM: PORTABLE CHEST - 1 VIEW  COMPARISON:  Chest radiograph performed 11/23/2014, and CT of the chest performed earlier today at 11:42 a.m.  FINDINGS: The patient's left PICC is again noted ending about the mid to distal SVC. A right-sided chest port is also noted ending about the mid to distal SVC.  There is a moderate right-sided pleural effusion. Vascular congestion is noted. The left lung base is incompletely imaged on this study. No pneumothorax is seen.  The cardiomediastinal silhouette is borderline normal in size. No acute osseous abnormalities are identified.  IMPRESSION: 1. Left PICC noted ending about the mid to distal SVC. 2. Moderate right-sided pleural effusion noted. Vascular congestion seen.   Electronically Signed   By: Garald Balding M.D.   On: 12/10/2014 22:23   Dg Foot Complete Right  12/12/2014   CLINICAL DATA:  Wheelchair ran over right foot, with right great toe discoloration and laceration. Initial encounter.  EXAM: RIGHT FOOT COMPLETE - 3+ VIEW  COMPARISON:  None.  FINDINGS: There is no definite  evidence of osseous erosion to suggest osteomyelitis, though evaluation for osteomyelitis is limited on radiograph. There is no evidence of fracture or dislocation. There appears to be soft tissue irregularity at the right great toe. Visualized joint spaces are preserved. Mild diffuse soft tissue swelling is noted about the midfoot and hindfoot.  The subtalar joint is unremarkable in appearance. Scattered vascular calcifications are seen.  IMPRESSION: 1. No definite evidence of osseous erosion to suggest osteomyelitis, though evaluation for osteomyelitis is limited on radiograph. No evidence of fracture or dislocation. 2. Scattered vascular calcifications seen.   Electronically Signed   By: Garald Balding M.D.   On: 12/16/2014 23:24    Scheduled Meds: . albuterol  2.5 mg Nebulization TID  . calcium carbonate  1,250 mg Oral TID  . carvedilol  9.375 mg Oral BID WC  . ceFEPime (MAXIPIME) IV  1 g Intravenous Q12H  . Chlorhexidine Gluconate Cloth  6 each Topical Q0600  . dexamethasone  2 mg Oral Daily  . escitalopram  10 mg Oral Daily  . insulin aspart  0-5 Units Subcutaneous QHS  . insulin aspart  0-9 Units Subcutaneous TID WC  . insulin aspart  3 Units Subcutaneous TID WC  . insulin glargine  10 Units Subcutaneous BID  . isosorbide-hydrALAZINE  1 tablet Oral BID  . mupirocin ointment  1 application Nasal BID  . pantoprazole  40 mg Oral Daily  . sodium chloride  3 mL Intravenous Q12H  . vancomycin  1,000 mg Intravenous Q24H   Continuous Infusions: . furosemide (LASIX) infusion 8 mg/hr (11/28/14 0847)       Time spent: 25 minutes.    Anatasia Tino  [facility] Pager [phone]. If 7PM-7AM, please contact night-coverage at [email or web address], password [facility] 11/22/2014, 9:31 AM  LOS: 2 days

## 2014-11-29 NOTE — Progress Notes (Signed)
Patient ID: Ralph [name], male   DOB: May 04, 1951, 64 y.o.   MRN: [id number]         [place] [facility]    Date of Admission:  12/14/2014           Day 5 vancomycin        Day 3 cefepime       Reason for Consult: Automatic consultation for MRSA bacteremia     Principal Problem:   MRSA bacteremia Active Problems:   Essential hypertension   Protein-calorie malnutrition, severe   Metastatic lung carcinoma   CRI (chronic renal insufficiency)   Diabetic foot ulcer associated with type 2 diabetes mellitus   DVT, lower extremity, proximal, chronic   DM (diabetes mellitus), type 1 with peripheral vascular complications   Chronic systolic heart failure   COPD (chronic obstructive pulmonary disease)   Cigarette smoker   Normocytic anemia   Thrombocytopenia   Depression   Anxiety   . albuterol  2.5 mg Nebulization TID  . calcium carbonate  1,250 mg Oral TID  . carvedilol  9.375 mg Oral BID WC  . ceFEPime (MAXIPIME) IV  1 g Intravenous Q12H  . Chlorhexidine Gluconate Cloth  6 each Topical Q0600  . dexamethasone  2 mg Oral Daily  . escitalopram  10 mg Oral Daily  . insulin aspart  0-5 Units Subcutaneous QHS  . insulin aspart  0-9 Units Subcutaneous TID WC  . insulin aspart  3 Units Subcutaneous TID WC  . insulin glargine  10 Units Subcutaneous BID  . isosorbide-hydrALAZINE  1 tablet Oral BID  . mupirocin ointment  1 application Nasal BID  . pantoprazole  40 mg Oral Daily  . sodium chloride  3 mL Intravenous Q12H  . vancomycin  1,000 mg Intravenous Q24H    Recommendations: 1. Continue vancomycin 2. Discontinue cefepime 3. Await results of transthoracic echocardiogram 4. Agree with Port-A-Cath removal 5. Remove and replace PICC   Assessment: Ralph [name] has MRSA bacteremia. The source could be his right great toe or his Port-A-Cath. He seems to be improving with vancomycin therapy. I agree with removal of his Port-A-Cath. He will need to have his PICC  replaced as it was inserted on 11/24/2014 when he was still bacteremic. I doubt that he has a polymicrobial infection of his right great toe. I will stop cefepime now. I will review the results of his transthoracic echocardiogram and follow-up tomorrow.   HPI: Ralph [name] is a 64 y.o. male who has been undergoing chemotherapy for metastatic lung cancer. Recently his Port-A-Cath has not been functional. He has had fluid weight gain and lower extremity edema. He describes having some shaking chills recently. Both blood cultures drawn on January 8 grew MRSA. He was started on vancomycin and ceftriaxone and was admitted here on 12/09/2014. Repeat blood cultures done on admission are negative to date. He is feeling better. A few weeks ago his wheelchair ran over his right great toe.   Review of Systems: Constitutional: positive for anorexia, chills and weight loss, negative for fevers and sweats Eyes: negative Ears, nose, mouth, throat, and face: negative Respiratory: positive for cough and sputum, negative for dyspnea on exertion and pleurisy/chest pain Cardiovascular: negative Gastrointestinal: negative Genitourinary:negative  Past Medical History  Diagnosis Date  . Allergy   . Anxiety     followed [facility]/Psychiatry every six months.  . Hypertension   . Diabetes mellitus without complication   . Metastatic lung carcinoma 09/16/2014    History  Substance Use Topics  . Smoking status: Current Some Day Smoker -- 0.75 packs/day for 36 years    Types: Cigarettes    Start date: 03/01/1979  . Smokeless tobacco: Never Used  . Alcohol Use: No    Family History  Problem Relation Age of Onset  . Heart disease Mother     AMI  . Cancer Sister     breast  . Heart disease Brother     AMI  . Cancer Brother   . Heart attack Mother   . Heart attack Brother   . Stroke Neg Hx    No Known Allergies  OBJECTIVE: Blood pressure 160/76, pulse 93, temperature 98 F (36.7 C), temperature  source Oral, resp. rate 18, height 6\' 2"  (1.88 m), weight 211 lb 3.2 oz (95.8 kg), SpO2 97 %. General: He is alert and in no distress. He is watching television Skin: No acute rash. Right anterior chest Port-A-Cath site appears normal. He has a left upper arm PICC that was placed on 11/24/2014 and a saline lock in his left hand Lymph nodes: Some scattered cervical and right supraclavicular adenopathy Lungs: Clear Cor: Distant but regular S1 and S2 with no murmur heard Abdomen: Soft and nontender Joints and extremities: There is a moist eschar over his right great toe. There is no expressible drainage, odor or surrounding cellulitis  Lab Results Lab Results  Component Value Date   WBC 15.9* 11/22/2014   HGB 8.4* 11/30/2014   HCT 26.6* 11/26/2014   MCV 90.5 12/15/2014   PLT 105* 12/11/2014    Lab Results  Component Value Date   CREATININE 2.72* 12/01/2014   BUN 67* 11/26/2014   NA 139 12/16/2014   K 4.8 12/16/2014   CL 104 11/19/2014   CO2 26 11/21/2014    Lab Results  Component Value Date   ALT 15 11/28/2014   AST 28 11/28/2014   ALKPHOS 66 11/28/2014   BILITOT 0.4 11/28/2014     Microbiology: Recent Results (from the past 240 hour(s))  MRSA PCR Screening     Status: Abnormal   Collection Time: 12/14/2014  5:50 AM  Result Value Ref Range Status   MRSA by PCR POSITIVE (A) NEGATIVE Final    Comment:        The GeneXpert MRSA Assay (FDA approved for NASAL specimens only), is one component of a comprehensive MRSA colonization surveillance program. It is not intended to diagnose MRSA infection nor to guide or monitor treatment for MRSA infections. RESULT CALLED TO, READ BACK BY AND VERIFIED WITH: H. HOLDERNESS RN AT 0710 ON 01.13.16 BY SHUEA    MRI OF THE RIGHT FOREFOOT WITHOUT CONTRAST 11/28/2014  FINDINGS: There is diffuse and marked subcutaneous soft tissue swelling/ edema and fluid suggesting severe cellulitis. There is also marked and diffuse myofasciitis. I  do not see any definite MR findings to suggest septic arthritis or osteomyelitis. Minimal signal abnormality in the distal aspect of the proximal phalanx of the great toe is more likely degenerative change than infection. Moderate metatarsal phalangeal joint degenerative changes.  IMPRESSION: Severe diffuse cellulitis and myofasciitis but no definite soft tissue abscess, septic arthritis or osteomyelitis.   Electronically Signed  By: Kalman Jewels M.D.  On: 11/28/2014 15:33  Michel Bickers, [place] [phone] pager   [phone] cell 11/18/2014, 9:20 AM

## 2014-11-29 NOTE — Progress Notes (Signed)
  Echocardiogram 2D Echocardiogram has been performed.  Darlina Sicilian M 12/09/2014, 8:54 AM

## 2014-11-29 NOTE — Progress Notes (Signed)
Hypoglycemic Event  CBG: 60 Treatment: D50 IV 25 mL  Symptoms: None  Follow-up CBG: Time:1214 CBG Result:93  Possible Reasons for Event: Inadequate meal intake (NPO after midnight for PAC removal)  Comments/MD notified:Dr. Sanjuana Letters (MD d/c Lantus)    Vansh Reckart, [place] Major  Remember to initiate Hypoglycemia Order Set & complete

## 2014-11-30 ENCOUNTER — Encounter (HOSPITAL_COMMUNITY): Payer: Self-pay | Admitting: Surgery

## 2014-11-30 DIAGNOSIS — A0472 Enterocolitis due to Clostridium difficile, not specified as recurrent: Secondary | ICD-10-CM | POA: Clinically undetermined

## 2014-11-30 DIAGNOSIS — A047 Enterocolitis due to Clostridium difficile: Secondary | ICD-10-CM

## 2014-11-30 LAB — BASIC METABOLIC PANEL
ANION GAP: 8 (ref 5–15)
BUN: 67 mg/dL — ABNORMAL HIGH (ref 6–23)
CO2: 29 mmol/L (ref 19–32)
Calcium: 7.5 mg/dL — ABNORMAL LOW (ref 8.4–10.5)
Chloride: 102 mEq/L (ref 96–112)
Creatinine, Ser: 2.6 mg/dL — ABNORMAL HIGH (ref 0.50–1.35)
GFR, EST AFRICAN AMERICAN: 29 mL/min — AB (ref >90)
GFR, EST NON AFRICAN AMERICAN: 25 mL/min — AB (ref >90)
Glucose, Bld: 71 mg/dL (ref 70–99)
Potassium: 4.8 mmol/L (ref 3.5–5.1)
Sodium: 139 mmol/L (ref 135–145)

## 2014-11-30 LAB — PROTIME-INR
INR: 1.25 (ref 0.00–1.49)
PROTHROMBIN TIME: 15.8 s — AB (ref 11.6–15.2)

## 2014-11-30 LAB — GLUCOSE, CAPILLARY
GLUCOSE-CAPILLARY: 131 mg/dL — AB (ref 70–99)
GLUCOSE-CAPILLARY: 121 mg/dL — AB (ref 70–99)
GLUCOSE-CAPILLARY: 97 mg/dL (ref 70–99)
Glucose-Capillary: 66 mg/dL — ABNORMAL LOW (ref 70–99)
Glucose-Capillary: 101 mg/dL — ABNORMAL HIGH (ref 70–99)
Glucose-Capillary: 83 mg/dL (ref 70–99)

## 2014-11-30 LAB — CBC
HEMATOCRIT: 23.5 % — AB (ref 39.0–52.0)
Hemoglobin: 7.7 g/dL — ABNORMAL LOW (ref 13.0–17.0)
MCH: 29.3 pg (ref 26.0–34.0)
MCHC: 32.8 g/dL (ref 30.0–36.0)
MCV: 89.4 fL (ref 78.0–100.0)
PLATELETS: 84 10*3/uL — AB (ref 150–400)
RBC: 2.63 MIL/uL — AB (ref 4.22–5.81)
RDW: 21.8 % — ABNORMAL HIGH (ref 11.5–15.5)
WBC: 15.4 10*3/uL — AB (ref 4.0–10.5)

## 2014-11-30 LAB — HEPATIC FUNCTION PANEL
ALBUMIN: 1.3 g/dL — AB (ref 3.5–5.2)
ALT: 16 U/L (ref 0–53)
AST: 38 U/L — ABNORMAL HIGH (ref 0–37)
Alkaline Phosphatase: 63 U/L (ref 39–117)
BILIRUBIN TOTAL: 0.6 mg/dL (ref 0.3–1.2)
Bilirubin, Direct: 0.1 mg/dL (ref 0.0–0.3)
Indirect Bilirubin: 0.5 mg/dL (ref 0.3–0.9)
Total Protein: 4.7 g/dL — ABNORMAL LOW (ref 6.0–8.3)

## 2014-11-30 LAB — HEPARIN LEVEL (UNFRACTIONATED)
HEPARIN UNFRACTIONATED: 0.4 [IU]/mL (ref 0.30–0.70)
Heparin Unfractionated: 0.41 IU/mL (ref 0.30–0.70)

## 2014-11-30 LAB — HEMOGLOBIN AND HEMATOCRIT, BLOOD
HCT: 26.3 % — ABNORMAL LOW (ref 39.0–52.0)
Hemoglobin: 8.7 g/dL — ABNORMAL LOW (ref 13.0–17.0)

## 2014-11-30 LAB — APTT: aPTT: 42 seconds — ABNORMAL HIGH (ref 24–37)

## 2014-11-30 LAB — PREPARE RBC (CROSSMATCH)

## 2014-11-30 LAB — CLOSTRIDIUM DIFFICILE BY PCR: Toxigenic C. Difficile by PCR: POSITIVE — AB

## 2014-11-30 MED ORDER — HEPARIN (PORCINE) IN NACL 100-0.45 UNIT/ML-% IJ SOLN
1500.0000 [IU]/h | INTRAMUSCULAR | Status: DC
  Administered 2014-11-30: 1500 [IU]/h via INTRAVENOUS
  Filled 2014-11-30 (×3): qty 250

## 2014-11-30 MED ORDER — FUROSEMIDE 10 MG/ML IJ SOLN
20.0000 mg | Freq: Once | INTRAMUSCULAR | Status: AC
  Administered 2014-11-30: 20 mg via INTRAVENOUS

## 2014-11-30 MED ORDER — METRONIDAZOLE IN NACL 5-0.79 MG/ML-% IV SOLN
500.0000 mg | Freq: Three times a day (TID) | INTRAVENOUS | Status: DC
  Filled 2014-11-30: qty 100

## 2014-11-30 MED ORDER — SODIUM CHLORIDE 0.9 % IV SOLN
Freq: Once | INTRAVENOUS | Status: AC
  Administered 2014-11-30: 10 mL/h via INTRAVENOUS

## 2014-11-30 MED ORDER — ALBUTEROL SULFATE (2.5 MG/3ML) 0.083% IN NEBU
2.5000 mg | INHALATION_SOLUTION | RESPIRATORY_TRACT | Status: DC | PRN
  Administered 2014-12-05 – 2014-12-07 (×2): 2.5 mg via RESPIRATORY_TRACT
  Filled 2014-11-30 (×2): qty 3

## 2014-11-30 MED ORDER — ALBUTEROL SULFATE (2.5 MG/3ML) 0.083% IN NEBU
2.5000 mg | INHALATION_SOLUTION | Freq: Four times a day (QID) | RESPIRATORY_TRACT | Status: DC
Start: 2014-11-30 — End: 2014-12-03
  Administered 2014-11-30 – 2014-12-02 (×5): 2.5 mg via RESPIRATORY_TRACT
  Filled 2014-11-30 (×8): qty 3

## 2014-11-30 MED ORDER — VANCOMYCIN 50 MG/ML ORAL SOLUTION
125.0000 mg | Freq: Four times a day (QID) | ORAL | Status: DC
  Administered 2014-11-30 – 2014-12-07 (×27): 125 mg via ORAL
  Filled 2014-11-30 (×36): qty 2.5

## 2014-11-30 NOTE — Progress Notes (Signed)
[facility] Progress Note  1 Day Post-Op  Subjective: C/o pain but feels a bit better today.  No N/V, tolerating diet.    Objective: Vital signs in last 24 hours: Temp:  [97.6 F (36.4 C)-98 F (36.7 C)] 97.9 F (36.6 C) (01/14 0524) Pulse Rate:  [82-94] 94 (01/14 0524) Resp:  [15-24] 18 (01/14 0524) BP: (108-159)/(57-82) 137/62 mmHg (01/14 0524) SpO2:  [96 %-100 %] 100 % (01/14 0847) Weight:  [208 lb 12.4 oz (94.7 kg)] 208 lb 12.4 oz (94.7 kg) (01/14 0524) Last BM Date: 11/30/14  Intake/Output from previous day: 01/13 0701 - 01/14 0700 In: 1320 [P.O.:600; I.V.:720] Out: 1150 [Urine:1150] Intake/Output this shift: Total I/O In: -  Out: 150 [Urine:150]  PE: Gen:  Alert, NAD, pleasant Right chest wound:  Clean with sanguinous drainage, quite tender, packing removed and replaced Neck:  Right neck with tenderness, sanguinous drainage, no purulence   Lab Results:   Recent Labs  12/01/2014 0355 11/30/14 0620  WBC 15.9* 15.4*  HGB 8.4* 7.7*  HCT 26.6* 23.5*  PLT 105* 84*   BMET  Recent Labs  11/22/2014 0355 11/30/14 0620  NA 139 139  K 4.8 4.8  CL 104 102  CO2 26 29  GLUCOSE 121* 71  BUN 67* 67*  CREATININE 2.72* 2.60*  CALCIUM 7.5* 7.5*   PT/INR  Recent Labs  12/03/2014 1904 11/30/14 0620  LABPROT 32.7* 15.8*  INR 3.16* 1.25   CMP     Component Value Date/Time   NA 139 11/30/2014 0620   NA 140 11/26/2014 0917   K 4.8 11/30/2014 0620   K 4.9* 12/14/2014 0917   CL 102 11/30/2014 0620   CL 103 12/05/2014 0917   CO2 29 11/30/2014 0620   CO2 29 12/13/2014 0917   GLUCOSE 71 11/30/2014 0620   GLUCOSE 138* 12/04/2014 0917   BUN 67* 11/30/2014 0620   BUN 62* 11/30/2014 0917   CREATININE 2.60* 11/30/2014 0620   CREATININE 2.9* 12/12/2014 0917   CALCIUM 7.5* 11/30/2014 0620   CALCIUM 8.3 12/13/2014 0917   PROT 4.7* 11/30/2014 0620   PROT 5.7* 11/21/2014 0917   ALBUMIN 1.3* 11/30/2014 0620   AST 38* 11/30/2014 0620   AST 29 12/01/2014  0917   ALT 16 11/30/2014 0620   ALT 12 11/26/2014 0917   ALKPHOS 63 11/30/2014 0620   ALKPHOS 66 11/19/2014 0917   BILITOT 0.6 11/30/2014 0620   BILITOT 0.40 12/13/2014 0917   GFRNONAA 25* 11/30/2014 0620   GFRAA 29* 11/30/2014 0620   Lipase  No results found for: LIPASE     Studies/Results: Mr Foot Right Wo Contrast  11/28/2014   CLINICAL DATA:  Painful and swollen great toe.  Diabetic.  EXAM: MRI OF THE RIGHT FOREFOOT WITHOUT CONTRAST  TECHNIQUE: Multiplanar, multisequence MR imaging was performed. No intravenous contrast was administered.  COMPARISON:  Radiographs 12/09/2014  FINDINGS: There is diffuse and marked subcutaneous soft tissue swelling/ edema and fluid suggesting severe cellulitis. There is also marked and diffuse myofasciitis. I do not see any definite MR findings to suggest septic arthritis or osteomyelitis. Minimal signal abnormality in the distal aspect of the proximal phalanx of the great toe is more likely degenerative change than infection. Moderate metatarsal phalangeal joint degenerative changes.  IMPRESSION: Severe diffuse cellulitis and myofasciitis but no definite soft tissue abscess, septic arthritis or osteomyelitis.   Electronically Signed   By: Kalman Jewels M.D.   On: 11/28/2014 15:33    Anti-infectives: Anti-infectives    Start  Dose/Rate Route Frequency Ordered Stop   12/17/2014 1200  vancomycin (VANCOCIN) IVPB 1000 mg/200 mL premix     1,000 mg200 mL/hr over 60 Minutes Intravenous Every 24 hours 11/20/2014 0703     11/28/14 1000  fluconazole (DIFLUCAN) tablet 100 mg  Status:  Discontinued     100 mg Oral Daily 11/23/2014 1815 11/28/14 0633   12/02/2014 2000  ceFEPIme (MAXIPIME) 1 g in dextrose 5 % 50 mL IVPB  Status:  Discontinued     1 g100 mL/hr over 30 Minutes Intravenous Every 12 hours 12/02/2014 1815 12/14/2014 0934       Assessment/Plan Infected port-a-catheter Right IJ s/p removal with open wound Leukocytosis - improving 15.4 today Gram + cocci  bacteremia  Plan: 1.  Continue dressing change daily 2.  Ambulate and IS 3.  SCD's and back on heparin drip 4.  IV antibiotics - vancomycin 5.  Advance diet as tolerated    LOS: 3 days    DORT, Stoy Fenn 11/30/2014, 9:51 AM Pager: [phone]

## 2014-11-30 NOTE — Anesthesia Postprocedure Evaluation (Signed)
  Anesthesia Post-op Note  Patient: Ralph [name]  Procedure(s) Performed: Procedure(s): REMOVAL PORT-A-CATH (N/A)  Patient Location: PACU  Anesthesia Type:MAC  Level of Consciousness: awake and alert   Airway and Oxygen Therapy: Patient Spontanous Breathing  Post-op Pain: none  Post-op Assessment: Post-op Vital signs reviewed  Post-op Vital Signs: Reviewed  Last Vitals:  Filed Vitals:   11/30/14 0524  BP: 137/62  Pulse: 94  Temp: 36.6 C  Resp: 18    Complications: No apparent anesthesia complications

## 2014-11-30 NOTE — Progress Notes (Signed)
Patient ID: Ralph [name], male   DOB: 11-05-1951, 64 y.o.   MRN: [id number]         [facility]    Date of Admission:  11/26/2014           Day 6 of IV vancomycin  Principal Problem:   MRSA bacteremia Active Problems:   Essential hypertension   Protein-calorie malnutrition, severe   Metastatic lung carcinoma   CRI (chronic renal insufficiency)   Diabetic foot ulcer associated with type 2 diabetes mellitus   DVT, lower extremity, proximal, chronic   DM (diabetes mellitus), type 1 with peripheral vascular complications   Chronic systolic heart failure   COPD (chronic obstructive pulmonary disease)   Cigarette smoker   Normocytic anemia   Thrombocytopenia   Depression   Anxiety   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 albuterol  2.5 mg Nebulization TID  . calcium carbonate  1,250 mg Oral TID  . carvedilol  9.375 mg Oral BID WC  . Chlorhexidine Gluconate Cloth  6 each Topical Q0600  . escitalopram  10 mg Oral Daily  . furosemide  20 mg Intravenous Daily  . furosemide  20 mg Intravenous Once  . insulin aspart  0-5 Units Subcutaneous QHS  . insulin aspart  0-9 Units Subcutaneous TID WC  . isosorbide-hydrALAZINE  1 tablet Oral BID  . metronidazole  500 mg Intravenous Q8H  . mupirocin ointment  1 application Nasal BID  . pantoprazole  40 mg Oral Daily  . sodium chloride  3 mL Intravenous Q12H  . vancomycin  1,000 mg Intravenous Q24H    Subjective: He is now having diarrhea.  Review of Systems: Pertinent items are noted in HPI.  Past Medical History  Diagnosis Date  . Allergy   . Anxiety     followed [facility] every six months.  . Hypertension   . Diabetes mellitus without complication   . Metastatic lung carcinoma 09/16/2014    History  Substance Use Topics  . Smoking status: Current Some Day Smoker -- 0.75 packs/day for 36 years    Types: Cigarettes   Start date: 03/01/1979  . Smokeless tobacco: Never Used  . Alcohol Use: No    Family History  Problem Relation Age of Onset  . Heart disease Mother     AMI  . Cancer Sister     breast  . Heart disease Brother     AMI  . Cancer Brother   . Heart attack Mother   . Heart attack Brother   . Stroke Neg Hx    No Known Allergies  OBJECTIVE: Blood pressure 118/65, pulse 91, temperature 98.3 F (36.8 C), temperature source Oral, resp. rate 16, height 6\' 2"  (1.88 m), weight 208 lb 12.4 oz (94.7 kg), SpO2 99 %. General: He is sleeping but arouses easily Skin: His infected Port-A-Cath and previous PICC have been removed. He has a new PICC Lungs: Clear but with decreased breath sounds on the right posteriorly Cor: Distant but regular S1 and S2 with no murmurs   Lab Results Lab Results  Component Value Date   WBC 15.4* 11/30/2014   HGB 7.7* 11/30/2014   HCT 23.5* 11/30/2014   MCV 89.4 11/30/2014   PLT 84* 11/30/2014    Lab Results  Component Value Date   CREATININE 2.60* 11/30/2014   BUN 67* 11/30/2014   NA 139 11/30/2014   K 4.8 11/30/2014   CL  102 11/30/2014   CO2 29 11/30/2014    Lab Results  Component Value Date   ALT 16 11/30/2014   AST 38* 11/30/2014   ALKPHOS 63 11/30/2014   BILITOT 0.6 11/30/2014     Microbiology: Recent Results (from the past 240 hour(s))  Culture, blood (routine x 2)     Status: None (Preliminary result)   Collection Time: 11/25/2014  7:00 PM  Result Value Ref Range Status   Specimen Description BLOOD LEFT ARM  Final   Special Requests BOTTLES DRAWN AEROBIC AND ANAEROBIC 10CC  Final   Culture   Final           BLOOD CULTURE RECEIVED NO GROWTH TO DATE CULTURE WILL BE HELD FOR 5 DAYS BEFORE ISSUING A FINAL NEGATIVE REPORT Performed at [facility]    Report Status PENDING  Incomplete  Culture, blood (routine x 2)     Status: None (Preliminary result)   Collection Time: 12/15/2014  7:04 PM  Result Value Ref Range Status   Specimen  Description BLOOD RIGHT HAND  Final   Special Requests BOTTLES DRAWN AEROBIC AND ANAEROBIC  10CC  Final   Culture   Final    GRAM POSITIVE COCCI IN CLUSTERS Note: Culture results may be compromised due to an inadequate volume of blood received in culture bottles. Gram Stain Report Called to,Read Back By and Verified With: GRACE F@9 :57AM ON 11/30/14 BY DANTS Performed at [facility]    Report Status PENDING  Incomplete  MRSA PCR Screening     Status: Abnormal   Collection Time: 12/10/2014  5:50 AM  Result Value Ref Range Status   MRSA by PCR POSITIVE (A) NEGATIVE Final    Comment:        The GeneXpert MRSA Assay (FDA approved for NASAL specimens only), is one component of a comprehensive MRSA colonization surveillance program. It is not intended to diagnose MRSA infection nor to guide or monitor treatment for MRSA infections. RESULT CALLED TO, READ BACK BY AND VERIFIED WITH: H. HOLDERNESS RN AT 0710 ON 01.13.16 BY SHUEA   Clostridium Difficile by PCR     Status: Abnormal   Collection Time: 11/28/2014  5:53 PM  Result Value Ref Range Status   C difficile by pcr POSITIVE (A) NEGATIVE Final    Comment: CRITICAL RESULT CALLED TO, READ BACK BY AND VERIFIED WITH: SISON RN 11:10 11/30/14 ([email or web address]) Performed at [facility]     Assessment: He has MRSA bacteremia related to an infected Port-A-Cath that is now been removed. No evidence of endocarditis is been seen on transthoracic echocardiogram. One of 2 repeat blood cultures from 12/16/2014 growing gram-positive cocci in clusters. I will repeat another set of blood cultures today. I am not sure is worth putting him through a transesophageal echocardiogram. Given how sick he is I favor treating him for 4 weeks anyway.  He has also developed C. difficile colitis. I will treat him with oral vancomycin. Recent published data strongly suggests that vancomycin is superior to metronidazole and treating C. difficile  colitis.  Plan: 1. Continue IV vancomycin 2. Change IV metronidazole to oral vancomycin 3. Repeat blood cultures  Michel Bickers, MD [facility] [phone] pager   [phone] cell 11/30/2014, 2:35 PM

## 2014-11-30 NOTE — Progress Notes (Signed)
Mr. Linsley actually looks better. He seems to continue to improve.  He underwent surgery yesterday to remove a Port-A-Cath. This appear to be infected. There was a material at the Port-A-Cath site. Also, was noted to be infection at the insertion site into the subclavian vein. Cultures are pending.  He does not look as diaphoretic.  He seems to be eating better. He is not having any obvious nausea or vomiting.  We repeated an echocardiogram on him. Surprisingly enough, his ejection fraction is now up to 45%. No obvious vegetation is noted on the valves. He does have a "large" pleural effusion. It may be helpful to try to drain the pleural effusion.  He does have protective coverings on his lower legs. There are still has some swelling in his legs.  His hemoglobin 7.7 today. I probably would transfuse him. I think he could use the extra protein "loaded" from the blood. This may help with some swelling that he has.  It will be interesting to see how well he improves and how much he improves once this infection is treated.  On physical exam, his temp is 97.9. Pulse 94. Blood pressure 137/62. Head and neck exam shows no ocular or oral lesions. He has no mucositis. There is no adenopathy in the neck. His lungs also some decrease at the bases. Some scattered wheezes are noted. Cardiac exam regular rate and rhythm with no murmurs, rubs or bruits. Abdomen is soft. He has good bowel sounds. There is no fluid wave. There is no palpable liver or spleen tip. Extremities shows the 2-3+ edema in his left lower leg. He has 1-2+ edema in his right lower leg.  I suspect this will be an extended hospital stay. He still has quite a few issues going on.  We will go ahead and transfuse him today.  He was on blood thinner with Xarelto. I see that he longer is on any. I suspect this probably was stopped with his surgery that he had. [name] He may need to be on heparin for right now given that he'll probably be having  procedures done.  Mikey Kirschner 3:19

## 2014-11-30 NOTE — Progress Notes (Signed)
(+)   Blood culture - GPR in clusters collected 12/13/2014 - MD paged made aware.

## 2014-11-30 NOTE — Progress Notes (Signed)
At 2140, patient's BS was 66. Orange juice was given. No symptoms of hypoglycemia. BS was rechecked and it came up to 78. When patient woke up at 0115, BS was checked to be on the safe side. It had dropped back down to 66. Will recheck.

## 2014-11-30 NOTE — Progress Notes (Addendum)
ANTICOAGULATION CONSULT NOTE - Initial Consult  Pharmacy Consult for Heparin Indication: VTE treatment  No Known Allergies  Patient Measurements: Height: 6\' 2"  (188 cm) Weight: 208 lb 12.4 oz (94.7 kg) IBW/kg (Calculated) : 82.2 Heparin Dosing Weight: actual weight  Vital Signs: Temp: 97.9 F (36.6 C) (01/14 0524) Temp Source: Oral (01/14 0524) BP: 137/62 mmHg (01/14 0524) Pulse Rate: 94 (01/14 0524)  Labs:  Recent Labs  11/27/2014 1904 11/28/14 11/28/14 0305 11/28/14 0610 11/28/14 1000 12/12/2014 0355 11/30/14 0620  HGB 9.5*  --  8.6*  --   --  8.4* 7.7*  HCT 29.7*  --  27.3*  --   --  26.6* 23.5*  PLT 123*  --  118*  --   --  105* 84*  APTT  --   --   --   --  62*  --   --   LABPROT 32.7*  --   --   --   --   --  15.8*  INR 3.16*  --   --   --   --   --  1.25  HEPARINUNFRC  --   --   --   --   --   --  0.40  CREATININE 2.84*  --  2.86*  --   --  2.72* 2.60*  TROPONINI 0.07* 0.06*  --  0.06*  --   --   --     Estimated Creatinine Clearance: 34.3 mL/min (by C-G formula based on Cr of 2.6).  Medications:  Infusions:    Assessment: 38 YOM admitted 1/11 with metastatic lung cancer and history of LLE DVT with possible upper extremity DVT. He has been on Xarelto at [facility] rehab, initially 15mg  BID started on 12/22 with plan to change to 20mg  once daily dosing on 1/12, but on 1/4, order was restarted as Xarelto 15 mg BID x21d with change to 20mg  daily on 1/26.    Significant events: 1/11 Continued Xarelto 1/12 Last Xarelto dose given ~ 8am.  Plan to change from Xarelto to Heparin, but start held d/t recent Xarelto and worsening renal function. 1/13 Heparin initiation held for infected PAC removed.  PICC line inserted. 1/14 Pharmacy is re-consulted to dose IV Heparin   Today, 11/30/2014  INR 1.25  aPTT 62  Heparin level 0.4, level elevated despite NO heparin infusion d/t recent Xarelto use  CBC: Hgb decreased to 7.7, Plt decreased to 84  No active bleeding  reported today.  RN reports bleeding from [facility] removal site on 1/13.  Orders to transfuse 2 units PRBC  SCr 2.6, improving with CrCl ~ 34 ml/min   Goal of Therapy:  Heparin level 0.3-0.7 units/ml aPTT 1.5-2.5 times control (~45-75 seconds) Monitor platelets by anticoagulation protocol: Yes   Plan:   No heparin bolus d/t recent Xarelto use, low Hgb, and poor renal function  Start heparin IV infusion at 1500 units/hr  APTT and Heparin level 8 hours after starting  Daily heparin level, APTT, and CBC  Gretta Arab PharmD, BCPS Pager [phone] 11/30/2014 7:52 AM

## 2014-11-30 NOTE — Progress Notes (Signed)
[facility] PROGRESS NOTE  Ralph [name] BJY:[id number] DOB: July 04, 1951 DOA: 12/14/2014 PCP: No PCP Per Patient  Summary/Subjective: Appreciate Gen surgery/ID/Oncology. Ralph [name] is a pleasant 64 y.o. male with a complex medical history including diabetes mellitus type 2, anxiety and depression, systolic heart failure with an EF of 25% back in October 2015,metastatic lung cancer on chemotherapy(now discontinued), who came in for generalized weakness and was found to have MRSA bacteremia(BC on 11/24/13). There was concern for port-cath/picc line infection, hence removal of both done. Blood cultures sent on 11/27/2013 growing gram-positive cocci. Patient also had stool for C. difficile PCR send on 11/29/13 and it is positive. His hemoglobin is 7.7 g/dL today and he will get PRBC transfusion per Oncology. He has been restarted on anticoagulation with IV heparin. We'll need to have a high index of suspicion for bleeding.  Will continue antibiotic management per ID. Have added Flagyl for now. 2-D echocardiogram today showed EF 45%, trace pericardial effusion and large pleural effusion. No obvious vegetations. Will defer to ID regarding further septic workup. Patient states that he feels a little bit weak. He reports that he still has diarrhea. Plan MRSA bacteremia/Right great toe ulcer/ Diabetic foot ulcer associated with type 2 diabetes mellitus/C. difficile colitis/large pleural effusion  On Vancomycin per pharmacy  Defer antibiotic management to ID. Added Flagyl for now  Port-cath/Picc line removed and another PICC line inserted. Acute systolic congestive heart failure/?Cardiorenal syndrome: - Iv lasix - Poor UOP. Restrict fluid, low sodium diet. Strict I and O's. - monitor electrolytes. -2D Echo reveiweed DVT, lower extremity, proximal, chronic/Swollen right arm. - Iv heparin for now per pharmacy. Appreciate help. - GFR < 30 hence Xarelto not ideal, use heparin - Ultrasound ofswollen  right arm not done? DM (diabetes mellitus), type 1 with peripheral vascular complications - Sugars better. - Discontinued Lantus(sugars borderline low in setting of AKI)  - Cont SSI. Acute renal failure syndrome - No acute change. Monitor. Metastatic lung carcinoma - Defer to Oncology - ? Of comfort care Protein-calorie malnutrition, severe - Ensure TID Code Status: Full code Family Communication: Spoke to patient at bedside. Disposition Plan: ?SNF Consultants:  ID/Surgery/Oncology  Procedures: Right foot MRI-Severe diffuse cellulitis and myofasciitis but no definite soft tissue abscess, septic arthritis or osteomyelitis  Antibiotics:  Vancomycin 11/26/13>  Flagyl 11/30/2013>  Objective: Filed Vitals:   11/30/14 1315  BP: 118/65  Pulse: 91  Temp: 98.3 F (36.8 C)  Resp: 16    Intake/Output Summary (Last 24 hours) at 11/30/14 1436 Last data filed at 11/30/14 0813  Gross per 24 hour  Intake   1320 ml  Output    800 ml  Net    520 ml   Filed Weights   11/28/14 0700 12/03/2014 0459 11/30/14 0524  Weight: 95.1 kg (209 lb 10.5 oz) 95.8 kg (211 lb 3.2 oz) 94.7 kg (208 lb 12.4 oz)    Exam:   General:  Frail  Cardiovascular: RRR. No murmurs.  Respiratory: Good air entry bilaterally. Bibasilar rhonchi.  Abdomen: Soft and nontender. Bowel sounds normal.  Musculoskeletal: PICC line left arm. Right arm is swollen.   Data Reviewed: Basic Metabolic Panel:  Recent Labs Lab 11/23/14 1619 12/05/2014 0917 11/19/2014 1904 11/28/14 0305 12/15/2014 0355 11/30/14 0620  NA 140 140  --  139 139 139  K 5.5* 4.9*  --  4.9 4.8 4.8  CL 102 103  --  103 104 102  CO2 28 29  --  27 26 29   GLUCOSE  71 138*  --  218* 121* 71  BUN 64* 62*  --  70* 67* 67*  CREATININE 2.8* 2.9* 2.84* 2.86* 2.72* 2.60*  CALCIUM 8.3* 8.3  --  7.5* 7.5* 7.5*   Liver Function Tests:  Recent Labs Lab 12/11/2014 0917 11/28/14 0305 11/30/14 0620  AST 29 28 38*  ALT 12 15 16   ALKPHOS 66 66 63   BILITOT 0.40 0.4 0.6  PROT 5.7* 5.2* 4.7*  ALBUMIN  --  1.5* 1.3*   No results for input(s): LIPASE, AMYLASE in the last 168 hours. No results for input(s): AMMONIA in the last 168 hours. CBC:  Recent Labs Lab 12/11/2014 0917 11/21/2014 1904 11/28/14 0305 11/17/2014 0355 11/30/14 0620  WBC 16.2* 20.4* 16.8* 15.9* 15.4*  NEUTROABS 14.8* 19.6*  --   --   --   HGB 9.1* 9.5* 8.6* 8.4* 7.7*  HCT 28.3* 29.7* 27.3* 26.6* 23.5*  MCV 90 89.5 90.1 90.5 89.4  PLT 130* 123* 118* 105* 84*   Cardiac Enzymes:  Recent Labs Lab 11/26/2014 1904 11/28/14 11/28/14 0610  TROPONINI 0.07* 0.06* 0.06*   BNP (last 3 results)  Recent Labs  09/12/14 0620 11/23/14 1619  PROBNP 8587.0* 409.0*   CBG:  Recent Labs Lab 12/14/2014 2239 11/30/14 0119 11/30/14 0201 11/30/14 0721 11/30/14 1152  GLUCAP 78 66* 101* 131* 121*    Recent Results (from the past 240 hour(s))  Culture, blood (routine x 2)     Status: None (Preliminary result)   Collection Time: 11/25/2014  7:00 PM  Result Value Ref Range Status   Specimen Description BLOOD LEFT ARM  Final   Special Requests BOTTLES DRAWN AEROBIC AND ANAEROBIC 10CC  Final   Culture   Final           BLOOD CULTURE RECEIVED NO GROWTH TO DATE CULTURE WILL BE HELD FOR 5 DAYS BEFORE ISSUING A FINAL NEGATIVE REPORT Performed at [facility]    Report Status PENDING  Incomplete  Culture, blood (routine x 2)     Status: None (Preliminary result)   Collection Time: 11/26/2014  7:04 PM  Result Value Ref Range Status   Specimen Description BLOOD RIGHT HAND  Final   Special Requests BOTTLES DRAWN AEROBIC AND ANAEROBIC  10CC  Final   Culture   Final    GRAM POSITIVE COCCI IN CLUSTERS Note: Culture results may be compromised due to an inadequate volume of blood received in culture bottles. Gram Stain Report Called to,Read Back By and Verified With: GRACE F@9 :57AM ON 11/30/14 BY DANTS Performed at [facility]    Report Status PENDING  Incomplete   MRSA PCR Screening     Status: Abnormal   Collection Time: 12/08/2014  5:50 AM  Result Value Ref Range Status   MRSA by PCR POSITIVE (A) NEGATIVE Final    Comment:        The GeneXpert MRSA Assay (FDA approved for NASAL specimens only), is one component of a comprehensive MRSA colonization surveillance program. It is not intended to diagnose MRSA infection nor to guide or monitor treatment for MRSA infections. RESULT CALLED TO, READ BACK BY AND VERIFIED WITH: H. HOLDERNESS RN AT 0710 ON 01.13.16 BY SHUEA   Clostridium Difficile by PCR     Status: Abnormal   Collection Time: 11/18/2014  5:53 PM  Result Value Ref Range Status   C difficile by pcr POSITIVE (A) NEGATIVE Final    Comment: CRITICAL RESULT CALLED TO, READ BACK BY AND VERIFIED WITH: SISON RN  11:10 11/30/14 ([email or web address]) Performed at [facility]      Studies: Mr Foot Right Wo Contrast  11/28/2014   CLINICAL DATA:  Painful and swollen great toe.  Diabetic.  EXAM: MRI OF THE RIGHT FOREFOOT WITHOUT CONTRAST  TECHNIQUE: Multiplanar, multisequence MR imaging was performed. No intravenous contrast was administered.  COMPARISON:  Radiographs 12/06/2014  FINDINGS: There is diffuse and marked subcutaneous soft tissue swelling/ edema and fluid suggesting severe cellulitis. There is also marked and diffuse myofasciitis. I do not see any definite MR findings to suggest septic arthritis or osteomyelitis. Minimal signal abnormality in the distal aspect of the proximal phalanx of the great toe is more likely degenerative change than infection. Moderate metatarsal phalangeal joint degenerative changes.  IMPRESSION: Severe diffuse cellulitis and myofasciitis but no definite soft tissue abscess, septic arthritis or osteomyelitis.   Electronically Signed   By: Kalman Jewels M.D.   On: 11/28/2014 15:33    Scheduled Meds: . albuterol  2.5 mg Nebulization TID  . calcium carbonate  1,250 mg Oral TID  . carvedilol  9.375 mg Oral BID WC  .  Chlorhexidine Gluconate Cloth  6 each Topical Q0600  . escitalopram  10 mg Oral Daily  . furosemide  20 mg Intravenous Daily  . furosemide  20 mg Intravenous Once  . insulin aspart  0-5 Units Subcutaneous QHS  . insulin aspart  0-9 Units Subcutaneous TID WC  . isosorbide-hydrALAZINE  1 tablet Oral BID  . metronidazole  500 mg Intravenous Q8H  . mupirocin ointment  1 application Nasal BID  . pantoprazole  40 mg Oral Daily  . sodium chloride  3 mL Intravenous Q12H  . vancomycin  1,000 mg Intravenous Q24H   Continuous Infusions: . heparin 1,500 Units/hr (11/30/14 1048)    Principal Problem:   MRSA bacteremia Active Problems:   Essential hypertension   Protein-calorie malnutrition, severe   Metastatic lung carcinoma   CRI (chronic renal insufficiency)   Diabetic foot ulcer associated with type 2 diabetes mellitus   DVT, lower extremity, proximal, chronic   DM (diabetes mellitus), type 1 with peripheral vascular complications   Chronic systolic heart failure   COPD (chronic obstructive pulmonary disease)   Cigarette smoker   Normocytic anemia   Thrombocytopenia   Depression   Anxiety   Infection of venous access Port-a-catheter s/p removal 11/29/2013   Enteritis due to Clostridium difficile    Time spent: 35 minutes    Orpha Dain  [facility] Pager [phone]. If 7PM-7AM, please contact night-coverage at [email or web address], password [facility] 11/30/2014, 2:36 PM  LOS: 3 days

## 2014-12-01 ENCOUNTER — Inpatient Hospital Stay (HOSPITAL_COMMUNITY): Payer: Medicaid Other

## 2014-12-01 DIAGNOSIS — J90 Pleural effusion, not elsewhere classified: Secondary | ICD-10-CM

## 2014-12-01 LAB — TYPE AND SCREEN
ABO/RH(D): A POS
ANTIBODY SCREEN: NEGATIVE
UNIT DIVISION: 0
UNIT DIVISION: 0
UNIT DIVISION: 0
Unit division: 0

## 2014-12-01 LAB — PROTIME-INR
INR: 1.17 (ref 0.00–1.49)
PROTHROMBIN TIME: 15 s (ref 11.6–15.2)

## 2014-12-01 LAB — COMPREHENSIVE METABOLIC PANEL
ALK PHOS: 69 U/L (ref 39–117)
ALT: 18 U/L (ref 0–53)
AST: 42 U/L — AB (ref 0–37)
Albumin: 1.2 g/dL — ABNORMAL LOW (ref 3.5–5.2)
Anion gap: 5 (ref 5–15)
BILIRUBIN TOTAL: 0.6 mg/dL (ref 0.3–1.2)
BUN: 64 mg/dL — ABNORMAL HIGH (ref 6–23)
CO2: 27 mmol/L (ref 19–32)
CREATININE: 2.6 mg/dL — AB (ref 0.50–1.35)
Calcium: 7 mg/dL — ABNORMAL LOW (ref 8.4–10.5)
Chloride: 103 mEq/L (ref 96–112)
GFR calc Af Amer: 29 mL/min — ABNORMAL LOW (ref >90)
GFR calc non Af Amer: 25 mL/min — ABNORMAL LOW (ref >90)
Glucose, Bld: 73 mg/dL (ref 70–99)
POTASSIUM: 4.4 mmol/L (ref 3.5–5.1)
SODIUM: 135 mmol/L (ref 135–145)
TOTAL PROTEIN: 4.8 g/dL — AB (ref 6.0–8.3)

## 2014-12-01 LAB — CBC
HEMATOCRIT: 28.6 % — AB (ref 39.0–52.0)
HEMOGLOBIN: 9.3 g/dL — AB (ref 13.0–17.0)
MCH: 29.1 pg (ref 26.0–34.0)
MCHC: 32.5 g/dL (ref 30.0–36.0)
MCV: 89.4 fL (ref 78.0–100.0)
Platelets: 91 10*3/uL — ABNORMAL LOW (ref 150–400)
RBC: 3.2 MIL/uL — ABNORMAL LOW (ref 4.22–5.81)
RDW: 20.6 % — AB (ref 11.5–15.5)
WBC: 15.4 10*3/uL — ABNORMAL HIGH (ref 4.0–10.5)

## 2014-12-01 LAB — HEPARIN LEVEL (UNFRACTIONATED)
Heparin Unfractionated: 0.25 IU/mL — ABNORMAL LOW (ref 0.30–0.70)
Heparin Unfractionated: 0.29 IU/mL — ABNORMAL LOW (ref 0.30–0.70)
Heparin Unfractionated: 0.32 IU/mL (ref 0.30–0.70)

## 2014-12-01 LAB — GLUCOSE, CAPILLARY
GLUCOSE-CAPILLARY: 80 mg/dL (ref 70–99)
GLUCOSE-CAPILLARY: 77 mg/dL (ref 70–99)
Glucose-Capillary: 80 mg/dL (ref 70–99)
Glucose-Capillary: 101 mg/dL — ABNORMAL HIGH (ref 70–99)

## 2014-12-01 LAB — APTT: aPTT: 70 seconds — ABNORMAL HIGH (ref 24–37)

## 2014-12-01 MED ORDER — HEPARIN (PORCINE) IN NACL 100-0.45 UNIT/ML-% IJ SOLN
1650.0000 [IU]/h | INTRAMUSCULAR | Status: DC
  Administered 2014-12-01 – 2014-12-03 (×3): 1650 [IU]/h via INTRAVENOUS
  Filled 2014-12-01 (×4): qty 250

## 2014-12-01 MED ORDER — FUROSEMIDE 40 MG PO TABS
40.0000 mg | ORAL_TABLET | Freq: Every day | ORAL | Status: DC
  Administered 2014-12-01 – 2014-12-05 (×5): 40 mg via ORAL
  Filled 2014-12-01 (×5): qty 1

## 2014-12-01 MED ORDER — HEPARIN (PORCINE) IN NACL 100-0.45 UNIT/ML-% IJ SOLN
1600.0000 [IU]/h | INTRAMUSCULAR | Status: DC
  Filled 2014-12-01 (×2): qty 250

## 2014-12-01 NOTE — Progress Notes (Signed)
Mr. carreker actually looks pretty good. Unfortunately, he now has Clostridium difficile colitis. He is on oral vancomycin. Infectious disease is following along.  He has MRSA bacteremia. The Port-A-Cath has been removed. He is on IV antibiotics for this. I think he is on IV vancomycin.  He actually looks and sounds pretty good. He does have a large right pleural effusion on chest x-ray. I think it would be worthwhile to see about draining this.  He is on heparin drip. I would keep him on the heparin drip for right now until we are sure that any invasive procedure will be completed.  His lab work today shows white cell count 15.4,  hemoglobin 9.3 and platelet count 91,000. There is no metabolic panel back yet.  His vital signs are okay. Temperature 98.3. Pulse 88. Blood pressure 138/64. Head and neck exam shows no ocular or oral lesions. Has no palpable cervical or supraclavicular lymph nodes. Lungs are with some decreased breath sounds bilaterally. He has some decreased sounds, more so, on the right side. Cardiac exam regular rate and rhythm with no murmurs, rubs or bruits. Abdomen is soft. Has good bowel sounds. Extremities still shows the edema in his lower legs. This looks a little bit better.  I think, for today, a thoracentesis would be appropriate. Again is on a heparin drip so this can be stopped easily. I would try to drain the right effusion. I was sent off for culture and cytology.  I would continue the heparin drip for right now. It is possible he may need some more invasive procedures.  We will continue to follow along.  I appreciate all the great care that he is getting on 4 E.!!  Pete  Psalm 66:5

## 2014-12-01 NOTE — Progress Notes (Signed)
Pt is currently on heparin drip @ 1650 units/hr for  DVT. HL= 0.32 (goal 0.3-0.7) which is therapeutic. No bleeding per RN. Will f/u am HL and CBC (monitor PLT by anticoagulation protocol: Yes).  Thanks, Garnet Sierras 12/01/2014

## 2014-12-01 NOTE — Progress Notes (Signed)
[facility] PROGRESS NOTE  Ralph [name]:[id number] DOB: 1951/11/04 DOA: 11/25/2014 PCP: No PCP Per Patient  Summary/Subjective: Appreciate Gen surgery/ID/Oncology/IR. Ralph [name] is a pleasant 64 y.o. male with a complex medical history including diabetes mellitus type 2, anxiety and depression, systolic heart failure with an EF of 25% back in October 2015,metastatic lung cancer on chemotherapy(now discontinued), who came in for generalized weakness and was found to have MRSA bacteremia(BC on 11/24/13). There was concern for port-cath/picc line infection, hence removal of both done. Blood cultures sent on 11/27/2013 growing gram-positive cocci. Patient also had stool for C. difficile PCR send on 11/29/13 and it is positive. His hemoglobin dropped to 7.7 g/dL prompting PRBC transfusion per Oncology(improved to 9.3 g/dL). He is on anticoagulation with IV heparin. Patient is on IV(to complete 3 weeks) and oral vancomycin per ID. He had 2-D echocardiogram which showed EF 45%, trace pericardial effusion and large pleural effusion and had ultrasound-guided drainage(1.3L of yellow colored fluid drained and labs pending). 2-D echo showed No obvious vegetations. Patient reports feeling a little better. He still has diarrhea but cough is improved. Plan is to continue current management including antibiotics, and IV heparin drip per pharmacy. Will defer to hematology for management of anemia and to ID for management of MRSA/CDiff. Plan MRSA bacteremia/Right great toe ulcer/ Diabetic foot ulcer associated with type 2 diabetes mellitus/C. difficile colitis/large pleural effusion  On Vancomycin per pharmacy  Defer antibiotic management to ID.   Port-cath/Picc line removed and another PICC line inserted.  Follow pleural fluid analysis.  Follow repeat blood cultures. Acute systolic congestive heart failure/?Cardiorenal syndrome: - Switch Lasix to oral as patient continues to make progress - Poor  UOP. Restrict fluid, low sodium diet. Strict I and O's. - monitor electrolytes. DVT, lower extremity, proximal, chronic/Swollen right arm. - Iv heparin for now per pharmacy. Appreciate help.  - GFR < 30 hence Xarelto not ideal, use heparin DM (diabetes mellitus), type 1 with peripheral vascular complications - Sugars stable. - Cont SSI. Acute renal failure syndrome - No acute changes. Monitor. Metastatic lung carcinoma/anemia/thrombocytopenia - Defer to Oncology - Monitor hemoglobin Protein-calorie malnutrition, severe - Ensure TID Code Status: Full code Family Communication: Spoke to patient at bedside. Disposition Plan: ?SNF Consultants:  ID/Surgery/Oncology/I is a hospital and light during the normal R  Procedures: Right foot MRI-Severe diffuse cellulitis and myofasciitis but no definite soft tissue abscess, septic arthritis or osteomyelitis  Antibiotics:  Vancomycin 11/26/13>    Oral Vanc 11/30/2013> Objective: Filed Vitals:   12/01/14 1034  BP: 106/45  Pulse:   Temp:   Resp:     Intake/Output Summary (Last 24 hours) at 12/01/14 1339 Last data filed at 12/01/14 1151  Gross per 24 hour  Intake 1127.73 ml  Output    750 ml  Net 377.73 ml   Filed Weights   12/06/2014 0459 11/30/14 0524 12/01/14 0628  Weight: 95.8 kg (211 lb 3.2 oz) 94.7 kg (208 lb 12.4 oz) 95.6 kg (210 lb 12.2 oz)    Exam:   General:  Frail  Cardiovascular: RRR. No murmurs. S1S2 heard.  Respiratory: Good air entry bilaterally.  Abdomen: Soft and nontender. Positive bowel sounds.  Musculoskeletal: No drainage from right big toe but slightly edematous.   Data Reviewed: Basic Metabolic Panel:  Recent Labs Lab 12/13/2014 0917 12/16/2014 1904 11/28/14 0305 11/25/2014 0355 11/30/14 0620 12/01/14 0615  NA 140  --  139 139 139 135  K 4.9*  --  4.9 4.8 4.8 4.4  CL 103  --  103 104 102 103  CO2 29  --  27 26 29 27   GLUCOSE 138*  --  218* 121* 71 73  BUN 62*  --  70* 67* 67* 64*   CREATININE 2.9* 2.84* 2.86* 2.72* 2.60* 2.60*  CALCIUM 8.3  --  7.5* 7.5* 7.5* 7.0*   Liver Function Tests:  Recent Labs Lab 12/04/2014 0917 11/28/14 0305 11/30/14 0620 12/01/14 0615  AST 29 28 38* 42*  ALT 12 15 16 18   ALKPHOS 66 66 63 69  BILITOT 0.40 0.4 0.6 0.6  PROT 5.7* 5.2* 4.7* 4.8*  ALBUMIN  --  1.5* 1.3* 1.2*   No results for input(s): LIPASE, AMYLASE in the last 168 hours. No results for input(s): AMMONIA in the last 168 hours. CBC:  Recent Labs Lab 12/11/2014 0917  12/08/2014 1904 11/28/14 0305 12/09/2014 0355 11/30/14 0620 11/30/14 2246 12/01/14 0615  WBC 16.2*  --  20.4* 16.8* 15.9* 15.4*  --  15.4*  NEUTROABS 14.8*  --  19.6*  --   --   --   --   --   HGB 9.1*  < > 9.5* 8.6* 8.4* 7.7* 8.7* 9.3*  HCT 28.3*  < > 29.7* 27.3* 26.6* 23.5* 26.3* 28.6*  MCV 90  --  89.5 90.1 90.5 89.4  --  89.4  PLT 130*  --  123* 118* 105* 84*  --  91*  < > = values in this interval not displayed. Cardiac Enzymes:  Recent Labs Lab 12/01/2014 1904 11/28/14 11/28/14 0610  TROPONINI 0.07* 0.06* 0.06*   BNP (last 3 results)  Recent Labs  09/12/14 0620 11/23/14 1619  PROBNP 8587.0* 409.0*   CBG:  Recent Labs Lab 11/30/14 1152 11/30/14 1727 11/30/14 2158 12/01/14 0731 12/01/14 1132  GLUCAP 121* 97 83 80 80    Recent Results (from the past 240 hour(s))  Culture, blood (routine x 2)     Status: None (Preliminary result)   Collection Time: 12/17/2014  7:00 PM  Result Value Ref Range Status   Specimen Description BLOOD LEFT ARM  Final   Special Requests BOTTLES DRAWN AEROBIC AND ANAEROBIC 10CC  Final   Culture   Final           BLOOD CULTURE RECEIVED NO GROWTH TO DATE CULTURE WILL BE HELD FOR 5 DAYS BEFORE ISSUING A FINAL NEGATIVE REPORT Performed at [facility]    Report Status PENDING  Incomplete  Culture, blood (routine x 2)     Status: None (Preliminary result)   Collection Time: 12/05/2014  7:04 PM  Result Value Ref Range Status   Specimen Description  BLOOD RIGHT HAND  Final   Special Requests BOTTLES DRAWN AEROBIC AND ANAEROBIC  10CC  Final   Culture   Final    METHICILLIN RESISTANT STAPHYLOCOCCUS AUREUS Note: RIFAMPIN AND GENTAMICIN SHOULD NOT BE USED AS SINGLE DRUGS FOR TREATMENT OF STAPH INFECTIONS. CRITICAL RESULT CALLED TO, READ BACK BY AND VERIFIED WITH: Laurance Flatten 12/01/14 1245 BY SMITHERSJ Note: Culture results may be compromised due to an inadequate volume of blood received in culture bottles. Gram Stain Report Called to,Read Back By and Verified With: GRACE F@9 :57AM ON 11/30/14 BY DANTS Performed at [facility]    Report Status PENDING  Incomplete  MRSA PCR Screening     Status: Abnormal   Collection Time: 12/15/2014  5:50 AM  Result Value Ref Range Status   MRSA by PCR POSITIVE (A) NEGATIVE Final    Comment:  The GeneXpert MRSA Assay (FDA approved for NASAL specimens only), is one component of a comprehensive MRSA colonization surveillance program. It is not intended to diagnose MRSA infection nor to guide or monitor treatment for MRSA infections. RESULT CALLED TO, READ BACK BY AND VERIFIED WITH: H. HOLDERNESS RN AT 0710 ON 01.13.16 BY SHUEA   Clostridium Difficile by PCR     Status: Abnormal   Collection Time: 11/23/2014  5:53 PM  Result Value Ref Range Status   C difficile by pcr POSITIVE (A) NEGATIVE Final    Comment: CRITICAL RESULT CALLED TO, READ BACK BY AND VERIFIED WITH: SISON RN 11:10 11/30/14 ([email or web address]) Performed at [facility]      Studies: Dg Chest 1 View  12/01/2014   CLINICAL DATA:  Initial encounter for right thoracentesis  EXAM: CHEST - 1 VIEW  COMPARISON:  11/30/2014  FINDINGS: 1044 hrs. Patient is status post right thoracentesis. Right pleural effusion appears decreased in the interval. No evidence for right-sided pneumothorax. Bibasilar collapse/ consolidation persists in a tiny left pleural effusion is evident. Left PICC line tip overlies the distal SVC. Telemetry leads  overlie the chest. Old posterior right rib fracture again noted.  IMPRESSION: No evidence for right pneumothorax after thoracentesis.   Electronically Signed   By: Misty Stanley M.D.   On: 12/01/2014 11:24   US Thoracentesis Asp Pleural Space W/img Guide  12/01/2014   INDICATION: Symptomatic right sided pleural effusion  EXAM: US THORACENTESIS ASP PLEURAL SPACE W/IMG GUIDE  COMPARISON:  09/20/14 thoracentesis.  MEDICATIONS: None  COMPLICATIONS: None immediate  TECHNIQUE: Informed written consent was obtained from the patient after a discussion of the risks, benefits and alternatives to treatment. A timeout was performed prior to the initiation of the procedure.  Initial ultrasound scanning demonstrates a right pleural effusion. The lower chest was prepped and draped in the usual sterile fashion. 1% lidocaine was used for local anesthesia.  Under direct ultrasound guidance, a 19 gauge, 10-cm, Yueh catheter was introduced. An ultrasound image was saved for documentation purposes. The thoracentesis was performed. The catheter was removed and a dressing was applied. The patient tolerated the procedure well without immediate post procedural complication. The patient was escorted to have an upright chest radiograph.  FINDINGS: A total of approximately 1.3 liters of cloudy serous fluid was removed. Requested samples were sent to the laboratory.  IMPRESSION: Successful ultrasound-guided right sided thoracentesis yielding 1.3 liters of pleural fluid.  Read By:  Tsosie Billing PA-C   Electronically Signed   By: Jacqulynn Cadet M.D.   On: 12/01/2014 10:40    Scheduled Meds: . albuterol  2.5 mg Nebulization QID  . calcium carbonate  1,250 mg Oral TID  . carvedilol  9.375 mg Oral BID WC  . Chlorhexidine Gluconate Cloth  6 each Topical Q0600  . escitalopram  10 mg Oral Daily  . furosemide  40 mg Oral Daily  . insulin aspart  0-5 Units Subcutaneous QHS  . insulin aspart  0-9 Units Subcutaneous TID WC  .  isosorbide-hydrALAZINE  1 tablet Oral BID  . mupirocin ointment  1 application Nasal BID  . pantoprazole  40 mg Oral Daily  . sodium chloride  3 mL Intravenous Q12H  . vancomycin  125 mg Oral 4 times per day  . vancomycin  1,000 mg Intravenous Q24H   Continuous Infusions: . heparin 1,650 Units/hr (12/01/14 1209)    Principal Problem:   MRSA bacteremia Active Problems:   Essential hypertension   Protein-calorie malnutrition, severe  Metastatic lung carcinoma   CRI (chronic renal insufficiency)   Diabetic foot ulcer associated with type 2 diabetes mellitus   DVT, lower extremity, proximal, chronic   DM (diabetes mellitus), type 1 with peripheral vascular complications   Chronic systolic heart failure   COPD (chronic obstructive pulmonary disease)   Cigarette smoker   Normocytic anemia   Thrombocytopenia   Depression   Anxiety   Infection of venous access Port-a-catheter s/p removal 11/29/2013   Enteritis due to Clostridium difficile    Time spent: 25 minutes    Tanishia Lemaster  [facility] Pager [phone]. If 7PM-7AM, please contact night-coverage at [email or web address], password [facility] 12/01/2014, 1:39 PM  LOS: 4 days

## 2014-12-01 NOTE — Progress Notes (Signed)
CSW confirmed with Shontel at [facility] that they would be able to take patient back over the weekend if ready. Please call weekend CSW, Rollene Fare (ph#: [phone]) to facilitate discharge.    Raynaldo Opitz, [facility] Clinical Social Worker cell #: [phone]

## 2014-12-01 NOTE — Telephone Encounter (Signed)
Received call from [facility] rehab stating he was admitted after seeing oncologist.

## 2014-12-01 NOTE — Progress Notes (Signed)
Patient ID: Ralph [name], male   DOB: 02/16/1951, 64 y.o.   MRN: [id number]         [facility]    Date of Admission:  12/05/2014           Day 7 IV vancomycin        Day 2 oral vancomycin  Principal Problem:   MRSA bacteremia Active Problems:   Essential hypertension   Protein-calorie malnutrition, severe   Metastatic lung carcinoma   CRI (chronic renal insufficiency)   Diabetic foot ulcer associated with type 2 diabetes mellitus   DVT, lower extremity, proximal, chronic   DM (diabetes mellitus), type 1 with peripheral vascular complications   Chronic systolic heart failure   COPD (chronic obstructive pulmonary disease)   Cigarette smoker   Normocytic anemia   Thrombocytopenia   Depression   Anxiety   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 albuterol  2.5 mg Nebulization QID  . calcium carbonate  1,250 mg Oral TID  . carvedilol  9.375 mg Oral BID WC  . Chlorhexidine Gluconate Cloth  6 each Topical Q0600  . escitalopram  10 mg Oral Daily  . furosemide  20 mg Intravenous Daily  . insulin aspart  0-5 Units Subcutaneous QHS  . insulin aspart  0-9 Units Subcutaneous TID WC  . isosorbide-hydrALAZINE  1 tablet Oral BID  . mupirocin ointment  1 application Nasal BID  . pantoprazole  40 mg Oral Daily  . sodium chloride  3 mL Intravenous Q12H  . vancomycin  125 mg Oral 4 times per day  . vancomycin  1,000 mg Intravenous Q24H    Subjective: He is feeling better. He states that his cough has improved and he feels stronger. He is still bothered by diarrhea. He denies nausea or vomiting.  Review of Systems: Pertinent items are noted in HPI.  Past Medical History  Diagnosis Date  . Allergy   . Anxiety     followed [facility] every six months.  . Hypertension   . Diabetes mellitus without complication   . Metastatic lung carcinoma 09/16/2014    History  Substance  Use Topics  . Smoking status: Current Some Day Smoker -- 0.75 packs/day for 36 years    Types: Cigarettes    Start date: 03/01/1979  . Smokeless tobacco: Never Used  . Alcohol Use: No    Family History  Problem Relation Age of Onset  . Heart disease Mother     AMI  . Cancer Sister     breast  . Heart disease Brother     AMI  . Cancer Brother   . Heart attack Mother   . Heart attack Brother   . Stroke Neg Hx    No Known Allergies  OBJECTIVE: Blood pressure 138/64, pulse 88, temperature 98.3 F (36.8 C), temperature source Oral, resp. rate 22, height 6\' 2"  (1.88 m), weight 210 lb 12.2 oz (95.6 kg), SpO2 97 %. General: He is alert and in good spirits Skin: Previous right chest Port-A-Cath site dressed Lungs: Clear Cor: Distant but regular S1 and S2 with no murmur Abdomen: Soft and nontender Rectal tube  Lab Results Lab Results  Component Value Date   WBC 15.4* 12/01/2014   HGB 9.3* 12/01/2014   HCT 28.6* 12/01/2014   MCV 89.4 12/01/2014   PLT 91* 12/01/2014    Lab Results  Component Value Date   CREATININE 2.60* 12/01/2014  BUN 64* 12/01/2014   NA 135 12/01/2014   K 4.4 12/01/2014   CL 103 12/01/2014   CO2 27 12/01/2014    Lab Results  Component Value Date   ALT 18 12/01/2014   AST 42* 12/01/2014   ALKPHOS 69 12/01/2014   BILITOT 0.6 12/01/2014     Microbiology: Recent Results (from the past 240 hour(s))  Culture, blood (routine x 2)     Status: None (Preliminary result)   Collection Time: 12/06/2014  7:00 PM  Result Value Ref Range Status   Specimen Description BLOOD LEFT ARM  Final   Special Requests BOTTLES DRAWN AEROBIC AND ANAEROBIC 10CC  Final   Culture   Final           BLOOD CULTURE RECEIVED NO GROWTH TO DATE CULTURE WILL BE HELD FOR 5 DAYS BEFORE ISSUING A FINAL NEGATIVE REPORT Performed at [facility]    Report Status PENDING  Incomplete  Culture, blood (routine x 2)     Status: None (Preliminary result)   Collection Time:  11/24/2014  7:04 PM  Result Value Ref Range Status   Specimen Description BLOOD RIGHT HAND  Final   Special Requests BOTTLES DRAWN AEROBIC AND ANAEROBIC  10CC  Final   Culture   Final    GRAM POSITIVE COCCI IN CLUSTERS Note: Culture results may be compromised due to an inadequate volume of blood received in culture bottles. Gram Stain Report Called to,Read Back By and Verified With: GRACE F@9 :57AM ON 11/30/14 BY DANTS Performed at [facility]    Report Status PENDING  Incomplete  MRSA PCR Screening     Status: Abnormal   Collection Time: 11/18/2014  5:50 AM  Result Value Ref Range Status   MRSA by PCR POSITIVE (A) NEGATIVE Final    Comment:        The GeneXpert MRSA Assay (FDA approved for NASAL specimens only), is one component of a comprehensive MRSA colonization surveillance program. It is not intended to diagnose MRSA infection nor to guide or monitor treatment for MRSA infections. RESULT CALLED TO, READ BACK BY AND VERIFIED WITH: H. HOLDERNESS RN AT 0710 ON 01.13.16 BY SHUEA   Clostridium Difficile by PCR     Status: Abnormal   Collection Time: 12/08/2014  5:53 PM  Result Value Ref Range Status   C difficile by pcr POSITIVE (A) NEGATIVE Final    Comment: CRITICAL RESULT CALLED TO, READ BACK BY AND VERIFIED WITH: SISON RN 11:10 11/30/14 ([email or web address]) Performed at [facility]     Assessment: He is improving on therapy for MRSA bacteremia and C. difficile colitis. Given his frailty I do not think that he would be a candidate for a short course of IV vancomycin even if a TEE was negative. I will plan on 3 more weeks of IV vancomycin.  Plan: 1. Continue IV vancomycin for 3 more weeks 2. Continue oral vancomycin 3. I will follow-up on Monday, 12/04/2014  Michel Bickers, MD [facility] [phone] pager   [phone] cell 12/01/2014, 8:59 AM

## 2014-12-01 NOTE — Progress Notes (Signed)
2 Days Post-Op  Subjective: I changed the dressings, both were a little bloody and purulent.  After removing dressing sites look good.  Objective: Vital signs in last 24 hours: Temp:  [98.1 F (36.7 C)-98.9 F (37.2 C)] 98.3 F (36.8 C) (01/15 0628) Pulse Rate:  [88-94] 88 (01/15 0628) Resp:  [18-22] 22 (01/15 0628) BP: (106-138)/(45-73) 106/45 mmHg (01/15 1034) SpO2:  [95 %-100 %] 96 % (01/15 1128) Weight:  [95.6 kg (210 lb 12.2 oz)] 95.6 kg (210 lb 12.2 oz) (01/15 0628) Last BM Date: 11/30/14  Intake/Output from previous day: 01/14 0701 - 01/15 0700 In: 997.7 [I.V.:317.7; Blood:680] Out: 800 [Urine:800] Intake/Output this shift: Total I/O In: 453.4 [P.O.:120; I.V.:133.4; IV Piggyback:200] Out: 100 [Urine:100]  General appearance: alert, cooperative, no distress and thick productive cough Chest wall: no tenderness, port site looks clean after old dressing removed, no significant cellulitis, neck site OK   Lab Results:   Recent Labs  11/30/14 0620 11/30/14 2246 12/01/14 0615  WBC 15.4*  --  15.4*  HGB 7.7* 8.7* 9.3*  HCT 23.5* 26.3* 28.6*  PLT 84*  --  91*    BMET  Recent Labs  11/30/14 0620 12/01/14 0615  NA 139 135  K 4.8 4.4  CL 102 103  CO2 29 27  GLUCOSE 71 73  BUN 67* 64*  CREATININE 2.60* 2.60*  CALCIUM 7.5* 7.0*   PT/INR  Recent Labs  11/30/14 0620 12/01/14 0615  LABPROT 15.8* 15.0  INR 1.25 1.17     Recent Labs Lab 12/04/2014 0917 11/28/14 0305 11/30/14 0620 12/01/14 0615  AST 29 28 38* 42*  ALT 12 15 16 18   ALKPHOS 66 66 63 69  BILITOT 0.40 0.4 0.6 0.6  PROT 5.7* 5.2* 4.7* 4.8*  ALBUMIN  --  1.5* 1.3* 1.2*     Lipase  No results found for: LIPASE   Studies/Results: Dg Chest 1 View  12/01/2014   CLINICAL DATA:  Initial encounter for right thoracentesis  EXAM: CHEST - 1 VIEW  COMPARISON:  12/17/2014  FINDINGS: 1044 hrs. Patient is status post right thoracentesis. Right pleural effusion appears decreased in the interval. No  evidence for right-sided pneumothorax. Bibasilar collapse/ consolidation persists in a tiny left pleural effusion is evident. Left PICC line tip overlies the distal SVC. Telemetry leads overlie the chest. Old posterior right rib fracture again noted.  IMPRESSION: No evidence for right pneumothorax after thoracentesis.   Electronically Signed   By: Misty Stanley M.D.   On: 12/01/2014 11:24   US Thoracentesis Asp Pleural Space W/img Guide  12/01/2014   INDICATION: Symptomatic right sided pleural effusion  EXAM: US THORACENTESIS ASP PLEURAL SPACE W/IMG GUIDE  COMPARISON:  09/20/14 thoracentesis.  MEDICATIONS: None  COMPLICATIONS: None immediate  TECHNIQUE: Informed written consent was obtained from the patient after a discussion of the risks, benefits and alternatives to treatment. A timeout was performed prior to the initiation of the procedure.  Initial ultrasound scanning demonstrates a right pleural effusion. The lower chest was prepped and draped in the usual sterile fashion. 1% lidocaine was used for local anesthesia.  Under direct ultrasound guidance, a 19 gauge, 10-cm, Yueh catheter was introduced. An ultrasound image was saved for documentation purposes. The thoracentesis was performed. The catheter was removed and a dressing was applied. The patient tolerated the procedure well without immediate post procedural complication. The patient was escorted to have an upright chest radiograph.  FINDINGS: A total of approximately 1.3 liters of cloudy serous fluid was removed. Requested  samples were sent to the laboratory.  IMPRESSION: Successful ultrasound-guided right sided thoracentesis yielding 1.3 liters of pleural fluid.  Read By:  Tsosie Billing PA-C   Electronically Signed   By: Jacqulynn Cadet M.D.   On: 12/01/2014 10:40    Medications: . albuterol  2.5 mg Nebulization QID  . calcium carbonate  1,250 mg Oral TID  . carvedilol  9.375 mg Oral BID WC  . Chlorhexidine Gluconate Cloth  6 each Topical  Q0600  . escitalopram  10 mg Oral Daily  . furosemide  40 mg Oral Daily  . insulin aspart  0-5 Units Subcutaneous QHS  . insulin aspart  0-9 Units Subcutaneous TID WC  . isosorbide-hydrALAZINE  1 tablet Oral BID  . mupirocin ointment  1 application Nasal BID  . pantoprazole  40 mg Oral Daily  . sodium chloride  3 mL Intravenous Q12H  . vancomycin  125 mg Oral 4 times per day  . vancomycin  1,000 mg Intravenous Q24H    Assessment/Plan  1.  MRSA bacteremia with infected porta cath 2.  Porta cath removal 11/21/2014, Dr. Johney Maine 3. Metastatic adenocarcinoma on chemotherapy, immunocompromised.  4. Acute renal failure 5. Type II diabetes 6. Systolic CHF, EF 19-75% 7. Hypertension 8. Hx of tobacco use 9.  C diff colitis 9. Anxiety and depression    Plan:  Continue to clean and redress site with packing to open site right chest.   LOS: 4 days    Alencia Gordon 12/01/2014

## 2014-12-01 NOTE — Progress Notes (Signed)
ANTICOAGULATION CONSULT NOTE  Pharmacy Consult for Heparin Indication: VTE treatment  No Known Allergies  Patient Measurements: Height: 6\' 2"  (188 cm) Weight: 210 lb 12.2 oz (95.6 kg) IBW/kg (Calculated) : 82.2 Heparin Dosing Weight: actual weight  Vital Signs: Temp: 98.3 F (36.8 C) (01/15 0628) Temp Source: Oral (01/15 0628) BP: 138/64 mmHg (01/15 0628) Pulse Rate: 88 (01/15 0628)  Labs:  Recent Labs  11/28/14 1000  11/22/2014 0355 11/30/14 0620 11/30/14 1700 11/30/14 2246 12/01/14 0056 12/01/14 0615  HGB  --   < > 8.4* 7.7*  --  8.7*  --  9.3*  HCT  --   < > 26.6* 23.5*  --  26.3*  --  28.6*  PLT  --   --  105* 84*  --   --   --  91*  APTT 62*  --   --  42*  --   --   --   --   LABPROT  --   --   --  15.8*  --   --   --  15.0  INR  --   --   --  1.25  --   --   --  1.17  HEPARINUNFRC  --   --   --  0.40 0.41  --  0.25*  --   CREATININE  --   --  2.72* 2.60*  --   --   --   --   < > = values in this interval not displayed.  Estimated Creatinine Clearance: 34.3 mL/min (by C-G formula based on Cr of 2.6).  Medications:  Infusions:  . heparin 1,600 Units/hr (12/01/14 0232)    Assessment: 31 YOM admitted 1/11 with metastatic lung cancer and history of LLE DVT with possible upper extremity DVT. He has been on Xarelto at [facility] rehab, initially 15mg  BID started on 12/22 with plan to change to 20mg  once daily dosing on 1/12, but on 1/4, order was restarted as Xarelto 15 mg BID x21d with change to 20mg  daily on 1/26.  Pharmacy is now consulted to dose IV Heparin for VTE.    Significant events: 1/11 Xarelto continued 1/12 Last Xarelto dose given ~ 8am.  Plan to change from Xarelto to Heparin, but start held d/t recent Xarelto and worsening renal function. 1/13 Heparin initiation held for infected PAC removed.  PICC line inserted. 1/14 Pharmacy is re-consulted to dose IV Heparin   Today, 12/01/2014  INR 1.17  aPTT 70  Heparin level 0.29, just slightly  subtherapeutic level on heparin at 16 ml/hr.  Recent Xarelto use has the potential to falsely elevate heparin level, but with last dose about 72 hours ago, effect has likely dissipated.  CBC: Hgb improved to 9.3 s/p transfusion.  Plt remain low/stable at 91.  No active bleeding reported today.  RN to monitor and report any bleeding from [facility] removal site during dressing change (bleeding reported on 1/13)  SCr 2.6, CrCl ~ 34 ml/min   Goal of Therapy:  Heparin level 0.3-0.7 units/ml aPTT 1.5-2.5 times control (~45-75 seconds) Monitor platelets by anticoagulation protocol: Yes   Plan:   Increase to Heparin IV infusion at 1650 units/hr.  Heparin level 8 hours after rate change.  Daily heparin level, APTT, and CBC  Gretta Arab PharmD, BCPS Pager [phone] 12/01/2014 7:43 AM

## 2014-12-01 NOTE — Progress Notes (Signed)
ANTICOAGULATION CONSULT NOTE - F/u Consult  Pharmacy Consult for Heparin Indication: VTE treatment  No Known Allergies  Patient Measurements: Height: 6\' 2"  (188 cm) Weight: 208 lb 12.4 oz (94.7 kg) IBW/kg (Calculated) : 82.2 Heparin Dosing Weight: actual weight  Vital Signs: Temp: 98.1 F (36.7 C) (01/14 2040) Temp Source: Oral (01/14 2040) BP: 127/73 mmHg (01/14 2040) Pulse Rate: 90 (01/14 2040)  Labs:  Recent Labs  11/28/14 0305 11/28/14 0610 11/28/14 1000 12/12/2014 0355 11/30/14 0620 11/30/14 1700 11/30/14 2246 12/01/14 0056  HGB 8.6*  --   --  8.4* 7.7*  --  8.7*  --   HCT 27.3*  --   --  26.6* 23.5*  --  26.3*  --   PLT 118*  --   --  105* 84*  --   --   --   APTT  --   --  62*  --  42*  --   --   --   LABPROT  --   --   --   --  15.8*  --   --   --   INR  --   --   --   --  1.25  --   --   --   HEPARINUNFRC  --   --   --   --  0.40 0.41  --  0.25*  CREATININE 2.86*  --   --  2.72* 2.60*  --   --   --   TROPONINI  --  0.06*  --   --   --   --   --   --     Estimated Creatinine Clearance: 34.3 mL/min (by C-G formula based on Cr of 2.6).  Medications:  Infusions:  . heparin      Assessment: 19 YOM admitted 1/11 with metastatic lung cancer and history of LLE DVT with possible upper extremity DVT. He has been on Xarelto at [facility] rehab, initially 15mg  BID started on 12/22 with plan to change to 20mg  once daily dosing on 1/12, but on 1/4, order was restarted as Xarelto 15 mg BID x21d with change to 20mg  daily on 1/26.    Significant events: 1/11 Continued Xarelto 1/12 Last Xarelto dose given ~ 8am.  Plan to change from Xarelto to Heparin, but start held d/t recent Xarelto and worsening renal function. 1/13 Heparin initiation held for infected PAC removed.  PICC line inserted. 1/14 Pharmacy is re-consulted to dose IV Heparin    12/01/2014  INR 1.25  aPTT 62  Heparin level 0.4, level elevated despite NO heparin infusion d/t recent Xarelto use  CBC:  Hgb decreased to 7.7, Plt decreased to 84  No active bleeding reported today.  RN reports bleeding from [facility] removal site on 1/13.  Orders to transfuse 2 units PRBC  SCr 2.6, improving with CrCl ~ 34 ml/min  1700 HL=0.41, pt received x2 units of blood.   Today, 12/02/2014  0100 HL=0.25, no problems per RN, Hgb=8.7, Plts=84 now s/p 2 units.   Goal of Therapy:  Heparin level 0.3-0.7 units/ml aPTT 1.5-2.5 times control (~45-75 seconds) Monitor platelets by anticoagulation protocol: Yes   Plan:   Increase heparin drip to 1600 units/hr  Check HL in 6 hours  Daily heparin level, CBC. F/u Hgb/plts/s/s of bleeding   Dorrene German 12/01/2014 2:15 AM

## 2014-12-01 NOTE — Procedures (Signed)
Successful US guided right thoracentesis. Yielded 1.3 liters of cloudy yellow fluid. Pt tolerated procedure well. No immediate complications.  Specimen was sent for labs. CXR ordered.  Tsosie Billing D PA-C 12/01/2014 10:32 AM

## 2014-12-02 LAB — COMPREHENSIVE METABOLIC PANEL
ALBUMIN: 1.2 g/dL — AB (ref 3.5–5.2)
ALK PHOS: 75 U/L (ref 39–117)
ALT: 17 U/L (ref 0–53)
AST: 43 U/L — AB (ref 0–37)
Anion gap: 10 (ref 5–15)
BUN: 64 mg/dL — ABNORMAL HIGH (ref 6–23)
CHLORIDE: 99 meq/L (ref 96–112)
CO2: 26 mmol/L (ref 19–32)
Calcium: 7.2 mg/dL — ABNORMAL LOW (ref 8.4–10.5)
Creatinine, Ser: 2.56 mg/dL — ABNORMAL HIGH (ref 0.50–1.35)
GFR calc Af Amer: 29 mL/min — ABNORMAL LOW (ref >90)
GFR calc non Af Amer: 25 mL/min — ABNORMAL LOW (ref >90)
GLUCOSE: 90 mg/dL (ref 70–99)
Potassium: 4.1 mmol/L (ref 3.5–5.1)
SODIUM: 135 mmol/L (ref 135–145)
Total Bilirubin: 0.8 mg/dL (ref 0.3–1.2)
Total Protein: 4.6 g/dL — ABNORMAL LOW (ref 6.0–8.3)

## 2014-12-02 LAB — HEPARIN LEVEL (UNFRACTIONATED): Heparin Unfractionated: 0.35 IU/mL (ref 0.30–0.70)

## 2014-12-02 LAB — CBC
HCT: 27.1 % — ABNORMAL LOW (ref 39.0–52.0)
Hemoglobin: 8.9 g/dL — ABNORMAL LOW (ref 13.0–17.0)
MCH: 29.5 pg (ref 26.0–34.0)
MCHC: 32.8 g/dL (ref 30.0–36.0)
MCV: 89.7 fL (ref 78.0–100.0)
Platelets: 86 10*3/uL — ABNORMAL LOW (ref 150–400)
RBC: 3.02 MIL/uL — ABNORMAL LOW (ref 4.22–5.81)
RDW: 20.9 % — ABNORMAL HIGH (ref 11.5–15.5)
WBC: 13.2 10*3/uL — ABNORMAL HIGH (ref 4.0–10.5)

## 2014-12-02 LAB — GLUCOSE, CAPILLARY
GLUCOSE-CAPILLARY: 79 mg/dL (ref 70–99)
GLUCOSE-CAPILLARY: 72 mg/dL (ref 70–99)
GLUCOSE-CAPILLARY: 83 mg/dL (ref 70–99)
Glucose-Capillary: 110 mg/dL — ABNORMAL HIGH (ref 70–99)

## 2014-12-02 LAB — VANCOMYCIN, TROUGH: Vancomycin Tr: 30.3 ug/mL (ref 10.0–20.0)

## 2014-12-02 LAB — CULTURE, BLOOD (ROUTINE X 2)

## 2014-12-02 MED ORDER — TRAMADOL HCL 50 MG PO TABS
50.0000 mg | ORAL_TABLET | Freq: Four times a day (QID) | ORAL | Status: DC | PRN
  Administered 2014-12-05: 50 mg via ORAL
  Filled 2014-12-02: qty 1

## 2014-12-02 MED ORDER — MORPHINE SULFATE 2 MG/ML IJ SOLN
1.0000 mg | INTRAMUSCULAR | Status: DC | PRN
  Administered 2014-12-02 – 2014-12-07 (×2): 1 mg via INTRAVENOUS
  Filled 2014-12-02 (×2): qty 1

## 2014-12-02 MED ORDER — NICOTINE 7 MG/24HR TD PT24
7.0000 mg | MEDICATED_PATCH | Freq: Every day | TRANSDERMAL | Status: DC
  Administered 2014-12-02 – 2014-12-06 (×5): 7 mg via TRANSDERMAL
  Filled 2014-12-02 (×7): qty 1

## 2014-12-02 NOTE — Consult Note (Signed)
WOC wound consult note Reason for Consult:Patient known to me from last admission, seen on 11/28/14.  Family in room, are able to provide additional details.  They state that right great toe has been dark and necrotic for longer than 1 month (not two weeks as was previously reported by patient.  Edema of RLE is > than LLE today. Patient is wearing bilateral Prevalon Boots per last admission. Wound type:infectious vs arterial insufficiency Pressure Ulcer POA: No Measurement:left dorsal foot with 4cm x 3cm x 0.2cm partial thickness open area resulting from a ruptured blister. It has no dressing at this time.  The right great toe is necrotic and presents with not only dry hard eschar, but with soft, macerated skin with a purple hue today. Drainage (amount, consistency, odor) scant from both areas Periwound:As described above. Dressing procedure/placement/frequency:Suggest consideration of orthopedic or VVS consultation for viability of right great toe.  At this time, orders for a conservative dressing (xeroform) are in place.  I have added orders for the left dorsal foot. [facility] nursing team will not follow, but will remain available to this patient, the nursing and medical team.  Please re-consult if needed. Thanks, Maudie Flakes, MSN, RN, [place], [place], [place] [phone])   Thanks, Maudie Flakes, MSN, RN, [place], [place], [place] [phone])

## 2014-12-02 NOTE — Progress Notes (Signed)
ANTIBIOTIC CONSULT NOTE - FOLLOW UP  Pharmacy Consult for Vancomycin Indication: MRSA bacteremia, diabetic foot infection  No Known Allergies  Patient Measurements: Height: 6\' 2"  (188 cm) Weight: 202 lb 2.6 oz (91.7 kg) IBW/kg (Calculated) : 82.2  Vital Signs: Temp: 98.4 F (36.9 C) (01/16 0634) Temp Source: Oral (01/16 0634) BP: 135/62 mmHg (01/16 0634) Pulse Rate: 89 (01/16 0634) Intake/Output from previous day: 01/15 0701 - 01/16 0700 In: 957.9 [P.O.:360; I.V.:397.9; IV Piggyback:200] Out: 1300 [Urine:650; Stool:650] Intake/Output from this shift: Total I/O In: 240 [P.O.:240] Out: 100 [Urine:100]  Labs:  Recent Labs  11/30/14 0620 11/30/14 2246 12/01/14 0615 12/02/14 0430  WBC 15.4*  --  15.4* 13.2*  HGB 7.7* 8.7* 9.3* 8.9*  PLT 84*  --  91* 86*  CREATININE 2.60*  --  2.60* 2.56*   Estimated Creatinine Clearance: 34.8 mL/min (by C-G formula based on Cr of 2.56).  Recent Labs  12/02/14 1100  VANCOTROUGH 30.3*     Microbiology: Recent Results (from the past 720 hour(s))  Culture, blood (routine x 2)     Status: None (Preliminary result)   Collection Time: 11/23/2014  7:00 PM  Result Value Ref Range Status   Specimen Description BLOOD LEFT ARM  Final   Special Requests BOTTLES DRAWN AEROBIC AND ANAEROBIC 10CC  Final   Culture   Final    GRAM POSITIVE COCCI IN CLUSTERS        BLOOD CULTURE RECEIVED NO GROWTH TO DATE CULTURE WILL BE HELD FOR 5 DAYS BEFORE ISSUING A FINAL NEGATIVE REPORT Note: Gram Stain Report Called to,Read Back By and Verified With: [place] 12/02/14 430AM [place] Performed at [facility]    Report Status PENDING  Incomplete  Culture, blood (routine x 2)     Status: None   Collection Time: 11/25/2014  7:04 PM  Result Value Ref Range Status   Specimen Description BLOOD RIGHT HAND  Final   Special Requests BOTTLES DRAWN AEROBIC AND ANAEROBIC  10CC  Final   Culture   Final    METHICILLIN RESISTANT STAPHYLOCOCCUS AUREUS Note:  RIFAMPIN AND GENTAMICIN SHOULD NOT BE USED AS SINGLE DRUGS FOR TREATMENT OF STAPH INFECTIONS. CRITICAL RESULT CALLED TO, READ BACK BY AND VERIFIED WITH: Laurance Flatten 12/01/14 1245 BY SMITHERSJ Note: Culture results may be compromised due to an inadequate volume of blood received in culture bottles. Gram Stain Report Called to,Read Back By and Verified With: GRACE F@9 :57AM ON 11/30/14 BY DANTS Performed at [facility]    Report Status 12/02/2014 FINAL  Final   Organism ID, Bacteria METHICILLIN RESISTANT STAPHYLOCOCCUS AUREUS  Final      Susceptibility   Methicillin resistant staphylococcus aureus - MIC*    CLINDAMYCIN >=8 RESISTANT Resistant     ERYTHROMYCIN >=8 RESISTANT Resistant     GENTAMICIN <=0.5 SENSITIVE Sensitive     LEVOFLOXACIN >=8 RESISTANT Resistant     OXACILLIN >=4 RESISTANT Resistant     PENICILLIN >=0.5 RESISTANT Resistant     RIFAMPIN <=0.5 SENSITIVE Sensitive     TRIMETH/SULFA <=10 SENSITIVE Sensitive     VANCOMYCIN 1 SENSITIVE Sensitive     TETRACYCLINE 2 SENSITIVE Sensitive     * METHICILLIN RESISTANT STAPHYLOCOCCUS AUREUS  MRSA PCR Screening     Status: Abnormal   Collection Time: 12/12/2014  5:50 AM  Result Value Ref Range Status   MRSA by PCR POSITIVE (A) NEGATIVE Final    Comment:        The GeneXpert MRSA Assay (FDA approved for  NASAL specimens only), is one component of a comprehensive MRSA colonization surveillance program. It is not intended to diagnose MRSA infection nor to guide or monitor treatment for MRSA infections. RESULT CALLED TO, READ BACK BY AND VERIFIED WITH: H. HOLDERNESS RN AT 0710 ON 01.13.16 BY SHUEA   Clostridium Difficile by PCR     Status: Abnormal   Collection Time: 12/13/2014  5:53 PM  Result Value Ref Range Status   C difficile by pcr POSITIVE (A) NEGATIVE Final    Comment: CRITICAL RESULT CALLED TO, READ BACK BY AND VERIFIED WITH: SISON RN 11:10 11/30/14 ([email or web address]) Performed at [facility]   Culture, blood  (routine x 2)     Status: None (Preliminary result)   Collection Time: 11/30/14 10:40 PM  Result Value Ref Range Status   Specimen Description BLOOD RIGHT ARM  Final   Special Requests BOTTLES DRAWN AEROBIC ONLY 7ML  Final   Culture   Final           BLOOD CULTURE RECEIVED NO GROWTH TO DATE CULTURE WILL BE HELD FOR 5 DAYS BEFORE ISSUING A FINAL NEGATIVE REPORT Performed at [facility]    Report Status PENDING  Incomplete  Culture, blood (routine x 2)     Status: None (Preliminary result)   Collection Time: 11/30/14 10:40 PM  Result Value Ref Range Status   Specimen Description BLOOD RIGHT FOREARM  Final   Special Requests BOTTLES DRAWN AEROBIC ONLY 4ML  Final   Culture   Final           BLOOD CULTURE RECEIVED NO GROWTH TO DATE CULTURE WILL BE HELD FOR 5 DAYS BEFORE ISSUING A FINAL NEGATIVE REPORT Performed at [facility]    Report Status PENDING  Incomplete    Assessment: 56 yoM admitted 11/28/2014 with progressive failure to thrive, LE edema, SOB. PMH includes end stage CHF (EF 25%), metastatic lung cancer, DM, anxiety, depression, DVT (recently on Xarelto), and worsening renal fxn. Empiric broad spectrum antibiotics are started with concern for bacteremia, diabetic foot infection, and/or PAC/PICC line infections. Pharmacy is consulted to continue dosing vancomycin. Port-cath/Picc line removed and another PICC line inserted 11/18/2014. Per ID, plan is to continue vancomycin x 3 more weeks.  [facility] Records of PTA antibiotics: Fluconazole 100mg  daily (per MAG, no stop date, prophylactic) Completed 10 day course of Levaquin 750mg  daily x10 days on 1/8 Vancomycin 1500mg  IV q24h started 1/9 with last dose 1/11 at 0400  (PTA 1/9 >>) 1/11 >> Vanc >> 1/11 >> Cefepime >> 1/13 (PTA) 1/11 >> Fluconazole >> 1/11 no doses inpt 1/14 >> metronidazole >> 1/14 1/14 >> PO Vancomycin >>  Tmax: AFebrile WBCs: stable, remains elevated at 13.2 Renal: SCr  remains elevated at 2.56, CrCl 34 ml/min CG  Outside hospital records 1/8 blood x 2: MRSA 2/2 (vanc MIC = 1) 1/8 urine: MRSA 1/8 Port: MRSA  Current admission 1/11 blood x2: MRSA 1/13 MRSA PCR: positive 1/13 Cdiff: Positive 1/14 blood x2: ngtd  Drug level / dose changes info: 1/12 0300 Vanc level = 29.6 mcg/mg before 4th dose on 1500mg  q24h per outpt records. Last dose 1500mg  1/11 at 0400 1/13 0500 random vanco = 21.4 1/16 1100 VT = 30.3 on vanc 1g q24 (prior to 4th dose on this regimen)   Goal of Therapy:  Vancomycin trough level 15-20 mcg/ml  Plan:   HOLD vancomycin  Recheck random level in ~36-48 hours  Resume dosing when level is < 15 mcg/ml  Peggyann Juba, PharmD, BCPS Pager: [phone] 12/02/2014,11:59 AM

## 2014-12-02 NOTE — Progress Notes (Signed)
CRITICAL VALUE ALERT  Critical value received: Vanc Trough 30.3  Date of notification: 12/02/2014   Time of notification: 1100a  Critical value read back:Yes.    Nurse who received alert:  Emmaline Kluver   MD notified (1st page):  Dr Sanjuana Letters  Time of first page:  1101a  MD notified (2nd page):  Time of second page:  Responding MD:  Dr Sanjuana Letters  Time MD responded:  [phone]

## 2014-12-02 NOTE — Progress Notes (Signed)
[facility] PROGRESS NOTE  Ralph [name] ZOX:[id number] DOB: July 23, 1951 DOA: 11/26/2014 PCP: No PCP Per Patient  Summary/Subjective: Appreciate Gen surgery/ID/Oncology/IR. Ralph [name] is a pleasant 64 y.o. male with a complex medical history including diabetes mellitus type 2, anxiety and depression, systolic heart failure with an EF of 25% back in October 2015,metastatic lung cancer on chemotherapy(now discontinued), who came in for generalized weakness and was found to have MRSA bacteremia(BC on 11/24/13). There was concern for port-cath/picc line infection, hence removal of both done. Blood cultures sent on 11/27/2013 growing gram-positive cocci. Patient also had stool for C. difficile PCR send on 11/29/13 and it is positive. His hemoglobin dropped to 7.7 g/dL prompting PRBC transfusion per Oncology(improved to 9.3 g/dL). He is on anticoagulation with IV heparin. Patient is on IV(to complete 3 weeks) and oral vancomycin per ID. He had 2-D echocardiogram which showed EF 45%, trace pericardial effusion and large pleural effusion and had ultrasound-guided drainage(1.3L of yellow colored fluid drained and labs pending). 2-D echo showed No obvious vegetations. Patient reports that diarrhea and cough have improved. Plan is to continue current management including antibiotics, and IV heparin drip per pharmacy. Will defer to hematology for management of anemia and to ID for management of MRSA/CDiff. Patient denies any complaints today. He tells me he wants to eventually go home. He would like for me to speak with his son but states that the son is at a wedding today so I can talk to him tomorrow. Discussed with nursing regarding dressing changes. Will consult wound care. Will also add morphine/ultra for pain control. Plan MRSA bacteremia/Right great toe ulcer/ Diabetic foot ulcer associated with type 2 diabetes mellitus/C. difficile colitis/large pleural effusion  On Vancomycin per pharmacy  Defer  antibiotic management to ID.   Port-cath/Picc line removed and another PICC line inserted.  Reviewed pleural fluid analysis.  Repeat blood cultures on 11/27/2013 positive for MRSA. Acute systolic congestive heart failure/?Cardiorenal syndrome: - Continue Lasix oral as patient continues to make progress - Poor UOP. Restrict fluid, low sodium diet. Strict I and O's. - monitor electrolytes. DVT, lower extremity, proximal, chronic/Swollen right arm. - Iv heparin for now per pharmacy. Appreciate help.  - GFR < 30 hence Xarelto not ideal, use heparin DM (diabetes mellitus), type 1 with peripheral vascular complications - Sugars stable. - Cont SSI. Acute renal failure syndrome - No acute changes. Monitor. Metastatic lung carcinoma/anemia/thrombocytopenia - Defer to Oncology - Monitor hemoglobin Protein-calorie malnutrition, severe - Ensure TID Code Status: Full code Family Communication: Spoke to patient at bedside. Disposition Plan: Patient says he wants to go home but I'm not sure about his support system. When therefore contact is son to get a better understanding of the home situation. Consultants:  ID/Surgery/Oncology/I is a hospital and light during the normal R  Procedures: Right foot MRI-Severe diffuse cellulitis and myofasciitis but no definite soft tissue abscess, septic arthritis or osteomyelitis  Antibiotics: Vancomycin 11/26/13> Oral Vancomycin 11/30/13> Objective: Filed Vitals:   12/02/14 0634  BP: 135/62  Pulse: 89  Temp: 98.4 F (36.9 C)  Resp: 20    Intake/Output Summary (Last 24 hours) at 12/02/14 1312 Last data filed at 12/02/14 0911  Gross per 24 hour  Intake 747.93 ml  Output   1300 ml  Net -552.07 ml   Filed Weights   11/30/14 0524 12/01/14 0628 12/02/14 0500  Weight: 94.7 kg (208 lb 12.4 oz) 95.6 kg (210 lb 12.2 oz) 91.7 kg (202 lb 2.6 oz)    Exam:  General:  Not in distress  Cardiovascular: S1S2 heard. Normal. No  murmurs.  Respiratory: Occasional cough. Good air entry bilaterally.  Abdomen: Soft and nontender. Normal bowel sounds.  Musculoskeletal: Less edema. No drainage from both feet.   Data Reviewed: Basic Metabolic Panel:  Recent Labs Lab 11/28/14 0305 12/10/2014 0355 11/30/14 0620 12/01/14 0615 12/02/14 0430  NA 139 139 139 135 135  K 4.9 4.8 4.8 4.4 4.1  CL 103 104 102 103 99  CO2 27 26 29 27 26   GLUCOSE 218* 121* 71 73 90  BUN 70* 67* 67* 64* 64*  CREATININE 2.86* 2.72* 2.60* 2.60* 2.56*  CALCIUM 7.5* 7.5* 7.5* 7.0* 7.2*   Liver Function Tests:  Recent Labs Lab 11/25/2014 0917 11/28/14 0305 11/30/14 0620 12/01/14 0615 12/02/14 0430  AST 29 28 38* 42* 43*  ALT 12 15 16 18 17   ALKPHOS 66 66 63 69 75  BILITOT 0.40 0.4 0.6 0.6 0.8  PROT 5.7* 5.2* 4.7* 4.8* 4.6*  ALBUMIN  --  1.5* 1.3* 1.2* 1.2*   No results for input(s): LIPASE, AMYLASE in the last 168 hours. No results for input(s): AMMONIA in the last 168 hours. CBC:  Recent Labs Lab 12/14/2014 0917  12/05/2014 1904 11/28/14 0305 11/21/2014 0355 11/30/14 0620 11/30/14 2246 12/01/14 0615 12/02/14 0430  WBC 16.2*  < > 20.4* 16.8* 15.9* 15.4*  --  15.4* 13.2*  NEUTROABS 14.8*  --  19.6*  --   --   --   --   --   --   HGB 9.1*  < > 9.5* 8.6* 8.4* 7.7* 8.7* 9.3* 8.9*  HCT 28.3*  < > 29.7* 27.3* 26.6* 23.5* 26.3* 28.6* 27.1*  MCV 90  < > 89.5 90.1 90.5 89.4  --  89.4 89.7  PLT 130*  < > 123* 118* 105* 84*  --  91* 86*  < > = values in this interval not displayed. Cardiac Enzymes:  Recent Labs Lab 12/03/2014 1904 11/28/14 11/28/14 0610  TROPONINI 0.07* 0.06* 0.06*   BNP (last 3 results)  Recent Labs  09/12/14 0620 11/23/14 1619  PROBNP 8587.0* 409.0*   CBG:  Recent Labs Lab 12/01/14 1132 12/01/14 1705 12/01/14 2228 12/02/14 0753 12/02/14 1224  GLUCAP 80 101* 77 79 72    Recent Results (from the past 240 hour(s))  Culture, blood (routine x 2)     Status: None (Preliminary result)   Collection  Time: 11/21/2014  7:00 PM  Result Value Ref Range Status   Specimen Description BLOOD LEFT ARM  Final   Special Requests BOTTLES DRAWN AEROBIC AND ANAEROBIC 10CC  Final   Culture   Final    GRAM POSITIVE COCCI IN CLUSTERS        BLOOD CULTURE RECEIVED NO GROWTH TO DATE CULTURE WILL BE HELD FOR 5 DAYS BEFORE ISSUING A FINAL NEGATIVE REPORT Note: Gram Stain Report Called to,Read Back By and Verified With: [facility] ADAMSON 12/02/14 430AM [place] Performed at [facility]    Report Status PENDING  Incomplete  Culture, blood (routine x 2)     Status: None   Collection Time: 12/09/2014  7:04 PM  Result Value Ref Range Status   Specimen Description BLOOD RIGHT HAND  Final   Special Requests BOTTLES DRAWN AEROBIC AND ANAEROBIC  10CC  Final   Culture   Final    METHICILLIN RESISTANT STAPHYLOCOCCUS AUREUS Note: RIFAMPIN AND GENTAMICIN SHOULD NOT BE USED AS SINGLE DRUGS FOR TREATMENT OF STAPH INFECTIONS. CRITICAL RESULT CALLED TO, READ BACK  BY AND VERIFIED WITH: Laurance Flatten 12/01/14 1245 BY SMITHERSJ Note: Culture results may be compromised due to an inadequate volume of blood received in culture bottles. Gram Stain Report Called to,Read Back By and Verified With: GRACE F@9 :57AM ON 11/30/14 BY DANTS Performed at [facility]    Report Status 12/02/2014 FINAL  Final   Organism ID, Bacteria METHICILLIN RESISTANT STAPHYLOCOCCUS AUREUS  Final      Susceptibility   Methicillin resistant staphylococcus aureus - MIC*    CLINDAMYCIN >=8 RESISTANT Resistant     ERYTHROMYCIN >=8 RESISTANT Resistant     GENTAMICIN <=0.5 SENSITIVE Sensitive     LEVOFLOXACIN >=8 RESISTANT Resistant     OXACILLIN >=4 RESISTANT Resistant     PENICILLIN >=0.5 RESISTANT Resistant     RIFAMPIN <=0.5 SENSITIVE Sensitive     TRIMETH/SULFA <=10 SENSITIVE Sensitive     VANCOMYCIN 1 SENSITIVE Sensitive     TETRACYCLINE 2 SENSITIVE Sensitive     * METHICILLIN RESISTANT STAPHYLOCOCCUS AUREUS  MRSA PCR Screening     Status:  Abnormal   Collection Time: 12/04/2014  5:50 AM  Result Value Ref Range Status   MRSA by PCR POSITIVE (A) NEGATIVE Final    Comment:        The GeneXpert MRSA Assay (FDA approved for NASAL specimens only), is one component of a comprehensive MRSA colonization surveillance program. It is not intended to diagnose MRSA infection nor to guide or monitor treatment for MRSA infections. RESULT CALLED TO, READ BACK BY AND VERIFIED WITH: H. HOLDERNESS RN AT 0710 ON 01.13.16 BY SHUEA   Clostridium Difficile by PCR     Status: Abnormal   Collection Time: 11/20/2014  5:53 PM  Result Value Ref Range Status   C difficile by pcr POSITIVE (A) NEGATIVE Final    Comment: CRITICAL RESULT CALLED TO, READ BACK BY AND VERIFIED WITH: SISON RN 11:10 11/30/14 ([email or web address]) Performed at [facility]   Culture, blood (routine x 2)     Status: None (Preliminary result)   Collection Time: 11/30/14 10:40 PM  Result Value Ref Range Status   Specimen Description BLOOD RIGHT ARM  Final   Special Requests BOTTLES DRAWN AEROBIC ONLY 7ML  Final   Culture   Final           BLOOD CULTURE RECEIVED NO GROWTH TO DATE CULTURE WILL BE HELD FOR 5 DAYS BEFORE ISSUING A FINAL NEGATIVE REPORT Performed at [facility]    Report Status PENDING  Incomplete  Culture, blood (routine x 2)     Status: None (Preliminary result)   Collection Time: 11/30/14 10:40 PM  Result Value Ref Range Status   Specimen Description BLOOD RIGHT FOREARM  Final   Special Requests BOTTLES DRAWN AEROBIC ONLY 4ML  Final   Culture   Final           BLOOD CULTURE RECEIVED NO GROWTH TO DATE CULTURE WILL BE HELD FOR 5 DAYS BEFORE ISSUING A FINAL NEGATIVE REPORT Performed at [facility]    Report Status PENDING  Incomplete     Studies: Dg Chest 1 View  12/01/2014   CLINICAL DATA:  Initial encounter for right thoracentesis  EXAM: CHEST - 1 VIEW  COMPARISON:  12/09/2014  FINDINGS: 1044 hrs. Patient is status post right thoracentesis.  Right pleural effusion appears decreased in the interval. No evidence for right-sided pneumothorax. Bibasilar collapse/ consolidation persists in a tiny left pleural effusion is evident. Left PICC line tip overlies the distal SVC. Telemetry leads  overlie the chest. Old posterior right rib fracture again noted.  IMPRESSION: No evidence for right pneumothorax after thoracentesis.   Electronically Signed   By: Misty Stanley M.D.   On: 12/01/2014 11:24   US Thoracentesis Asp Pleural Space W/img Guide  12/01/2014   INDICATION: Symptomatic right sided pleural effusion  EXAM: US THORACENTESIS ASP PLEURAL SPACE W/IMG GUIDE  COMPARISON:  09/20/14 thoracentesis.  MEDICATIONS: None  COMPLICATIONS: None immediate  TECHNIQUE: Informed written consent was obtained from the patient after a discussion of the risks, benefits and alternatives to treatment. A timeout was performed prior to the initiation of the procedure.  Initial ultrasound scanning demonstrates a right pleural effusion. The lower chest was prepped and draped in the usual sterile fashion. 1% lidocaine was used for local anesthesia.  Under direct ultrasound guidance, a 19 gauge, 10-cm, Yueh catheter was introduced. An ultrasound image was saved for documentation purposes. The thoracentesis was performed. The catheter was removed and a dressing was applied. The patient tolerated the procedure well without immediate post procedural complication. The patient was escorted to have an upright chest radiograph.  FINDINGS: A total of approximately 1.3 liters of cloudy serous fluid was removed. Requested samples were sent to the laboratory.  IMPRESSION: Successful ultrasound-guided right sided thoracentesis yielding 1.3 liters of pleural fluid.  Read By:  Tsosie Billing PA-C   Electronically Signed   By: Jacqulynn Cadet M.D.   On: 12/01/2014 10:40    Scheduled Meds: . albuterol  2.5 mg Nebulization QID  . calcium carbonate  1,250 mg Oral TID  . carvedilol  9.375 mg  Oral BID WC  . Chlorhexidine Gluconate Cloth  6 each Topical Q0600  . escitalopram  10 mg Oral Daily  . furosemide  40 mg Oral Daily  . insulin aspart  0-5 Units Subcutaneous QHS  . insulin aspart  0-9 Units Subcutaneous TID WC  . isosorbide-hydrALAZINE  1 tablet Oral BID  . mupirocin ointment  1 application Nasal BID  . nicotine  7 mg Transdermal Daily  . pantoprazole  40 mg Oral Daily  . sodium chloride  3 mL Intravenous Q12H  . vancomycin  125 mg Oral 4 times per day   Continuous Infusions: . heparin 1,650 Units/hr (12/02/14 1111)    Principal Problem:   MRSA bacteremia Active Problems:   Essential hypertension   Protein-calorie malnutrition, severe   Metastatic lung carcinoma   CRI (chronic renal insufficiency)   Diabetic foot ulcer associated with type 2 diabetes mellitus   DVT, lower extremity, proximal, chronic   DM (diabetes mellitus), type 1 with peripheral vascular complications   Chronic systolic heart failure   COPD (chronic obstructive pulmonary disease)   Cigarette smoker   Normocytic anemia   Thrombocytopenia   Depression   Anxiety   Infection of venous access Port-a-catheter s/p removal 11/29/2013   Enteritis due to Clostridium difficile    Time spent: 25 minutes.    Idamae Coccia  [facility] Pager [phone]. If 7PM-7AM, please contact night-coverage at [email or web address], password [facility] 12/02/2014, 1:12 PM  LOS: 5 days

## 2014-12-02 NOTE — Progress Notes (Signed)
ANTICOAGULATION CONSULT NOTE  Pharmacy Consult for Heparin Indication: VTE treatment  No Known Allergies  Patient Measurements: Height: 6\' 2"  (188 cm) Weight: 202 lb 2.6 oz (91.7 kg) IBW/kg (Calculated) : 82.2 Heparin Dosing Weight: actual weight  Vital Signs: Temp: 98.4 F (36.9 C) (01/16 0634) Temp Source: Oral (01/16 0634) BP: 135/62 mmHg (01/16 0634) Pulse Rate: 89 (01/16 0634)  Labs:  Recent Labs  11/30/14 0620  11/30/14 2246  12/01/14 0615 12/01/14 0900 12/01/14 1831 12/02/14 0430  HGB 7.7*  --  8.7*  --  9.3*  --   --  8.9*  HCT 23.5*  --  26.3*  --  28.6*  --   --  27.1*  PLT 84*  --   --   --  91*  --   --  86*  APTT 42*  --   --   --   --  70*  --   --   LABPROT 15.8*  --   --   --  15.0  --   --   --   INR 1.25  --   --   --  1.17  --   --   --   HEPARINUNFRC 0.40  < >  --   < >  --  0.29* 0.32 0.35  CREATININE 2.60*  --   --   --  2.60*  --   --  2.56*  < > = values in this interval not displayed.  Estimated Creatinine Clearance: 34.8 mL/min (by C-G formula based on Cr of 2.56).  Medications:  Infusions:  . heparin 1,650 Units/hr (12/01/14 1834)    Assessment: 39 YOM admitted 1/11 with metastatic lung cancer and history of LLE DVT with possible upper extremity DVT. He has been on Xarelto at [facility] rehab, initially 15mg  BID started on 12/22 with plan to change to 20mg  once daily dosing on 1/12, but on 1/4, order was restarted as Xarelto 15 mg BID x21d with change to 20mg  daily on 1/26.  Pharmacy is now consulted to dose IV Heparin for VTE.    Significant events: 1/11 Xarelto continued 1/12 Last Xarelto dose given ~ 8am.  Plan to change from Xarelto to Heparin, but start held d/t recent Xarelto and worsening renal function. 1/13 Heparin initiation held for infected PAC removed.  PICC line inserted. 1/14 Pharmacy is re-consulted to dose IV Heparin   Today, 12/02/2014  Heparin level 0.35, therapeutic level on heparin at 1650 units/hr.  Recent  Xarelto use has the potential to falsely elevate heparin level, but with last dose about 96 hours ago, effect has dissipated.  CBC: Hgb decreasing again to 8.9, s/p transfusion 1/14.  Plt decreasing again to 86k.  No active bleeding reported today; noted bloody and purulent dressing changes yesterday. RN to monitor and report any bleeding from [facility] removal site during dressing change (bleeding reported on 1/13)  SCr 2.56, CrCl ~ 34 ml/min   Goal of Therapy:  Heparin level 0.3-0.7 units/ml Monitor platelets by anticoagulation protocol: Yes   Plan:   Continue heparin IV infusion at 1650 units/hr.  Daily heparin level, CBC  Follow up transition back to Xarelto when/if renal function improves  Peggyann Juba, PharmD, BCPS Pager: [phone]  12/02/2014 7:57 AM

## 2014-12-03 ENCOUNTER — Inpatient Hospital Stay (HOSPITAL_COMMUNITY): Payer: Medicaid Other

## 2014-12-03 DIAGNOSIS — T80219A Unspecified infection due to central venous catheter, initial encounter: Secondary | ICD-10-CM | POA: Insufficient documentation

## 2014-12-03 LAB — BLOOD GAS, ARTERIAL
Acid-Base Excess: 2.1 mmol/L — ABNORMAL HIGH (ref 0.0–2.0)
BICARBONATE: 25.4 meq/L — AB (ref 20.0–24.0)
Drawn by: 331471
O2 Content: 4 L/min
O2 Saturation: 96 %
PH ART: 7.455 — AB (ref 7.350–7.450)
Patient temperature: 98.6
TCO2: 22.1 mmol/L (ref 0–100)
pCO2 arterial: 36.7 mmHg (ref 35.0–45.0)
pO2, Arterial: 79.9 mmHg — ABNORMAL LOW (ref 80.0–100.0)

## 2014-12-03 LAB — CBC
HCT: 27.2 % — ABNORMAL LOW (ref 39.0–52.0)
Hemoglobin: 8.8 g/dL — ABNORMAL LOW (ref 13.0–17.0)
MCH: 29.3 pg (ref 26.0–34.0)
MCHC: 32.4 g/dL (ref 30.0–36.0)
MCV: 90.7 fL (ref 78.0–100.0)
Platelets: 105 10*3/uL — ABNORMAL LOW (ref 150–400)
RBC: 3 MIL/uL — ABNORMAL LOW (ref 4.22–5.81)
RDW: 20.7 % — ABNORMAL HIGH (ref 11.5–15.5)
WBC: 12.9 10*3/uL — AB (ref 4.0–10.5)

## 2014-12-03 LAB — COMPREHENSIVE METABOLIC PANEL
ALT: 18 U/L (ref 0–53)
ANION GAP: 4 — AB (ref 5–15)
AST: 44 U/L — AB (ref 0–37)
Albumin: 1.2 g/dL — ABNORMAL LOW (ref 3.5–5.2)
Alkaline Phosphatase: 74 U/L (ref 39–117)
BUN: 63 mg/dL — ABNORMAL HIGH (ref 6–23)
CO2: 27 mmol/L (ref 19–32)
CREATININE: 2.73 mg/dL — AB (ref 0.50–1.35)
Calcium: 7.2 mg/dL — ABNORMAL LOW (ref 8.4–10.5)
Chloride: 104 mEq/L (ref 96–112)
GFR calc Af Amer: 27 mL/min — ABNORMAL LOW (ref >90)
GFR, EST NON AFRICAN AMERICAN: 23 mL/min — AB (ref >90)
GLUCOSE: 88 mg/dL (ref 70–99)
POTASSIUM: 4 mmol/L (ref 3.5–5.1)
Sodium: 135 mmol/L (ref 135–145)
Total Bilirubin: 0.9 mg/dL (ref 0.3–1.2)
Total Protein: 4.7 g/dL — ABNORMAL LOW (ref 6.0–8.3)

## 2014-12-03 LAB — CULTURE, BLOOD (ROUTINE X 2)

## 2014-12-03 LAB — GLUCOSE, CAPILLARY
GLUCOSE-CAPILLARY: 74 mg/dL (ref 70–99)
GLUCOSE-CAPILLARY: 66 mg/dL — AB (ref 70–99)
Glucose-Capillary: 78 mg/dL (ref 70–99)
Glucose-Capillary: 64 mg/dL — ABNORMAL LOW (ref 70–99)
Glucose-Capillary: 82 mg/dL (ref 70–99)
Glucose-Capillary: 88 mg/dL (ref 70–99)

## 2014-12-03 LAB — HEPARIN LEVEL (UNFRACTIONATED)
Heparin Unfractionated: 0.26 IU/mL — ABNORMAL LOW (ref 0.30–0.70)
Heparin Unfractionated: 0.37 IU/mL (ref 0.30–0.70)

## 2014-12-03 MED ORDER — BOOST PLUS PO LIQD
237.0000 mL | Freq: Three times a day (TID) | ORAL | Status: DC
  Administered 2014-12-04: 237 mL via ORAL
  Filled 2014-12-03 (×3): qty 237

## 2014-12-03 MED ORDER — ALBUTEROL SULFATE (2.5 MG/3ML) 0.083% IN NEBU
2.5000 mg | INHALATION_SOLUTION | Freq: Three times a day (TID) | RESPIRATORY_TRACT | Status: DC
  Administered 2014-12-03 – 2014-12-06 (×10): 2.5 mg via RESPIRATORY_TRACT
  Filled 2014-12-03 (×10): qty 3

## 2014-12-03 MED ORDER — FUROSEMIDE 10 MG/ML IJ SOLN
20.0000 mg | Freq: Once | INTRAMUSCULAR | Status: AC
  Administered 2014-12-03: 20 mg via INTRAVENOUS
  Filled 2014-12-03: qty 2

## 2014-12-03 MED ORDER — DEXTROSE-NACL 5-0.45 % IV SOLN
INTRAVENOUS | Status: AC
  Administered 2014-12-03 – 2014-12-06 (×3): via INTRAVENOUS

## 2014-12-03 MED ORDER — DEXAMETHASONE SODIUM PHOSPHATE 4 MG/ML IJ SOLN
4.0000 mg | Freq: Two times a day (BID) | INTRAMUSCULAR | Status: DC
  Administered 2014-12-03 – 2014-12-04 (×2): 4 mg via INTRAVENOUS
  Filled 2014-12-03 (×3): qty 1

## 2014-12-03 MED ORDER — HEPARIN (PORCINE) IN NACL 100-0.45 UNIT/ML-% IJ SOLN
1750.0000 [IU]/h | INTRAMUSCULAR | Status: DC
  Administered 2014-12-03 – 2014-12-07 (×6): 1750 [IU]/h via INTRAVENOUS
  Filled 2014-12-03 (×12): qty 250

## 2014-12-03 NOTE — Progress Notes (Signed)
ANTICOAGULATION CONSULT NOTE  Pharmacy Consult for Heparin Indication: VTE treatment  No Known Allergies  Patient Measurements: Height: 6\' 2"  (188 cm) Weight: 206 lb 9.1 oz (93.7 kg) IBW/kg (Calculated) : 82.2 Heparin Dosing Weight: actual weight  Vital Signs: Temp: 98.2 F (36.8 C) (01/17 0638) Temp Source: Oral (01/17 6720) BP: 100/55 mmHg (01/17 0638) Pulse Rate: 85 (01/17 0638)  Labs:  Recent Labs  12/01/14 0615 12/01/14 0900 12/01/14 1831 12/02/14 0430 12/03/14 0355  HGB 9.3*  --   --  8.9* 8.8*  HCT 28.6*  --   --  27.1* 27.2*  PLT 91*  --   --  86* 105*  APTT  --  70*  --   --   --   LABPROT 15.0  --   --   --   --   INR 1.17  --   --   --   --   HEPARINUNFRC  --  0.29* 0.32 0.35 0.26*  CREATININE 2.60*  --   --  2.56* 2.73*    Estimated Creatinine Clearance: 32.6 mL/min (by C-G formula based on Cr of 2.73).  Medications:  Infusions:  . heparin 1,650 Units/hr (12/03/14 0230)    Assessment: 68 YOM admitted 1/11 with metastatic lung cancer and history of LLE DVT with possible upper extremity DVT. He has been on Xarelto at [facility] rehab, initially 15mg  BID started on 12/22 with plan to change to 20mg  once daily dosing on 1/12, but on 1/4, order was restarted as Xarelto 15 mg BID x21d with change to 20mg  daily on 1/26.  Pharmacy is now consulted to dose IV Heparin for VTE.    Significant events: 1/11 Xarelto continued 1/12 Last Xarelto dose given ~ 8am.  Plan to change from Xarelto to Heparin, but start held d/t recent Xarelto and worsening renal function. 1/13 Heparin initiation held for infected PAC removed.  PICC line inserted. 1/14 Pharmacy is re-consulted to dose IV Heparin   Today, 12/03/2014  Heparin level 0.26, SUBtherapeutic level on heparin at 1650 units/hr. Verified no issues with IV pump and that running at correct rate with RN  Hgb decreasing again to 8.8, s/p transfusion 1/14.  Plt had been decreasing, but now improved to 105k today.   No  active bleeding reported today per RN; noted bloody and purulent dressing changes 1/15. RN to monitor and report any bleeding from [facility] removal site during dressing change (bleeding reported on 1/13)  SCr continues to rise to 2.73, CrCl ~ 32 ml/min  Goal of Therapy:  Heparin level 0.3-0.7 units/ml Monitor platelets by anticoagulation protocol: Yes   Plan:   Increase heparin IV infusion at 1750 units/hr.  Recheck heparin level in 8 hours  Daily heparin level, CBC  Follow up transition back to Xarelto when/if renal function improves  Peggyann Juba, PharmD, BCPS Pager: [phone]  12/03/2014 7:11 AM

## 2014-12-03 NOTE — Progress Notes (Signed)
[facility] for Infectious Disease    Subjective: "I want people to leave me alone as much as possible"   Antibiotics:  Anti-infectives    Start     Dose/Rate Route Frequency Ordered Stop   11/30/14 1600  vancomycin (VANCOCIN) 50 mg/mL oral solution 125 mg     125 mg Oral 4 times per day 11/30/14 1441     11/30/14 1400  metroNIDAZOLE (FLAGYL) IVPB 500 mg  Status:  Discontinued     500 mg100 mL/hr over 60 Minutes Intravenous Every 8 hours 11/30/14 1314 11/30/14 1441   12/16/2014 1200  vancomycin (VANCOCIN) IVPB 1000 mg/200 mL premix  Status:  Discontinued     1,000 mg200 mL/hr over 60 Minutes Intravenous Every 24 hours 11/24/2014 0703 12/02/14 1157   11/28/14 1000  fluconazole (DIFLUCAN) tablet 100 mg  Status:  Discontinued     100 mg Oral Daily 12/15/2014 1815 11/28/14 0633   11/28/2014 2000  ceFEPIme (MAXIPIME) 1 g in dextrose 5 % 50 mL IVPB  Status:  Discontinued     1 g100 mL/hr over 30 Minutes Intravenous Every 12 hours 12/11/2014 1815 12/11/2014 0934      Medications: Scheduled Meds: . albuterol  2.5 mg Nebulization TID  . calcium carbonate  1,250 mg Oral TID  . carvedilol  9.375 mg Oral BID WC  . Chlorhexidine Gluconate Cloth  6 each Topical Q0600  . dexamethasone  4 mg Intravenous Q12H  . escitalopram  10 mg Oral Daily  . furosemide  40 mg Oral Daily  . insulin aspart  0-5 Units Subcutaneous QHS  . insulin aspart  0-9 Units Subcutaneous TID WC  . isosorbide-hydrALAZINE  1 tablet Oral BID  . [START ON 12/04/2014] lactose free nutrition  237 mL Oral TID WC  . mupirocin ointment  1 application Nasal BID  . nicotine  7 mg Transdermal Daily  . pantoprazole  40 mg Oral Daily  . sodium chloride  3 mL Intravenous Q12H  . vancomycin  125 mg Oral 4 times per day   Continuous Infusions: . dextrose 5 % and 0.45% NaCl    . heparin 1,750 Units/hr (12/03/14 1004)   PRN Meds:.sodium chloride, acetaminophen, albuterol, clonazePAM, morphine injection, ondansetron **OR** ondansetron  (ZOFRAN) IV, polyethylene glycol, promethazine, sodium chloride, sodium chloride, traMADol    Objective: Weight change: 4 lb 6.6 oz (2 kg)  Intake/Output Summary (Last 24 hours) at 12/03/14 1823 Last data filed at 12/03/14 1700  Gross per 24 hour  Intake    480 ml  Output   1375 ml  Net   -895 ml   Blood pressure 106/49, pulse 85, temperature 98.2 F (36.8 C), temperature source Oral, resp. rate 19, height 6\' 2"  (1.88 m), weight 206 lb 9.1 oz (93.7 kg), SpO2 92 %. Temp:  [98.2 F (36.8 C)-98.6 F (37 C)] 98.2 F (36.8 C) (01/17 1523) Pulse Rate:  [85-88] 85 (01/17 1523) Resp:  [18-20] 19 (01/17 1523) BP: (100-119)/(49-63) 106/49 mmHg (01/17 1523) SpO2:  [92 %-99 %] 92 % (01/17 1523) Weight:  [206 lb 9.1 oz (93.7 kg)] 206 lb 9.1 oz (93.7 kg) (01/17 2202)  Physical Exam: General: Alert and awake, oriented x3,  HEENT:  EOMI CVS tachy rate, normal r,  no murmur rubs or gallops Chest: clear to auscultation bilaterally, no wheezing, rales or rhonchi Abdomen: slightly distended, nontender Skin: PICC in place Neuro: nonfocal  CBC:  . CBC Latest Ref Rng 12/03/2014 12/02/2014 12/01/2014  WBC 4.0 - 10.5 K/uL 12.9(H)  13.2(H) 15.4(H)  Hemoglobin 13.0 - 17.0 g/dL 8.8(L) 8.9(L) 9.3(L)  Hematocrit 39.0 - 52.0 % 27.2(L) 27.1(L) 28.6(L)  Platelets 150 - 400 K/uL 105(L) 86(L) 91(L)     BMET  Recent Labs  12/02/14 0430 12/03/14 0355  NA 135 135  K 4.1 4.0  CL 99 104  CO2 26 27  GLUCOSE 90 88  BUN 64* 63*  CREATININE 2.56* 2.73*  CALCIUM 7.2* 7.2*     Liver Panel   Recent Labs  12/02/14 0430 12/03/14 0355  PROT 4.6* 4.7*  ALBUMIN 1.2* 1.2*  AST 43* 44*  ALT 17 18  ALKPHOS 75 74  BILITOT 0.8 0.9       Sedimentation Rate No results for input(s): ESRSEDRATE in the last 72 hours. C-Reactive Protein No results for input(s): CRP in the last 72 hours.  Micro Results: Recent Results (from the past 720 hour(s))  Culture, blood (routine x 2)     Status: None    Collection Time: 12/14/2014  7:00 PM  Result Value Ref Range Status   Specimen Description BLOOD LEFT ARM  Final   Special Requests BOTTLES DRAWN AEROBIC AND ANAEROBIC 10CC  Final   Culture   Final    STAPHYLOCOCCUS AUREUS Note: SUSCEPTIBILITIES PERFORMED ON PREVIOUS CULTURE WITHIN THE LAST 5 DAYS. Note: Gram Stain Report Called to,Read Back By and Verified With: [facility] ADAMSON 12/02/14 430AM [place] Performed at [facility]    Report Status 12/03/2014 FINAL  Final  Culture, blood (routine x 2)     Status: None   Collection Time: 11/26/2014  7:04 PM  Result Value Ref Range Status   Specimen Description BLOOD RIGHT HAND  Final   Special Requests BOTTLES DRAWN AEROBIC AND ANAEROBIC  10CC  Final   Culture   Final    METHICILLIN RESISTANT STAPHYLOCOCCUS AUREUS Note: RIFAMPIN AND GENTAMICIN SHOULD NOT BE USED AS SINGLE DRUGS FOR TREATMENT OF STAPH INFECTIONS. CRITICAL RESULT CALLED TO, READ BACK BY AND VERIFIED WITH: Laurance Flatten 12/01/14 1245 BY SMITHERSJ Note: Culture results may be compromised due to an inadequate volume of blood received in culture bottles. Gram Stain Report Called to,Read Back By and Verified With: GRACE F@9 :57AM ON 11/30/14 BY DANTS Performed at [facility]    Report Status 12/02/2014 FINAL  Final   Organism ID, Bacteria METHICILLIN RESISTANT STAPHYLOCOCCUS AUREUS  Final      Susceptibility   Methicillin resistant staphylococcus aureus - MIC*    CLINDAMYCIN >=8 RESISTANT Resistant     ERYTHROMYCIN >=8 RESISTANT Resistant     GENTAMICIN <=0.5 SENSITIVE Sensitive     LEVOFLOXACIN >=8 RESISTANT Resistant     OXACILLIN >=4 RESISTANT Resistant     PENICILLIN >=0.5 RESISTANT Resistant     RIFAMPIN <=0.5 SENSITIVE Sensitive     TRIMETH/SULFA <=10 SENSITIVE Sensitive     VANCOMYCIN 1 SENSITIVE Sensitive     TETRACYCLINE 2 SENSITIVE Sensitive     * METHICILLIN RESISTANT STAPHYLOCOCCUS AUREUS  MRSA PCR Screening     Status: Abnormal   Collection Time: 11/19/2014   5:50 AM  Result Value Ref Range Status   MRSA by PCR POSITIVE (A) NEGATIVE Final    Comment:        The GeneXpert MRSA Assay (FDA approved for NASAL specimens only), is one component of a comprehensive MRSA colonization surveillance program. It is not intended to diagnose MRSA infection nor to guide or monitor treatment for MRSA infections. RESULT CALLED TO, READ BACK BY AND VERIFIED WITH: Norberta Keens RN  AT 0710 ON 01.13.16 BY SHUEA   Clostridium Difficile by PCR     Status: Abnormal   Collection Time: 12/03/2014  5:53 PM  Result Value Ref Range Status   C difficile by pcr POSITIVE (A) NEGATIVE Final    Comment: CRITICAL RESULT CALLED TO, READ BACK BY AND VERIFIED WITH: SISON RN 11:10 11/30/14 ([email or web address]) Performed at [facility]   Culture, blood (routine x 2)     Status: None (Preliminary result)   Collection Time: 11/30/14 10:40 PM  Result Value Ref Range Status   Specimen Description BLOOD RIGHT ARM  Final   Special Requests BOTTLES DRAWN AEROBIC ONLY 7ML  Final   Culture   Final           BLOOD CULTURE RECEIVED NO GROWTH TO DATE CULTURE WILL BE HELD FOR 5 DAYS BEFORE ISSUING A FINAL NEGATIVE REPORT Performed at [facility]    Report Status PENDING  Incomplete  Culture, blood (routine x 2)     Status: None (Preliminary result)   Collection Time: 11/30/14 10:40 PM  Result Value Ref Range Status   Specimen Description BLOOD RIGHT FOREARM  Final   Special Requests BOTTLES DRAWN AEROBIC ONLY 4ML  Final   Culture   Final           BLOOD CULTURE RECEIVED NO GROWTH TO DATE CULTURE WILL BE HELD FOR 5 DAYS BEFORE ISSUING A FINAL NEGATIVE REPORT Performed at [facility]    Report Status PENDING  Incomplete    Studies/Results: Dg Chest Port 1 View  12/03/2014   CLINICAL DATA:  Shortness of breath, hypoxia, hypertension, diabetes, metastatic lung cancer  EXAM: PORTABLE CHEST - 1 VIEW  COMPARISON:  Portable exam 1723 hr compared to 12/01/2014  FINDINGS:  LEFT arm PICC line tip projects over cavoatrial junction.  Upper normal heart size.  Normal mediastinal contours and pulmonary vascularity.  Atelectasis versus consolidation LEFT lower lobe unchanged.  Probable RIGHT basilar pleural effusion with atelectasis versus infiltrate at RIGHT base.  Upper lungs clear.  Atherosclerotic calcification aorta.  No pneumothorax.  IMPRESSION: Little interval change in atelectasis versus consolidation in LEFT lower lobe, probable RIGHT pleural effusion and mild RIGHT basilar atelectasis versus infiltrate.   Electronically Signed   By: Lavonia Dana M.D.   On: 12/03/2014 17:43      Assessment/Plan:  Principal Problem:   MRSA bacteremia Active Problems:   Essential hypertension   Protein-calorie malnutrition, severe   Metastatic lung carcinoma   CRI (chronic renal insufficiency)   Diabetic foot ulcer associated with type 2 diabetes mellitus   DVT, lower extremity, proximal, chronic   DM (diabetes mellitus), type 1 with peripheral vascular complications   Chronic systolic heart failure   COPD (chronic obstructive pulmonary disease)   Cigarette smoker   Normocytic anemia   Thrombocytopenia   Depression   Anxiety   Infection of venous access Port-a-catheter s/p removal 11/29/2013   Enteritis due to Clostridium difficile    Ralph [name] is a 64 y.o. male with  staphylcocccus aureus bacteremia likely due to porta-cath infection with + cultures on the 8th, portacath removed but blood cultures on the 11th still positive for MRSA. PICC line was placed on 11/21/2014 prior to documented clearance of blood cultures (most recent blood cultures sent on 11/30/14, pt in interim sp drainage of pleural effusion and with dx of CDI  I was alerted to his presence by Staph Aureus alert from [place]  #1  [place] Antimicrobial Management Team Staphylococcus aureus bacteremia   Staphylococcus aureus bacteremia (SAB) is associated with a high rate of complications and  mortality.  Specific aspects of clinical management are critical to optimizing the outcome of patients with SAB.  Therefore, the [facility] Antimicrobial Management Team [facility]) has initiated an intervention aimed at improving the management of SAB at [facility].  To do so, Infectious Diseases physicians are providing an evidence-based consult for the management of all patients with SAB.     Yes No Comments  Perform follow-up blood cultures (even if the patient is afebrile) to ensure clearance of bacteremia [x]  []  If we were to follow guideline aggressively to ensure clearance of bacteremia we should repeat blood cultures AFTER removal of PICC line  Remove vascular catheter and obtain follow-up blood cultures after the removal of the catheter [x]  []   I HAVE CONCERNS THAT HIS PICC IS INFECTED IT WAS PLACED SAME DAY AS REMOVAL OF PORTA CATH AND IN SETTING OF MOST RECENT BLOOD CULTURES STILL HAVING BEEN POSITIVE  I THINK IT SHOULD BE REMOVED , CHECK BLOOD CULTURES POST REMOVAL  HE HAS PERIPHERAL ACCESS, GIVE CATHETER HOLIDAY FOR 3 DAYS AND THEN PLACE NEW PICC IF POST PICC REMOVAL BLOOD CUTLURES ARE STERILE  Perform echocardiography to evaluate for endocarditis (transthoracic ECHO is 40-50% sensitive, TEE is > 90% sensitive) []  []  Please keep in mind, that neither test can definitively EXCLUDE endocarditis, and that should clinical suspicion remain high for endocarditis the patient should then still be treated with an "endocarditis" duration of therapy = 6 weeks, 2D done but note TEE  Consult electrophysiologist to evaluate implanted cardiac device (pacemaker, ICD) []  []  NA  Ensure source control []  []  Have all abscesses been drained effectively? Have deep seeded infections (septic joints or osteomyelitis) had appropriate surgical debridement?  Investigate for "metastatic" sites of infection []  []  Does the patient have ANY symptom or physical exam finding that would suggest a deeper infection (back or neck  pain that may be suggestive of vertebral osteomyelitis or epidural abscess, muscle pain that could be a symptom of pyomyositis)?  Keep in mind that for deep seeded infections MRI imaging with contrast is preferred rather than other often insensitive tests such as plain x-rays, especially early in a patient's presentation. Not clear evidence of this  Change antibiotic therapy to vancomycin []  []  Beta-lactam antibiotics are preferred for MSSA due to higher cure rates.   If on Vancomycin, goal trough should be 15 - 20 mcg/mL  Estimated duration of IV antibiotic therapy:   If we follow IDSA guidelines this is "COMPLICATED BACTEREMIA" since blood has failed to clear rapidly and source (line) not removed rapidly  I would therefore treat him for FOUR WEEKS POST REMOVAL OF PICC LINE HE HAS NOW AND HE IS FINE WITH THIS BUT DOES NOT WANT IT REMOVED TODAY []  []  Consult case management for probably prolonged outpatient IV antibiotic therapy    #2 CDI: [place] for 2 weeks  Dr Megan Salon is back tomorrow.  LOS: 6 days   Alcide Evener 12/03/2014, 6:23 PM

## 2014-12-03 NOTE — Progress Notes (Signed)
[facility] PROGRESS NOTE  Ralph [name] KVQ:[id number] DOB: Feb 04, 1951 DOA: 12/02/2014 PCP: No PCP Per Patient  Summary/Subjective: Appreciate Gen surgery/ID/Oncology/IR. Ralph [name] is a pleasant 64 y.o. male with a complex medical history including diabetes mellitus type 2, anxiety and depression, systolic heart failure with an EF of 25% back in October 2015,metastatic lung cancer on chemotherapy(now discontinued), who came in for generalized weakness and was found to have MRSA bacteremia(BC on 11/24/13). There was concern for port-cath/picc line infection, hence removal of both done. Blood cultures sent on 11/27/2013 growing gram-positive cocci. Patient also had stool for C. difficile PCR send on 11/29/13 and it is positive. His hemoglobin dropped to 7.7 g/dL prompting PRBC transfusion per Oncology(improved to 9.3 g/dL). He is on anticoagulation with IV heparin. Patient is on IV(to complete 3 weeks) and oral vancomycin per ID. He had 2-D echocardiogram which showed EF 45%, trace pericardial effusion and large pleural effusion and had ultrasound-guided drainage(1.3L of yellow colored fluid drained and labs pending). 2-D echo showed No obvious vegetations. Patient seems somewhat confused today. He complains of shortness of breath and has some wheezing on lung exam. Will give an extra dose of Lasix IV and reassess. Per my discussions with patient's son Ralph [name], patient should be eventually DC back to skilled nursing facility. Will continue heparin drip and antibiotics. Follow ID/oncology recommendations. Plan MRSA bacteremia/Right great toe ulcer/ Diabetic foot ulcer associated with type 2 diabetes mellitus/C. difficile colitis/large pleural effusion  On Vancomycin per pharmacy  Defer antibiotic management to ID.   Port-cath/Picc line removed and another PICC line inserted.  Reviewed pleural fluid analysis.  Repeat blood cultures on 11/27/2013 positive for MRSA. Acute systolic congestive  heart failure/?Cardiorenal syndrome: - Continue Lasix oral. Give extra dose of Lasix IV. - Poor UOP. Restrict fluid, low sodium diet. Strict I and O's. - monitor electrolytes. DVT, lower extremity, proximal, chronic/Swollen right arm. - Iv heparin per pharmacy. Appreciate help.  - GFR < 30 hence Xarelto not ideal, use heparin DM (diabetes mellitus), type 1 with peripheral vascular complications - Sugars stable. - Cont SSI. Acute renal failure syndrome - No acute changes. Monitor. Metastatic lung carcinoma/anemia/thrombocytopenia - Defer to Oncology - Monitor hemoglobin Protein-calorie malnutrition, severe - Ensure TID Code Status: Full code Family Communication: Spoke to patient's son Ralph [name] over the [phone]). Disposition Plan: Patient says he wants to go home but his son Ralph [name] says that he should go back to their facility where he has been since October last year. Consultants:  ID/Surgery/Oncology/I is a hospital and light during the normal R  Procedures: Right foot MRI-Severe diffuse cellulitis and myofasciitis but no definite soft tissue abscess, septic arthritis or osteomyelitis  Antibiotics: Vancomycin 11/26/13>  Oral Vancomycin 11/30/13> Objective: Filed Vitals:   12/03/14 0638  BP: 100/55  Pulse: 85  Temp: 98.2 F (36.8 C)  Resp: 18    Intake/Output Summary (Last 24 hours) at 12/03/14 1216 Last data filed at 12/03/14 0644  Gross per 24 hour  Intake    130 ml  Output   1975 ml  Net  -1845 ml   Filed Weights   12/01/14 0628 12/02/14 0500 12/03/14 6433  Weight: 95.6 kg (210 lb 12.2 oz) 91.7 kg (202 lb 2.6 oz) 93.7 kg (206 lb 9.1 oz)    Exam:   General:  Frail  Cardiovascular: S1S2 normal. No murmurs.  Respiratory: Scant wheezing bilaterally.  Abdomen: soft. +BS.  Musculoskeletal: Some edema right foot.   Data Reviewed: Basic Metabolic Panel:  Recent  Labs Lab 12/14/2014 0355 11/30/14 0620 12/01/14 0615 12/02/14 0430  12/03/14 0355  NA 139 139 135 135 135  K 4.8 4.8 4.4 4.1 4.0  CL 104 102 103 99 104  CO2 26 29 27 26 27   GLUCOSE 121* 71 73 90 88  BUN 67* 67* 64* 64* 63*  CREATININE 2.72* 2.60* 2.60* 2.56* 2.73*  CALCIUM 7.5* 7.5* 7.0* 7.2* 7.2*   Liver Function Tests:  Recent Labs Lab 11/28/14 0305 11/30/14 0620 12/01/14 0615 12/02/14 0430 12/03/14 0355  AST 28 38* 42* 43* 44*  ALT 15 16 18 17 18   ALKPHOS 66 63 69 75 74  BILITOT 0.4 0.6 0.6 0.8 0.9  PROT 5.2* 4.7* 4.8* 4.6* 4.7*  ALBUMIN 1.5* 1.3* 1.2* 1.2* 1.2*   No results for input(s): LIPASE, AMYLASE in the last 168 hours. No results for input(s): AMMONIA in the last 168 hours. CBC:  Recent Labs Lab 12/08/2014 0917 12/15/2014 1904  12/04/2014 0355 11/30/14 0620 11/30/14 2246 12/01/14 0615 12/02/14 0430 12/03/14 0355  WBC 16.2* 20.4*  < > 15.9* 15.4*  --  15.4* 13.2* 12.9*  NEUTROABS 14.8* 19.6*  --   --   --   --   --   --   --   HGB 9.1* 9.5*  < > 8.4* 7.7* 8.7* 9.3* 8.9* 8.8*  HCT 28.3* 29.7*  < > 26.6* 23.5* 26.3* 28.6* 27.1* 27.2*  MCV 90 89.5  < > 90.5 89.4  --  89.4 89.7 90.7  PLT 130* 123*  < > 105* 84*  --  91* 86* 105*  < > = values in this interval not displayed. Cardiac Enzymes:  Recent Labs Lab 11/30/2014 1904 11/28/14 11/28/14 0610  TROPONINI 0.07* 0.06* 0.06*   BNP (last 3 results)  Recent Labs  09/12/14 0620 11/23/14 1619  PROBNP 8587.0* 409.0*   CBG:  Recent Labs Lab 12/02/14 0753 12/02/14 1224 12/02/14 1719 12/02/14 2228 12/03/14 0758  GLUCAP 79 72 83 110* 74    Recent Results (from the past 240 hour(s))  Culture, blood (routine x 2)     Status: None   Collection Time: 12/02/2014  7:00 PM  Result Value Ref Range Status   Specimen Description BLOOD LEFT ARM  Final   Special Requests BOTTLES DRAWN AEROBIC AND ANAEROBIC 10CC  Final   Culture   Final    STAPHYLOCOCCUS AUREUS Note: SUSCEPTIBILITIES PERFORMED ON PREVIOUS CULTURE WITHIN THE LAST 5 DAYS. Note: Gram Stain Report Called to,Read  Back By and Verified With: [facility] ADAMSON 12/02/14 430AM [place] Performed at [facility]    Report Status 12/03/2014 FINAL  Final  Culture, blood (routine x 2)     Status: None   Collection Time: 11/17/2014  7:04 PM  Result Value Ref Range Status   Specimen Description BLOOD RIGHT HAND  Final   Special Requests BOTTLES DRAWN AEROBIC AND ANAEROBIC  10CC  Final   Culture   Final    METHICILLIN RESISTANT STAPHYLOCOCCUS AUREUS Note: RIFAMPIN AND GENTAMICIN SHOULD NOT BE USED AS SINGLE DRUGS FOR TREATMENT OF STAPH INFECTIONS. CRITICAL RESULT CALLED TO, READ BACK BY AND VERIFIED WITH: Laurance Flatten 12/01/14 1245 BY SMITHERSJ Note: Culture results may be compromised due to an inadequate volume of blood received in culture bottles. Gram Stain Report Called to,Read Back By and Verified With: GRACE F@9 :57AM ON 11/30/14 BY DANTS Performed at [facility]    Report Status 12/02/2014 FINAL  Final   Organism ID, Bacteria METHICILLIN RESISTANT STAPHYLOCOCCUS AUREUS  Final  Susceptibility   Methicillin resistant staphylococcus aureus - MIC*    CLINDAMYCIN >=8 RESISTANT Resistant     ERYTHROMYCIN >=8 RESISTANT Resistant     GENTAMICIN <=0.5 SENSITIVE Sensitive     LEVOFLOXACIN >=8 RESISTANT Resistant     OXACILLIN >=4 RESISTANT Resistant     PENICILLIN >=0.5 RESISTANT Resistant     RIFAMPIN <=0.5 SENSITIVE Sensitive     TRIMETH/SULFA <=10 SENSITIVE Sensitive     VANCOMYCIN 1 SENSITIVE Sensitive     TETRACYCLINE 2 SENSITIVE Sensitive     * METHICILLIN RESISTANT STAPHYLOCOCCUS AUREUS  MRSA PCR Screening     Status: Abnormal   Collection Time: 11/28/2014  5:50 AM  Result Value Ref Range Status   MRSA by PCR POSITIVE (A) NEGATIVE Final    Comment:        The GeneXpert MRSA Assay (FDA approved for NASAL specimens only), is one component of a comprehensive MRSA colonization surveillance program. It is not intended to diagnose MRSA infection nor to guide or monitor treatment for MRSA  infections. RESULT CALLED TO, READ BACK BY AND VERIFIED WITH: H. HOLDERNESS RN AT 0710 ON 01.13.16 BY SHUEA   Clostridium Difficile by PCR     Status: Abnormal   Collection Time: 12/08/2014  5:53 PM  Result Value Ref Range Status   C difficile by pcr POSITIVE (A) NEGATIVE Final    Comment: CRITICAL RESULT CALLED TO, READ BACK BY AND VERIFIED WITH: SISON RN 11:10 11/30/14 ([email or web address]) Performed at [facility]   Culture, blood (routine x 2)     Status: None (Preliminary result)   Collection Time: 11/30/14 10:40 PM  Result Value Ref Range Status   Specimen Description BLOOD RIGHT ARM  Final   Special Requests BOTTLES DRAWN AEROBIC ONLY 7ML  Final   Culture   Final           BLOOD CULTURE RECEIVED NO GROWTH TO DATE CULTURE WILL BE HELD FOR 5 DAYS BEFORE ISSUING A FINAL NEGATIVE REPORT Performed at [facility]    Report Status PENDING  Incomplete  Culture, blood (routine x 2)     Status: None (Preliminary result)   Collection Time: 11/30/14 10:40 PM  Result Value Ref Range Status   Specimen Description BLOOD RIGHT FOREARM  Final   Special Requests BOTTLES DRAWN AEROBIC ONLY 4ML  Final   Culture   Final           BLOOD CULTURE RECEIVED NO GROWTH TO DATE CULTURE WILL BE HELD FOR 5 DAYS BEFORE ISSUING A FINAL NEGATIVE REPORT Performed at [facility]    Report Status PENDING  Incomplete     Studies: No results found.  Scheduled Meds: . albuterol  2.5 mg Nebulization QID  . calcium carbonate  1,250 mg Oral TID  . carvedilol  9.375 mg Oral BID WC  . Chlorhexidine Gluconate Cloth  6 each Topical Q0600  . escitalopram  10 mg Oral Daily  . furosemide  20 mg Intravenous Once  . furosemide  40 mg Oral Daily  . insulin aspart  0-5 Units Subcutaneous QHS  . insulin aspart  0-9 Units Subcutaneous TID WC  . isosorbide-hydrALAZINE  1 tablet Oral BID  . mupirocin ointment  1 application Nasal BID  . nicotine  7 mg Transdermal Daily  . pantoprazole  40 mg Oral Daily  .  sodium chloride  3 mL Intravenous Q12H  . vancomycin  125 mg Oral 4 times per day   Continuous Infusions: . heparin 1,750  Units/hr (12/03/14 1004)    Principal Problem:   MRSA bacteremia Active Problems:   Essential hypertension   Protein-calorie malnutrition, severe   Metastatic lung carcinoma   CRI (chronic renal insufficiency)   Diabetic foot ulcer associated with type 2 diabetes mellitus   DVT, lower extremity, proximal, chronic   DM (diabetes mellitus), type 1 with peripheral vascular complications   Chronic systolic heart failure   COPD (chronic obstructive pulmonary disease)   Cigarette smoker   Normocytic anemia   Thrombocytopenia   Depression   Anxiety   Infection of venous access Port-a-catheter s/p removal 11/29/2013   Enteritis due to Clostridium difficile    Time spent: 25 minutes.    Ralph [name]  [facility] Pager [phone]. If 7PM-7AM, please contact night-coverage at [email or web address], password [facility] 12/03/2014, 12:16 PM  LOS: 6 days

## 2014-12-03 NOTE — Progress Notes (Signed)
BRIEF ANTICOAGULATION CONSULT NOTE  Pharmacy Consult for Heparin Indication: VTE treatment  Heparin level = 0.37 on 1750 units/hr  No bleeding reported per RN  Plan:  Continue heparin 1750 units/hr  F/u heparin level in AM  Peggyann Juba, PharmD, BCPS Pager: [phone] 12/03/2014 4:53 PM

## 2014-12-03 NOTE — Progress Notes (Addendum)
Patient more dyspneic. Will obtain ABG/CXR. Add dexamethasone for now. Developed hypoglycemia of 66 mg/dL. Food intake is poor. Will add boost plus. Also add D5W1/2ns at 30cc/hr.

## 2014-12-04 ENCOUNTER — Other Ambulatory Visit: Payer: Self-pay | Admitting: Nurse Practitioner

## 2014-12-04 LAB — VANCOMYCIN, RANDOM: Vancomycin Rm: 21 ug/mL

## 2014-12-04 LAB — COMPREHENSIVE METABOLIC PANEL
ALT: 19 U/L (ref 0–53)
ANION GAP: 10 (ref 5–15)
AST: 53 U/L — AB (ref 0–37)
Albumin: 1.1 g/dL — ABNORMAL LOW (ref 3.5–5.2)
Alkaline Phosphatase: 75 U/L (ref 39–117)
BUN: 63 mg/dL — ABNORMAL HIGH (ref 6–23)
CALCIUM: 7.3 mg/dL — AB (ref 8.4–10.5)
CO2: 25 mmol/L (ref 19–32)
CREATININE: 2.85 mg/dL — AB (ref 0.50–1.35)
Chloride: 104 mEq/L (ref 96–112)
GFR, EST AFRICAN AMERICAN: 26 mL/min — AB (ref >90)
GFR, EST NON AFRICAN AMERICAN: 22 mL/min — AB (ref >90)
Glucose, Bld: 121 mg/dL — ABNORMAL HIGH (ref 70–99)
POTASSIUM: 4.5 mmol/L (ref 3.5–5.1)
Sodium: 139 mmol/L (ref 135–145)
TOTAL PROTEIN: 4.6 g/dL — AB (ref 6.0–8.3)
Total Bilirubin: 0.8 mg/dL (ref 0.3–1.2)

## 2014-12-04 LAB — CBC
HCT: 27 % — ABNORMAL LOW (ref 39.0–52.0)
Hemoglobin: 8.7 g/dL — ABNORMAL LOW (ref 13.0–17.0)
MCH: 29 pg (ref 26.0–34.0)
MCHC: 32.2 g/dL (ref 30.0–36.0)
MCV: 90 fL (ref 78.0–100.0)
PLATELETS: 112 10*3/uL — AB (ref 150–400)
RBC: 3 MIL/uL — AB (ref 4.22–5.81)
RDW: 20.7 % — ABNORMAL HIGH (ref 11.5–15.5)
WBC: 15.1 10*3/uL — ABNORMAL HIGH (ref 4.0–10.5)

## 2014-12-04 LAB — HEPARIN LEVEL (UNFRACTIONATED): HEPARIN UNFRACTIONATED: 0.39 [IU]/mL (ref 0.30–0.70)

## 2014-12-04 LAB — GLUCOSE, CAPILLARY
GLUCOSE-CAPILLARY: 131 mg/dL — AB (ref 70–99)
Glucose-Capillary: 132 mg/dL — ABNORMAL HIGH (ref 70–99)
Glucose-Capillary: 138 mg/dL — ABNORMAL HIGH (ref 70–99)
Glucose-Capillary: 204 mg/dL — ABNORMAL HIGH (ref 70–99)

## 2014-12-04 MED ORDER — DEXAMETHASONE SODIUM PHOSPHATE 4 MG/ML IJ SOLN
4.0000 mg | INTRAMUSCULAR | Status: DC
  Administered 2014-12-05: 4 mg via INTRAVENOUS
  Filled 2014-12-04: qty 1

## 2014-12-04 MED ORDER — ENSURE COMPLETE PO LIQD
237.0000 mL | Freq: Two times a day (BID) | ORAL | Status: DC
  Administered 2014-12-04 – 2014-12-06 (×5): 237 mL via ORAL

## 2014-12-04 NOTE — Progress Notes (Signed)
Went to change the dressings on patient he said to come back that I could change the dressings in the morning but not too early. I will check back with the patient in the am.

## 2014-12-04 NOTE — Discharge Instructions (Signed)
Dressing Change A dressing is a material placed over wounds. It keeps the wound clean, dry, and protected from further injury.  BEFORE YOU BEGIN  Get your supplies together. Things you may need include:  Salt solution (saline).  Flexible gauze bandage.  Medicated cream.  Tape.  Gloves.  Belly (abdominal) pads.  Gauze squares.  Plastic bags.  Take pain medicine 30 minutes before the bandage change if you need it.  Take a shower before you do the first bandage change of the day. Put plastic wrap or a bag over the dressing. REMOVING YOUR OLD BANDAGE  Wash your hands with soap and water. Dry your hands with a clean towel.  Put on your gloves.  Remove any tape.  Remove the old bandage as told. If it sticks, put a small amount of warm water on it to loosen the bandage.  Remove any gauze or packing tape in your wound.  Take off your gloves.  Put the gloves, tape, gauze, or any packing tape in a plastic bag. CHANGING YOUR BANDAGE  Open the supplies.  Take the cap off the salt solution.  Open the gauze. Leave the gauze on the inside of the package.  Put on your gloves.  Clean your wound as told by your doctor.  Keep your wound dry if your doctor told you to do so.  Your doctor may tell you to do one or more of the following:  Pick up the gauze. Pour the salt solution over the gauze. Squeeze out the extra salt solution.  Put medicated cream or other medicine on your wound.  Put solution soaked gauze only in your wound, not on the skin around it.  Pack your wound loosely.  Put dry gauze on your wound.  Put belly pads over the dry gauze if your bandages soak through.  Tape the bandage in place so it will not fall off. Do not wrap the tape all the way around your arm or leg.  Wrap the bandage with the flexible gauze bandage as told by your doctor.  Take off your gloves. Put them in the plastic bag with the old bandage. Tie the bag shut and throw it  away.  Keep the bandage clean and dry.  Wash your hands. GET HELP RIGHT AWAY IF:   Your skin around the wound looks red.  Your wound feels more tender or sore.  You see yellowish-white fluid (pus) in the wound.  Your wound smells bad.  You have a fever.  Your skin around the wound has a red rash that itches and burns.  You see black or yellow skin in your wound that was not there before.  You feel sick to your stomach (nauseous), throw up (vomit), and feel very tired. Document Released: 01/30/2009 Document Revised: 03/20/2014 Document Reviewed: 09/14/2011 [facility] Patient Information 2015 [place], [place]. This information is not intended to replace advice given to you by your health care provider. Make sure you discuss any questions you have with your health care provider.

## 2014-12-04 NOTE — Progress Notes (Signed)
ANTIBIOTIC CONSULT NOTE - FOLLOW UP  Pharmacy Consult for Vancomycin Indication: MRSA bacteremia, diabetic foot infection  No Known Allergies  Patient Measurements: Height: 6\' 2"  (188 cm) Weight: 204 lb 2.3 oz (92.6 kg) IBW/kg (Calculated) : 82.2  Vital Signs: Temp: 97.4 F (36.3 C) (01/18 0523) Temp Source: Oral (01/18 0523) BP: 121/52 mmHg (01/18 0523) Pulse Rate: 89 (01/18 0523) Intake/Output from previous day: 01/17 0701 - 01/18 0700 In: 480 [P.O.:480] Out: 925 [Urine:425; Stool:500] Intake/Output from this shift:    Labs:  Recent Labs  12/02/14 0430 12/03/14 0355 12/04/14 0435  WBC 13.2* 12.9* 15.1*  HGB 8.9* 8.8* 8.7*  PLT 86* 105* 112*  CREATININE 2.56* 2.73* 2.85*   Estimated Creatinine Clearance: 31.2 mL/min (by C-G formula based on Cr of 2.85).  Recent Labs  12/02/14 1100 12/04/14 0435  VANCOTROUGH 30.3*  --   VANCORANDOM  --  21.0     Microbiology: Recent Results (from the past 720 hour(s))  Culture, blood (routine x 2)     Status: None   Collection Time: 12/09/2014  7:00 PM  Result Value Ref Range Status   Specimen Description BLOOD LEFT ARM  Final   Special Requests BOTTLES DRAWN AEROBIC AND ANAEROBIC 10CC  Final   Culture   Final    STAPHYLOCOCCUS AUREUS Note: SUSCEPTIBILITIES PERFORMED ON PREVIOUS CULTURE WITHIN THE LAST 5 DAYS. Note: Gram Stain Report Called to,Read Back By and Verified With: [facility] ADAMSON 12/02/14 430AM [place] Performed at [facility]    Report Status 12/03/2014 FINAL  Final  Culture, blood (routine x 2)     Status: None   Collection Time: 11/28/2014  7:04 PM  Result Value Ref Range Status   Specimen Description BLOOD RIGHT HAND  Final   Special Requests BOTTLES DRAWN AEROBIC AND ANAEROBIC  10CC  Final   Culture   Final    METHICILLIN RESISTANT STAPHYLOCOCCUS AUREUS Note: RIFAMPIN AND GENTAMICIN SHOULD NOT BE USED AS SINGLE DRUGS FOR TREATMENT OF STAPH INFECTIONS. CRITICAL RESULT CALLED TO, READ BACK BY AND  VERIFIED WITH: Laurance Flatten 12/01/14 1245 BY SMITHERSJ Note: Culture results may be compromised due to an inadequate volume of blood received in culture bottles. Gram Stain Report Called to,Read Back By and Verified With: GRACE F@9 :57AM ON 11/30/14 BY DANTS Performed at [facility]    Report Status 12/02/2014 FINAL  Final   Organism ID, Bacteria METHICILLIN RESISTANT STAPHYLOCOCCUS AUREUS  Final      Susceptibility   Methicillin resistant staphylococcus aureus - MIC*    CLINDAMYCIN >=8 RESISTANT Resistant     ERYTHROMYCIN >=8 RESISTANT Resistant     GENTAMICIN <=0.5 SENSITIVE Sensitive     LEVOFLOXACIN >=8 RESISTANT Resistant     OXACILLIN >=4 RESISTANT Resistant     PENICILLIN >=0.5 RESISTANT Resistant     RIFAMPIN <=0.5 SENSITIVE Sensitive     TRIMETH/SULFA <=10 SENSITIVE Sensitive     VANCOMYCIN 1 SENSITIVE Sensitive     TETRACYCLINE 2 SENSITIVE Sensitive     * METHICILLIN RESISTANT STAPHYLOCOCCUS AUREUS  MRSA PCR Screening     Status: Abnormal   Collection Time: 11/21/2014  5:50 AM  Result Value Ref Range Status   MRSA by PCR POSITIVE (A) NEGATIVE Final    Comment:        The GeneXpert MRSA Assay (FDA approved for NASAL specimens only), is one component of a comprehensive MRSA colonization surveillance program. It is not intended to diagnose MRSA infection nor to guide or monitor treatment for MRSA infections.  RESULT CALLED TO, READ BACK BY AND VERIFIED WITH: H. HOLDERNESS RN AT 0710 ON 01.13.16 BY SHUEA   Clostridium Difficile by PCR     Status: Abnormal   Collection Time: 11/26/2014  5:53 PM  Result Value Ref Range Status   C difficile by pcr POSITIVE (A) NEGATIVE Final    Comment: CRITICAL RESULT CALLED TO, READ BACK BY AND VERIFIED WITH: SISON RN 11:10 11/30/14 ([email or web address]) Performed at [facility]   Culture, blood (routine x 2)     Status: None (Preliminary result)   Collection Time: 11/30/14 10:40 PM  Result Value Ref Range Status   Specimen  Description BLOOD RIGHT ARM  Final   Special Requests BOTTLES DRAWN AEROBIC ONLY 7ML  Final   Culture   Final           BLOOD CULTURE RECEIVED NO GROWTH TO DATE CULTURE WILL BE HELD FOR 5 DAYS BEFORE ISSUING A FINAL NEGATIVE REPORT Performed at [facility]    Report Status PENDING  Incomplete  Culture, blood (routine x 2)     Status: None (Preliminary result)   Collection Time: 11/30/14 10:40 PM  Result Value Ref Range Status   Specimen Description BLOOD RIGHT FOREARM  Final   Special Requests BOTTLES DRAWN AEROBIC ONLY 4ML  Final   Culture   Final           BLOOD CULTURE RECEIVED NO GROWTH TO DATE CULTURE WILL BE HELD FOR 5 DAYS BEFORE ISSUING A FINAL NEGATIVE REPORT Performed at [facility]    Report Status PENDING  Incomplete    Assessment: 86 yoM admitted 12/09/2014 with progressive failure to thrive, LE edema, SOB. PMH includes end stage CHF (EF 25%), metastatic lung cancer, DM, anxiety, depression, DVT (recently on Xarelto), and worsening renal fxn. Empiric broad spectrum antibiotics are started with concern for bacteremia, diabetic foot infection, and/or PAC/PICC line infections. Pharmacy is consulted to continue dosing vancomycin. Port-cath/Picc line removed and another PICC line inserted 11/20/2014.  [facility] MAG Records of PTA antibiotics: Fluconazole 100mg  daily (per MAG, no stop date, prophylactic) Completed 10 day course of Levaquin 750mg  daily x10 days on 1/8 Vancomycin 1500mg  IV q24h started 1/9 with last dose 1/11 at 0400  (PTA 1/9 >>) 1/11 >> Vanc >> 1/11 >> Cefepime >> 1/13 (PTA) 1/11 >> Fluconazole >> 1/11 no doses inpt 1/14 >> metronidazole >> 1/14 1/14 >> PO Vancomycin >>  Tmax: AF WBCs: 12.9, elevated but improving Renal: was improving, now rising again 2.85, normalized CrCl 28ml/min  Outside hospital records 1/8 blood x 2: MRSA 2/2 (vanco MIC = 1) 1/8 urine: MRSA 1/8 Port: MRSA  Current admission 1/11 blood x2:  MRSA 1/13 MRSA PCR: positive 1/13 Cdiff: Positive 1/14 blood x2: ngtd  Drug level / dose changes info: 1/12 0300 Vanc level = 29.6 mcg/mg before 4th dose on 1500mg  q24h per outpt records. Last dose 1500mg  1/11 at 0400 1/13 0500 random vanco = 21.4 - restart 1g q24h 1/16 1100 VT = 30.3 on vanc 1g q24 (prior to 4th dose on this regimen) - HOLD 1/18 0435 random vanco = 21 (a little < 72hr since previous dose), est half-life = 78h (w/ worsening renal fx)  Goal of Therapy:  Vancomycin trough level 15-20 mcg/ml  Plan:  Day #5 Vancomycin (from first blood culture with no growth), total vancomcyin Day #10  HOLD vancomycin for supratherapeutic level  Recheck random level in ~48 hours  Resume dosing when level is < 15  mcg/ml  Doreene Eland, PharmD, BCPS.   Pager: [phone] 12/04/2014,9:34 AM

## 2014-12-04 NOTE — Progress Notes (Signed)
INITIAL NUTRITION ASSESSMENT  DOCUMENTATION CODES Per approved criteria  -Severe malnutrition in the context of chronic illness  Pt meets criteria for severe MALNUTRITION in the context of chronic illness as evidenced by severe muscle wasting and severe fluid accumulation.   INTERVENTION: -Recommend Ensure Complete po TID, each supplement provides 350 kcal and 13 grams of protein -Recommend to liberalize diet to Low Sodium to encourage intake/provide additional meal options -Encouraged adequate intake for chronic illness/wound healing -RD to continue to monitor   NUTRITION DIAGNOSIS: Inadequate oral intake related to decreased appetite as evidenced by PO intake < 75% for 7 days .   Goal: Pt to meet >/= 90% of their estimated nutrition needs    Monitor:  Diet order, total protein/energy intake, labs, weights, skin integrity  Reason for Assessment: Verbal Consult from CM  64 y.o. male  Admitting Dx: MRSA bacteremia  ASSESSMENT: Ralph [name] is a 64 y.o. male past medical history of diabetes mellitus type 2, anxiety and depression, systolic heart failure with an EF of 25% back in October 2015 was seen his cardiologist this month and increase his Lasix and his creatinine worsen, metastatic lung cancer on chemotherapy that comes in for generalized weakness  -Pt reported one week of decreased appetite. Denied nausea, abd pain or taste changes. Has been in and out of hospitals, which likely has impacted appetite. Diet recall indicates pt consuming one meal daily. Supplements occasionally with Ensure 1-2x daily at home. -Current PO intake 15%. Had Boost ordered, but has been refusing as he does not enjoy chocolate taste. RN confirmed modifying supplement order to Ensure -Pt with ongoing loose stools (>3/day) d/t C.diff, likely contributing to decreased appetite -May benefit from liberalizing diet to Low Sodium vs Heart healthy to encourage appetite -Pt unsure of weight loss or weight  status. Current weight likely effected by +4 bilateral edema  Nutrition Focused Physical Exam:  Subcutaneous Fat:  Orbital Region: WDL Upper Arm Region: moderate wasting Thoracic and Lumbar Region: moderate wasting  Muscle:  Temple Region: moderate wasting Clavicle Bone Region: severe wasting Clavicle and Acromion Bone Region: severe wasting Scapular Bone Region: moderate wasting Dorsal Hand: severe wasting Patellar Region: edema Anterior Thigh Region: WDL; edema Posterior Calf Region: WDL; edema  Edema: +4 bilateral lower extremities    Height: Ht Readings from Last 1 Encounters:  12/03/2014 6\' 2"  (1.88 m)    Weight: Wt Readings from Last 1 Encounters:  12/04/14 204 lb 2.3 oz (92.6 kg)    Ideal Body Weight: 190 lb  % Ideal Body Weight: 107%  Wt Readings from Last 10 Encounters:  12/04/14 204 lb 2.3 oz (92.6 kg)  11/23/14 209 lb (94.802 kg)  10/30/14 190 lb (86.183 kg)  10/26/14 185 lb (83.915 kg)  10/09/14 178 lb (80.74 kg)  10/04/14 180 lb (81.647 kg)  09/21/14 182 lb 5.1 oz (82.7 kg)  03/20/13 175 lb (79.379 kg)  12/18/12 177 lb 9.6 oz (80.559 kg)    Usual Body Weight: unable to determine  % Usual Body Weight: unable to determine   BMI:  Body mass index is 26.2 kg/(m^2).  Estimated Nutritional Needs: Kcal: 0814-4818 Protein: 100-110 gram Fluid: 1200 ml per MD  Skin: Stage 2 pressure ulcer on buttock  Diet Order: Diet - low sodium heart healthy Diet Heart  EDUCATION NEEDS: -No education needs identified at this time   Intake/Output Summary (Last 24 hours) at 12/04/14 1425 Last data filed at 12/04/14 1405  Gross per 24 hour  Intake  720 ml  Output   1125 ml  Net   -405 ml    Last BM: 1/17   Labs:   Recent Labs Lab 12/02/14 0430 12/03/14 0355 12/04/14 0435  NA 135 135 139  K 4.1 4.0 4.5  CL 99 104 104  CO2 26 27 25   BUN 64* 63* 63*  CREATININE 2.56* 2.73* 2.85*  CALCIUM 7.2* 7.2* 7.3*  GLUCOSE 90 88 121*    CBG (last 3)    Recent Labs  12/04/14 0739 12/04/14 0905 12/04/14 1148  GLUCAP 132* 138* 131*    Scheduled Meds: . albuterol  2.5 mg Nebulization TID  . calcium carbonate  1,250 mg Oral TID  . carvedilol  9.375 mg Oral BID WC  . [START ON 12/05/2014] dexamethasone  4 mg Intravenous Q24H  . escitalopram  10 mg Oral Daily  . feeding supplement (ENSURE COMPLETE)  237 mL Oral BID BM  . furosemide  40 mg Oral Daily  . insulin aspart  0-5 Units Subcutaneous QHS  . insulin aspart  0-9 Units Subcutaneous TID WC  . isosorbide-hydrALAZINE  1 tablet Oral BID  . nicotine  7 mg Transdermal Daily  . pantoprazole  40 mg Oral Daily  . sodium chloride  3 mL Intravenous Q12H  . vancomycin  125 mg Oral 4 times per day    Continuous Infusions: . dextrose 5 % and 0.45% NaCl 30 mL/hr at 12/03/14 2336  . heparin 1,750 Units/hr (12/03/14 1004)    Past Medical History  Diagnosis Date  . Allergy   . Anxiety     followed [facility] every six months.  . Hypertension   . Diabetes mellitus without complication   . Metastatic lung carcinoma 09/16/2014    Past Surgical History  Procedure Laterality Date  . Rotator cuff surgery      Right  . Lymph node biopsy Right 09/12/2014    Procedure: RIGHT NECK LYMPH NODE BIOPSY;  Surgeon: Jackolyn Confer, MD;  Location: [facility];  Service: General;  Laterality: Right;  . Port-a-cath removal N/A 12/05/2014    Procedure: REMOVAL PORT-A-CATH;  Surgeon: Michael Boston, MD;  Location: [facility];  Service: General;  Laterality: N/A;    Atlee Abide MS RD LDN Clinical Dietitian [phone]:[phone]

## 2014-12-04 NOTE — Progress Notes (Signed)
Patient ID: Ralph [name], male   DOB: [birth year], 64 y.o.   MRN: [id number]         [place] [facility]    Date of Admission:  12/17/2014           Day 10 IV vancomycin        Day 5 oral vancomycin  Principal Problem:   MRSA bacteremia Active Problems:   Essential hypertension   Protein-calorie malnutrition, severe   Metastatic lung carcinoma   CRI (chronic renal insufficiency)   Diabetic foot ulcer associated with type 2 diabetes mellitus   DVT, lower extremity, proximal, chronic   DM (diabetes mellitus), type 1 with peripheral vascular complications   Chronic systolic heart failure   COPD (chronic obstructive pulmonary disease)   Cigarette smoker   Normocytic anemia   Thrombocytopenia   Depression   Anxiety   Infection of venous access Port-a-catheter s/p removal 11/29/2013   Enteritis due to Clostridium difficile   Central line infection   . albuterol  2.5 mg Nebulization TID  . calcium carbonate  1,250 mg Oral TID  . carvedilol  9.375 mg Oral BID WC  . [START ON 12/05/2014] dexamethasone  4 mg Intravenous Q24H  . escitalopram  10 mg Oral Daily  . feeding supplement (ENSURE COMPLETE)  237 mL Oral BID BM  . furosemide  40 mg Oral Daily  . insulin aspart  0-5 Units Subcutaneous QHS  . insulin aspart  0-9 Units Subcutaneous TID WC  . isosorbide-hydrALAZINE  1 tablet Oral BID  . nicotine  7 mg Transdermal Daily  . pantoprazole  40 mg Oral Daily  . sodium chloride  3 mL Intravenous Q12H  . vancomycin  125 mg Oral 4 times per day    Subjective: He states that he is feeling better today. He is no longer having any problems with his breathing. He believes that was caused by laying flat on his back for too long yesterday. He has a very minimal cough productive of some white sputum that is unchanged. His appetite remains poor and he feels like his diarrhea is improving. He does not have any abdominal pain, nausea or vomiting.  Review of  Systems: Pertinent items are noted in HPI.  Past Medical History  Diagnosis Date  . Allergy   . Anxiety     followed [facility] every six months.  . Hypertension   . Diabetes mellitus without complication   . Metastatic lung carcinoma 09/16/2014    History  Substance Use Topics  . Smoking status: Current Some Day Smoker -- 0.75 packs/day for 36 years    Types: Cigarettes    Start date: 03/01/1979  . Smokeless tobacco: Never Used  . Alcohol Use: No    Family History  Problem Relation Age of Onset  . Heart disease Mother     AMI  . Cancer Sister     breast  . Heart disease Brother     AMI  . Cancer Brother   . Heart attack Mother   . Heart attack Brother   . Stroke Neg Hx    No Known Allergies  OBJECTIVE: Blood pressure 115/59, pulse 90, temperature 97.5 F (36.4 C), temperature source Oral, resp. rate 16, height 6\' 2"  (1.88 m), weight 204 lb 2.3 oz (92.6 kg), SpO2 95 %. General: He is alert and in good spirits watching television Skin: Left arm PICC site appears normal former Port-A-Cath site bandage clean and dry Lungs: Clear anteriorly Cor: Distant but  regular S1 and S2 with no murmurs Abdomen: Soft and nontender  Lab Results Lab Results  Component Value Date   WBC 15.1* 12/04/2014   HGB 8.7* 12/04/2014   HCT 27.0* 12/04/2014   MCV 90.0 12/04/2014   PLT 112* 12/04/2014    Lab Results  Component Value Date   CREATININE 2.85* 12/04/2014   BUN 63* 12/04/2014   NA 139 12/04/2014   K 4.5 12/04/2014   CL 104 12/04/2014   CO2 25 12/04/2014    Lab Results  Component Value Date   ALT 19 12/04/2014   AST 53* 12/04/2014   ALKPHOS 75 12/04/2014   BILITOT 0.8 12/04/2014     Microbiology: Recent Results (from the past 240 hour(s))  Culture, blood (routine x 2)     Status: None   Collection Time: 11/22/2014  7:00 PM  Result Value Ref Range Status   Specimen Description BLOOD LEFT ARM  Final   Special Requests BOTTLES DRAWN AEROBIC AND ANAEROBIC 10CC   Final   Culture   Final    STAPHYLOCOCCUS AUREUS Note: SUSCEPTIBILITIES PERFORMED ON PREVIOUS CULTURE WITHIN THE LAST 5 DAYS. Note: Gram Stain Report Called to,Read Back By and Verified With: [facility] ADAMSON 12/02/14 430AM [place] Performed at [facility]    Report Status 12/03/2014 FINAL  Final  Culture, blood (routine x 2)     Status: None   Collection Time: 12/04/2014  7:04 PM  Result Value Ref Range Status   Specimen Description BLOOD RIGHT HAND  Final   Special Requests BOTTLES DRAWN AEROBIC AND ANAEROBIC  10CC  Final   Culture   Final    METHICILLIN RESISTANT STAPHYLOCOCCUS AUREUS Note: RIFAMPIN AND GENTAMICIN SHOULD NOT BE USED AS SINGLE DRUGS FOR TREATMENT OF STAPH INFECTIONS. CRITICAL RESULT CALLED TO, READ BACK BY AND VERIFIED WITH: Laurance Flatten 12/01/14 1245 BY SMITHERSJ Note: Culture results may be compromised due to an inadequate volume of blood received in culture bottles. Gram Stain Report Called to,Read Back By and Verified With: GRACE F@9 :57AM ON 11/30/14 BY DANTS Performed at [facility]    Report Status 12/02/2014 FINAL  Final   Organism ID, Bacteria METHICILLIN RESISTANT STAPHYLOCOCCUS AUREUS  Final      Susceptibility   Methicillin resistant staphylococcus aureus - MIC*    CLINDAMYCIN >=8 RESISTANT Resistant     ERYTHROMYCIN >=8 RESISTANT Resistant     GENTAMICIN <=0.5 SENSITIVE Sensitive     LEVOFLOXACIN >=8 RESISTANT Resistant     OXACILLIN >=4 RESISTANT Resistant     PENICILLIN >=0.5 RESISTANT Resistant     RIFAMPIN <=0.5 SENSITIVE Sensitive     TRIMETH/SULFA <=10 SENSITIVE Sensitive     VANCOMYCIN 1 SENSITIVE Sensitive     TETRACYCLINE 2 SENSITIVE Sensitive     * METHICILLIN RESISTANT STAPHYLOCOCCUS AUREUS  MRSA PCR Screening     Status: Abnormal   Collection Time: 12/09/2014  5:50 AM  Result Value Ref Range Status   MRSA by PCR POSITIVE (A) NEGATIVE Final    Comment:        The GeneXpert MRSA Assay (FDA approved for NASAL specimens only),  is one component of a comprehensive MRSA colonization surveillance program. It is not intended to diagnose MRSA infection nor to guide or monitor treatment for MRSA infections. RESULT CALLED TO, READ BACK BY AND VERIFIED WITH: H. HOLDERNESS RN AT 0710 ON 01.13.16 BY SHUEA   Clostridium Difficile by PCR     Status: Abnormal   Collection Time: 11/30/2014  5:53 PM  Result  Value Ref Range Status   C difficile by pcr POSITIVE (A) NEGATIVE Final    Comment: CRITICAL RESULT CALLED TO, READ BACK BY AND VERIFIED WITH: SISON RN 11:10 11/30/14 ([email or web address]) Performed at [facility]   Culture, blood (routine x 2)     Status: None (Preliminary result)   Collection Time: 11/30/14 10:40 PM  Result Value Ref Range Status   Specimen Description BLOOD RIGHT ARM  Final   Special Requests BOTTLES DRAWN AEROBIC ONLY 7ML  Final   Culture   Final           BLOOD CULTURE RECEIVED NO GROWTH TO DATE CULTURE WILL BE HELD FOR 5 DAYS BEFORE ISSUING A FINAL NEGATIVE REPORT Performed at [facility]    Report Status PENDING  Incomplete  Culture, blood (routine x 2)     Status: None (Preliminary result)   Collection Time: 11/30/14 10:40 PM  Result Value Ref Range Status   Specimen Description BLOOD RIGHT FOREARM  Final   Special Requests BOTTLES DRAWN AEROBIC ONLY 4ML  Final   Culture   Final           BLOOD CULTURE RECEIVED NO GROWTH TO DATE CULTURE WILL BE HELD FOR 5 DAYS BEFORE ISSUING A FINAL NEGATIVE REPORT Performed at [facility]    Report Status PENDING  Incomplete    Assessment: He is improving on therapy for MRSA bacteremia and C. difficile colitis. His PICC line was changed 48 hours after his last positive blood cultures. I do not feel strongly that his current PICC needs to be replaced as long as he continues to improve.  Plan: 1. Continue IV vancomycin for 18 more days 2. Continue oral vancomycin for at least 9 more days  Michel Bickers, MD [facility] for [place] [phone] pager   [phone] cell 12/04/2014, 2:23 PM

## 2014-12-04 NOTE — Progress Notes (Signed)
ANTICOAGULATION CONSULT NOTE  Pharmacy Consult for Heparin Indication: VTE treatment  No Known Allergies  Patient Measurements: Height: 6\' 2"  (188 cm) Weight: 204 lb 2.3 oz (92.6 kg) IBW/kg (Calculated) : 82.2 Heparin Dosing Weight: actual weight  Vital Signs: Temp: 97.4 F (36.3 C) (01/18 0523) Temp Source: Oral (01/18 0523) BP: 121/52 mmHg (01/18 0523) Pulse Rate: 89 (01/18 0523)  Labs:  Recent Labs  12/02/14 0430 12/03/14 0355 12/03/14 1616 12/04/14 0435  HGB 8.9* 8.8*  --  8.7*  HCT 27.1* 27.2*  --  27.0*  PLT 86* 105*  --  112*  HEPARINUNFRC 0.35 0.26* 0.37 0.39  CREATININE 2.56* 2.73*  --  2.85*    Estimated Creatinine Clearance: 31.2 mL/min (by C-G formula based on Cr of 2.85).  Medications:  Infusions:  . dextrose 5 % and 0.45% NaCl 30 mL/hr at 12/03/14 2336  . heparin 1,750 Units/hr (12/03/14 1004)    Assessment: 3 YOM admitted 1/11 with metastatic lung cancer and history of LLE DVT with possible upper extremity DVT. He has been on Xarelto at [facility] rehab, initially 15mg  BID started on 12/22 with plan to change to 20mg  once daily dosing on 1/12, but on 1/4, order was restarted as Xarelto 15 mg BID x21d with change to 20mg  daily on 1/26.  Pharmacy is now consulted to dose IV Heparin for VTE.    Significant events: 1/11 Xarelto continued 1/12 Last Xarelto dose given ~ 8am.  Plan to change from Xarelto to Heparin, but start held d/t recent Xarelto and worsening renal function. 1/13 Heparin initiation held for infected PAC removed.  PICC line inserted. 1/14 Pharmacy is re-consulted to dose IV Heparin  Today, 12/04/2014  Heparin level 0.39, therapeutic level on heparin at 1750 units/hr.   Hgb decreasing again to 8.7, s/p transfusion 1/14.  Plt had been decreasing, but now improved to 112k today.   No active bleeding reported today per RN; noted bloody and purulent dressing changes 1/15. RN to monitor and report any bleeding from [facility] removal site during  dressing change (bleeding reported on 1/13)  SCr continues to rise to 2.85, CrCl ~ 32 ml/min  Goal of Therapy:  Heparin level 0.3-0.7 units/ml Monitor platelets by anticoagulation protocol: Yes   Plan:   Continue heparin IV infusion at 1750 units/hr.  Daily heparin level, CBC  Follow up transition back to Xarelto when/if renal function improves  Doreene Eland, PharmD, BCPS.   Pager: [phone] 12/04/2014 9:31 AM

## 2014-12-04 NOTE — Progress Notes (Signed)
[facility] PROGRESS NOTE  DAIL LEREW XQJ:[id number] DOB: 1951/06/18 DOA: 12/14/2014 PCP: No PCP Per Patient  Summary/Subjective: Appreciate Gen surgery/ID/Oncology/IR. Ralph [name] is a pleasant 64 y.o. male with a complex medical history including diabetes mellitus type 2, anxiety and depression, systolic heart failure with an EF of 25% back in October 2015,metastatic lung cancer on chemotherapy(now discontinued), who came in for generalized weakness and was found to have MRSA bacteremia(BC on 11/24/13). There was concern for port-cath/picc line infection, hence removal of both done. Blood cultures sent on 11/27/2013 still growing MRSA. Patient also had stool for C. difficile PCR sent on 11/29/13 and it is positive. His hemoglobin dropped to 7.7 g/dL prompting PRBC transfusion per Oncology(improved to 9.3 g/dL). He is on anticoagulation with IV heparin. Patient is on IV(to complete 3 weeks) and oral vancomycin per ID. He had 2-D echocardiogram which showed EF 45%, trace pericardial effusion and large pleural effusion and had ultrasound-guided drainage(1.3L of yellow colored fluid drained and labs pending). 2-D echo showed No obvious vegetations. He has no new complaints today. His white count is slightly increased, likely from steroids, which will be tapered rapidly in view of ongoing infection. He has rectal tube in place but appears to have less diarrhea. Patient has very complex history and he has long ways to go, especially given the metastatic lung CA. Overall prognosis is poor. I discussed this concern with his son [name] over the phone yesterday, and son seems to understand the gravity of the patient's condition. Will continue antibiotic management per ID. Patient needs a reliable alternative access before picc line pulled out(on Iv heparin and multiple iv antibiotics). Spoke with nursing who will attempt another peripheral line. Patient developed hypoglycemia yesterday, prompting addition  of low dose d5wns. Plan MRSA bacteremia/Right great toe ulcer/ Diabetic foot ulcer associated with type 2 diabetes mellitus/C. difficile colitis/large pleural effusion  On Vancomycin per pharmacy  Defer antibiotic management to ID.   Port-cath/Picc line removed and another PICC line inserted. ?remove current line as continued bacteremia.  Reviewed pleural fluid analysis.  Repeat blood cultures on 11/27/2013 positive for MRSA. Acute systolic congestive heart failure/?Cardiorenal syndrome/SOB: - Continue Lasix oral.  - Poor UOP. Restrict fluid, low sodium diet. Strict I and O's. - monitor electrolytes. -Quick steroid taper. DVT, lower extremity, proximal, chronic/Swollen right arm. - Iv heparin per pharmacy. Appreciate help.  - GFR < 30 hence Xarelto not ideal, use heparin DM (diabetes mellitus), type 1 with peripheral vascular complications - Sugars stable. - Cont SSI. Acute renal failure syndrome - No acute changes. Monitor. Metastatic lung carcinoma/anemia/thrombocytopenia - Defer to Oncology - Monitor hemoglobin Protein-calorie malnutrition, severe - Ensure TID Code Status: Full code Family Communication: Spoke to patient's son [name] over the [phone]). Disposition Plan: Patient says he wants to go home but his son [name] says that he should go back to their facility where he has been since October last year. Consultants:  ID/Surgery/Oncology/I is a hospital and light during the normal R  Procedures: Right foot MRI-Severe diffuse cellulitis and myofasciitis but no definite soft tissue abscess, septic arthritis or osteomyelitis  Antibiotics: Vancomycin 11/26/13>  Oral Vancomycin 11/30/13>  HPI/Subjective: Denies any specific complaints.  Objective: Filed Vitals:   12/04/14 1402  BP: 115/59  Pulse: 90  Temp: 97.5 F (36.4 C)  Resp: 16    Intake/Output Summary (Last 24 hours) at 12/04/14 1406 Last data filed at 12/04/14 1405  Gross per  24 hour  Intake    720 ml  Output   1125 ml  Net   -405 ml   Filed Weights   12/02/14 0500 12/03/14 0638 12/04/14 0523  Weight: 91.7 kg (202 lb 2.6 oz) 93.7 kg (206 lb 9.1 oz) 92.6 kg (204 lb 2.3 oz)    Exam:   General:  Frail.  Cardiovascular: S1S2 normal. No murmurs.  Respiratory: No wheezing  Abdomen: soft, non tender.  Musculoskeletal: Both feet swollen. No drainage from right big toe.   Data Reviewed: Basic Metabolic Panel:  Recent Labs Lab 11/30/14 0620 12/01/14 0615 12/02/14 0430 12/03/14 0355 12/04/14 0435  NA 139 135 135 135 139  K 4.8 4.4 4.1 4.0 4.5  CL 102 103 99 104 104  CO2 29 27 26 27 25   GLUCOSE 71 73 90 88 121*  BUN 67* 64* 64* 63* 63*  CREATININE 2.60* 2.60* 2.56* 2.73* 2.85*  CALCIUM 7.5* 7.0* 7.2* 7.2* 7.3*   Liver Function Tests:  Recent Labs Lab 11/30/14 0620 12/01/14 0615 12/02/14 0430 12/03/14 0355 12/04/14 0435  AST 38* 42* 43* 44* 53*  ALT 16 18 17 18 19   ALKPHOS 63 69 75 74 75  BILITOT 0.6 0.6 0.8 0.9 0.8  PROT 4.7* 4.8* 4.6* 4.7* 4.6*  ALBUMIN 1.3* 1.2* 1.2* 1.2* 1.1*   No results for input(s): LIPASE, AMYLASE in the last 168 hours. No results for input(s): AMMONIA in the last 168 hours. CBC:  Recent Labs Lab 12/01/2014 1904  11/30/14 0620 11/30/14 2246 12/01/14 0615 12/02/14 0430 12/03/14 0355 12/04/14 0435  WBC 20.4*  < > 15.4*  --  15.4* 13.2* 12.9* 15.1*  NEUTROABS 19.6*  --   --   --   --   --   --   --   HGB 9.5*  < > 7.7* 8.7* 9.3* 8.9* 8.8* 8.7*  HCT 29.7*  < > 23.5* 26.3* 28.6* 27.1* 27.2* 27.0*  MCV 89.5  < > 89.4  --  89.4 89.7 90.7 90.0  PLT 123*  < > 84*  --  91* 86* 105* 112*  < > = values in this interval not displayed. Cardiac Enzymes:  Recent Labs Lab 12/10/2014 1904 11/28/14 11/28/14 0610  TROPONINI 0.07* 0.06* 0.06*   BNP (last 3 results)  Recent Labs  09/12/14 0620 11/23/14 1619  PROBNP 8587.0* 409.0*   CBG:  Recent Labs Lab 12/03/14 1802 12/03/14 2044 12/04/14 0739  12/04/14 0905 12/04/14 1148  GLUCAP 82 88 132* 138* 131*    Recent Results (from the past 240 hour(s))  Culture, blood (routine x 2)     Status: None   Collection Time: 11/30/2014  7:00 PM  Result Value Ref Range Status   Specimen Description BLOOD LEFT ARM  Final   Special Requests BOTTLES DRAWN AEROBIC AND ANAEROBIC 10CC  Final   Culture   Final    STAPHYLOCOCCUS AUREUS Note: SUSCEPTIBILITIES PERFORMED ON PREVIOUS CULTURE WITHIN THE LAST 5 DAYS. Note: Gram Stain Report Called to,Read Back By and Verified With: [facility] ADAMSON 12/02/14 430AM [place] Performed at [facility]    Report Status 12/03/2014 FINAL  Final  Culture, blood (routine x 2)     Status: None   Collection Time: 11/18/2014  7:04 PM  Result Value Ref Range Status   Specimen Description BLOOD RIGHT HAND  Final   Special Requests BOTTLES DRAWN AEROBIC AND ANAEROBIC  10CC  Final   Culture   Final    METHICILLIN RESISTANT STAPHYLOCOCCUS AUREUS Note: RIFAMPIN AND GENTAMICIN SHOULD NOT BE USED AS SINGLE DRUGS FOR  TREATMENT OF STAPH INFECTIONS. CRITICAL RESULT CALLED TO, READ BACK BY AND VERIFIED WITH: Laurance Flatten 12/01/14 1245 BY SMITHERSJ Note: Culture results may be compromised due to an inadequate volume of blood received in culture bottles. Gram Stain Report Called to,Read Back By and Verified With: GRACE F@9 :57AM ON 11/30/14 BY DANTS Performed at [facility]    Report Status 12/02/2014 FINAL  Final   Organism ID, Bacteria METHICILLIN RESISTANT STAPHYLOCOCCUS AUREUS  Final      Susceptibility   Methicillin resistant staphylococcus aureus - MIC*    CLINDAMYCIN >=8 RESISTANT Resistant     ERYTHROMYCIN >=8 RESISTANT Resistant     GENTAMICIN <=0.5 SENSITIVE Sensitive     LEVOFLOXACIN >=8 RESISTANT Resistant     OXACILLIN >=4 RESISTANT Resistant     PENICILLIN >=0.5 RESISTANT Resistant     RIFAMPIN <=0.5 SENSITIVE Sensitive     TRIMETH/SULFA <=10 SENSITIVE Sensitive     VANCOMYCIN 1 SENSITIVE Sensitive      TETRACYCLINE 2 SENSITIVE Sensitive     * METHICILLIN RESISTANT STAPHYLOCOCCUS AUREUS  MRSA PCR Screening     Status: Abnormal   Collection Time: 12/08/2014  5:50 AM  Result Value Ref Range Status   MRSA by PCR POSITIVE (A) NEGATIVE Final    Comment:        The GeneXpert MRSA Assay (FDA approved for NASAL specimens only), is one component of a comprehensive MRSA colonization surveillance program. It is not intended to diagnose MRSA infection nor to guide or monitor treatment for MRSA infections. RESULT CALLED TO, READ BACK BY AND VERIFIED WITH: H. HOLDERNESS RN AT 0710 ON 01.13.16 BY SHUEA   Clostridium Difficile by PCR     Status: Abnormal   Collection Time: 12/02/2014  5:53 PM  Result Value Ref Range Status   C difficile by pcr POSITIVE (A) NEGATIVE Final    Comment: CRITICAL RESULT CALLED TO, READ BACK BY AND VERIFIED WITH: SISON RN 11:10 11/30/14 ([email or web address]) Performed at [facility]   Culture, blood (routine x 2)     Status: None (Preliminary result)   Collection Time: 11/30/14 10:40 PM  Result Value Ref Range Status   Specimen Description BLOOD RIGHT ARM  Final   Special Requests BOTTLES DRAWN AEROBIC ONLY 7ML  Final   Culture   Final           BLOOD CULTURE RECEIVED NO GROWTH TO DATE CULTURE WILL BE HELD FOR 5 DAYS BEFORE ISSUING A FINAL NEGATIVE REPORT Performed at [facility]    Report Status PENDING  Incomplete  Culture, blood (routine x 2)     Status: None (Preliminary result)   Collection Time: 11/30/14 10:40 PM  Result Value Ref Range Status   Specimen Description BLOOD RIGHT FOREARM  Final   Special Requests BOTTLES DRAWN AEROBIC ONLY 4ML  Final   Culture   Final           BLOOD CULTURE RECEIVED NO GROWTH TO DATE CULTURE WILL BE HELD FOR 5 DAYS BEFORE ISSUING A FINAL NEGATIVE REPORT Performed at [facility]    Report Status PENDING  Incomplete     Studies: Dg Chest Port 1 View  12/03/2014   CLINICAL DATA:  Shortness of breath,  hypoxia, hypertension, diabetes, metastatic lung cancer  EXAM: PORTABLE CHEST - 1 VIEW  COMPARISON:  Portable exam 1723 hr compared to 12/01/2014  FINDINGS: LEFT arm PICC line tip projects over cavoatrial junction.  Upper normal heart size.  Normal mediastinal contours and pulmonary vascularity.  Atelectasis versus consolidation LEFT lower lobe unchanged.  Probable RIGHT basilar pleural effusion with atelectasis versus infiltrate at RIGHT base.  Upper lungs clear.  Atherosclerotic calcification aorta.  No pneumothorax.  IMPRESSION: Little interval change in atelectasis versus consolidation in LEFT lower lobe, probable RIGHT pleural effusion and mild RIGHT basilar atelectasis versus infiltrate.   Electronically Signed   By: Lavonia Dana M.D.   On: 12/03/2014 17:43    Scheduled Meds: . albuterol  2.5 mg Nebulization TID  . calcium carbonate  1,250 mg Oral TID  . carvedilol  9.375 mg Oral BID WC  . [START ON 12/05/2014] dexamethasone  4 mg Intravenous Q24H  . escitalopram  10 mg Oral Daily  . feeding supplement (ENSURE COMPLETE)  237 mL Oral BID BM  . furosemide  40 mg Oral Daily  . insulin aspart  0-5 Units Subcutaneous QHS  . insulin aspart  0-9 Units Subcutaneous TID WC  . isosorbide-hydrALAZINE  1 tablet Oral BID  . nicotine  7 mg Transdermal Daily  . pantoprazole  40 mg Oral Daily  . sodium chloride  3 mL Intravenous Q12H  . vancomycin  125 mg Oral 4 times per day   Continuous Infusions: . dextrose 5 % and 0.45% NaCl 30 mL/hr at 12/03/14 2336  . heparin 1,750 Units/hr (12/03/14 1004)    Principal Problem:   MRSA bacteremia Active Problems:   Essential hypertension   Protein-calorie malnutrition, severe   Metastatic lung carcinoma   CRI (chronic renal insufficiency)   Diabetic foot ulcer associated with type 2 diabetes mellitus   DVT, lower extremity, proximal, chronic   DM (diabetes mellitus), type 1 with peripheral vascular complications   Chronic systolic heart failure   COPD  (chronic obstructive pulmonary disease)   Cigarette smoker   Normocytic anemia   Thrombocytopenia   Depression   Anxiety   Infection of venous access Port-a-catheter s/p removal 11/29/2013   Enteritis due to Clostridium difficile   Central line infection    Time spent: 25 minutes.    Ayden Apodaca  [facility] Pager [phone]. If 7PM-7AM, please contact night-coverage at [email or web address], password [facility] 12/04/2014, 2:06 PM  LOS: 7 days

## 2014-12-04 NOTE — Progress Notes (Signed)
5 Days Post-Op  Subjective: Sites look better.  Objective: Vital signs in last 24 hours: Temp:  [97.3 F (36.3 C)-98.2 F (36.8 C)] 97.4 F (36.3 C) (01/18 0523) Pulse Rate:  [85-89] 89 (01/18 0523) Resp:  [18-19] 18 (01/18 0523) BP: (106-121)/(49-52) 121/52 mmHg (01/18 0523) SpO2:  [92 %-95 %] 94 % (01/18 0644) Weight:  [92.6 kg (204 lb 2.3 oz)] 92.6 kg (204 lb 2.3 oz) (01/18 0523) Last BM Date: 12/03/14 480 PO recorded  5oo from rectal tube Afebrile, VSS Creatinine slowing increasing WBC is still up. Intake/Output from previous day: 01/17 0701 - 01/18 0700 In: 480 [P.O.:480] Out: 925 [Urine:425; Stool:500] Intake/Output this shift:    General appearance: alert, cooperative and no distress Skin: Skin color, texture, turgor normal. No rashes or lesions or open sites look much better.  Lab Results:   Recent Labs  12/03/14 0355 12/04/14 0435  WBC 12.9* 15.1*  HGB 8.8* 8.7*  HCT 27.2* 27.0*  PLT 105* 112*    BMET  Recent Labs  12/03/14 0355 12/04/14 0435  NA 135 139  K 4.0 4.5  CL 104 104  CO2 27 25  GLUCOSE 88 121*  BUN 63* 63*  CREATININE 2.73* 2.85*  CALCIUM 7.2* 7.3*   PT/INR No results for input(s): LABPROT, INR in the last 72 hours.   Recent Labs Lab 11/30/14 0620 12/01/14 0615 12/02/14 0430 12/03/14 0355 12/04/14 0435  AST 38* 42* 43* 44* 53*  ALT 16 18 17 18 19   ALKPHOS 63 69 75 74 75  BILITOT 0.6 0.6 0.8 0.9 0.8  PROT 4.7* 4.8* 4.6* 4.7* 4.6*  ALBUMIN 1.3* 1.2* 1.2* 1.2* 1.1*     Lipase  No results found for: LIPASE   Studies/Results: Dg Chest Port 1 View  12/03/2014   CLINICAL DATA:  Shortness of breath, hypoxia, hypertension, diabetes, metastatic lung cancer  EXAM: PORTABLE CHEST - 1 VIEW  COMPARISON:  Portable exam 1723 hr compared to 12/01/2014  FINDINGS: LEFT arm PICC line tip projects over cavoatrial junction.  Upper normal heart size.  Normal mediastinal contours and pulmonary vascularity.  Atelectasis versus consolidation  LEFT lower lobe unchanged.  Probable RIGHT basilar pleural effusion with atelectasis versus infiltrate at RIGHT base.  Upper lungs clear.  Atherosclerotic calcification aorta.  No pneumothorax.  IMPRESSION: Little interval change in atelectasis versus consolidation in LEFT lower lobe, probable RIGHT pleural effusion and mild RIGHT basilar atelectasis versus infiltrate.   Electronically Signed   By: Lavonia Dana M.D.   On: 12/03/2014 17:43    Medications: . albuterol  2.5 mg Nebulization TID  . calcium carbonate  1,250 mg Oral TID  . carvedilol  9.375 mg Oral BID WC  . dexamethasone  4 mg Intravenous Q12H  . escitalopram  10 mg Oral Daily  . feeding supplement (ENSURE COMPLETE)  237 mL Oral BID BM  . furosemide  40 mg Oral Daily  . insulin aspart  0-5 Units Subcutaneous QHS  . insulin aspart  0-9 Units Subcutaneous TID WC  . isosorbide-hydrALAZINE  1 tablet Oral BID  . nicotine  7 mg Transdermal Daily  . pantoprazole  40 mg Oral Daily  . sodium chloride  3 mL Intravenous Q12H  . vancomycin  125 mg Oral 4 times per day    Assessment/Plan 1. MRSA bacteremia with infected porta cath 2. Porta cath removal 12/08/2014, Dr. Johney Maine 3. Metastatic adenocarcinoma on chemotherapy, immunocompromised.  4. Acute renal failure 5. Type II diabetes 6. Systolic CHF, EF 62-37% 7. Hypertension  8. Hx of tobacco use 9. C diff colitis 9. Anxiety and depression   Plan:  Continue dressing changes, and antibiotic treatments.  We will see again Thursday if he is still here.  Follow up in our office in 3 weeks as needed.   LOS: 7 days    Ralph [name] 12/04/2014

## 2014-12-05 LAB — COMPREHENSIVE METABOLIC PANEL
ALT: 21 U/L (ref 0–53)
AST: 48 U/L — ABNORMAL HIGH (ref 0–37)
Albumin: 1.2 g/dL — ABNORMAL LOW (ref 3.5–5.2)
Alkaline Phosphatase: 88 U/L (ref 39–117)
Anion gap: 10 (ref 5–15)
BILIRUBIN TOTAL: 0.7 mg/dL (ref 0.3–1.2)
BUN: 70 mg/dL — AB (ref 6–23)
CHLORIDE: 104 meq/L (ref 96–112)
CO2: 24 mmol/L (ref 19–32)
Calcium: 7.4 mg/dL — ABNORMAL LOW (ref 8.4–10.5)
Creatinine, Ser: 3.03 mg/dL — ABNORMAL HIGH (ref 0.50–1.35)
GFR, EST AFRICAN AMERICAN: 24 mL/min — AB (ref >90)
GFR, EST NON AFRICAN AMERICAN: 21 mL/min — AB (ref >90)
Glucose, Bld: 219 mg/dL — ABNORMAL HIGH (ref 70–99)
POTASSIUM: 4.5 mmol/L (ref 3.5–5.1)
SODIUM: 138 mmol/L (ref 135–145)
Total Protein: 4.9 g/dL — ABNORMAL LOW (ref 6.0–8.3)

## 2014-12-05 LAB — CBC
HCT: 26.9 % — ABNORMAL LOW (ref 39.0–52.0)
Hemoglobin: 8.9 g/dL — ABNORMAL LOW (ref 13.0–17.0)
MCH: 29.5 pg (ref 26.0–34.0)
MCHC: 33.1 g/dL (ref 30.0–36.0)
MCV: 89.1 fL (ref 78.0–100.0)
Platelets: 108 10*3/uL — ABNORMAL LOW (ref 150–400)
RBC: 3.02 MIL/uL — ABNORMAL LOW (ref 4.22–5.81)
RDW: 20.6 % — ABNORMAL HIGH (ref 11.5–15.5)
WBC: 15.7 10*3/uL — ABNORMAL HIGH (ref 4.0–10.5)

## 2014-12-05 LAB — GLUCOSE, CAPILLARY
GLUCOSE-CAPILLARY: 217 mg/dL — AB (ref 70–99)
Glucose-Capillary: 210 mg/dL — ABNORMAL HIGH (ref 70–99)
Glucose-Capillary: 218 mg/dL — ABNORMAL HIGH (ref 70–99)
Glucose-Capillary: 235 mg/dL — ABNORMAL HIGH (ref 70–99)

## 2014-12-05 LAB — HEPARIN LEVEL (UNFRACTIONATED): HEPARIN UNFRACTIONATED: 0.48 [IU]/mL (ref 0.30–0.70)

## 2014-12-05 MED ORDER — SODIUM CHLORIDE 0.9 % IV SOLN: 250.0000 mL | INTRAVENOUS | Status: AC | PRN

## 2014-12-05 MED ORDER — DM-GUAIFENESIN ER 30-600 MG PO TB12
2.0000 | ORAL_TABLET | Freq: Two times a day (BID) | ORAL | Status: DC
  Administered 2014-12-05 – 2014-12-06 (×3): 2 via ORAL
  Filled 2014-12-05 (×5): qty 2

## 2014-12-05 MED ORDER — SODIUM CHLORIDE 0.9 % IV SOLN: 250.0000 mL | INTRAVENOUS | Status: DC | PRN

## 2014-12-05 MED ORDER — SODIUM CHLORIDE 1 G PO TABS: 1.0000 g | ORAL_TABLET | Freq: Every day | ORAL | Status: DC

## 2014-12-05 NOTE — Progress Notes (Signed)
Patient ID: Ralph [name], male   DOB: 06-16-1951, 64 y.o.   MRN: [id number]         [facility]    Date of Admission:  11/18/2014           Day 11 IV vancomycin        Day 6 oral vancomycin  Principal Problem:   MRSA bacteremia Active Problems:   Essential hypertension   Protein-calorie malnutrition, severe   Metastatic lung carcinoma   CRI (chronic renal insufficiency)   Diabetic foot ulcer associated with type 2 diabetes mellitus   DVT, lower extremity, proximal, chronic   DM (diabetes mellitus), type 1 with peripheral vascular complications   Chronic systolic heart failure   COPD (chronic obstructive pulmonary disease)   Cigarette smoker   Normocytic anemia   Thrombocytopenia   Depression   Anxiety   Infection of venous access Port-a-catheter s/p removal 11/29/2013   Enteritis due to Clostridium difficile   Central line infection   . albuterol  2.5 mg Nebulization TID  . calcium carbonate  1,250 mg Oral TID  . carvedilol  9.375 mg Oral BID WC  . dexamethasone  4 mg Intravenous Q24H  . escitalopram  10 mg Oral Daily  . feeding supplement (ENSURE COMPLETE)  237 mL Oral BID BM  . furosemide  40 mg Oral Daily  . insulin aspart  0-5 Units Subcutaneous QHS  . insulin aspart  0-9 Units Subcutaneous TID WC  . isosorbide-hydrALAZINE  1 tablet Oral BID  . nicotine  7 mg Transdermal Daily  . pantoprazole  40 mg Oral Daily  . sodium chloride  3 mL Intravenous Q12H  . vancomycin  125 mg Oral 4 times per day    Subjective: He states that he is better today. He is talking with a visitor.  Review of Systems: Pertinent items are noted in HPI.  Past Medical History  Diagnosis Date  . Allergy   . Anxiety     followed [facility]ry every six months.  . Hypertension   . Diabetes mellitus without complication   . Metastatic lung carcinoma 09/16/2014    History  Substance Use Topics  . Smoking status: Current Some Day Smoker -- 0.75  packs/day for 36 years    Types: Cigarettes    Start date: 03/01/1979  . Smokeless tobacco: Never Used  . Alcohol Use: No    Family History  Problem Relation Age of Onset  . Heart disease Mother     AMI  . Cancer Sister     breast  . Heart disease Brother     AMI  . Cancer Brother   . Heart attack Mother   . Heart attack Brother   . Stroke Neg Hx    No Known Allergies  OBJECTIVE: Blood pressure 138/74, pulse 83, temperature 97.4 F (36.3 C), temperature source Oral, resp. rate 20, height 6\' 2"  (1.88 m), weight 204 lb 9.4 oz (92.8 kg), SpO2 97 %.   General: He is alert and in good spirits  Skin: Left arm PICC site appears normal former Port-A-Cath site bandage clean and dry Lungs: Clear anteriorly Cor: Distant but regular S1 and S2 with no murmurs  Lab Results Lab Results  Component Value Date   WBC 15.7* 12/05/2014   HGB 8.9* 12/05/2014   HCT 26.9* 12/05/2014   MCV 89.1 12/05/2014   PLT 108* 12/05/2014    Lab Results  Component Value Date   CREATININE 3.03* 12/05/2014   BUN  70* 12/05/2014   NA 138 12/05/2014   K 4.5 12/05/2014   CL 104 12/05/2014   CO2 24 12/05/2014    Lab Results  Component Value Date   ALT 21 12/05/2014   AST 48* 12/05/2014   ALKPHOS 88 12/05/2014   BILITOT 0.7 12/05/2014     Microbiology: Recent Results (from the past 240 hour(s))  Culture, blood (routine x 2)     Status: None   Collection Time: 12/08/2014  7:00 PM  Result Value Ref Range Status   Specimen Description BLOOD LEFT ARM  Final   Special Requests BOTTLES DRAWN AEROBIC AND ANAEROBIC 10CC  Final   Culture   Final    STAPHYLOCOCCUS AUREUS Note: SUSCEPTIBILITIES PERFORMED ON PREVIOUS CULTURE WITHIN THE LAST 5 DAYS. Note: Gram Stain Report Called to,Read Back By and Verified With: [facility] ADAMSON 12/02/14 430AM [place] Performed at [facility]    Report Status 12/03/2014 FINAL  Final  Culture, blood (routine x 2)     Status: None   Collection Time: 12/15/2014  7:04 PM    Result Value Ref Range Status   Specimen Description BLOOD RIGHT HAND  Final   Special Requests BOTTLES DRAWN AEROBIC AND ANAEROBIC  10CC  Final   Culture   Final    METHICILLIN RESISTANT STAPHYLOCOCCUS AUREUS Note: RIFAMPIN AND GENTAMICIN SHOULD NOT BE USED AS SINGLE DRUGS FOR TREATMENT OF STAPH INFECTIONS. CRITICAL RESULT CALLED TO, READ BACK BY AND VERIFIED WITH: Laurance Flatten 12/01/14 1245 BY SMITHERSJ Note: Culture results may be compromised due to an inadequate volume of blood received in culture bottles. Gram Stain Report Called to,Read Back By and Verified With: GRACE F@9 :57AM ON 11/30/14 BY DANTS Performed at [facility]    Report Status 12/02/2014 FINAL  Final   Organism ID, Bacteria METHICILLIN RESISTANT STAPHYLOCOCCUS AUREUS  Final      Susceptibility   Methicillin resistant staphylococcus aureus - MIC*    CLINDAMYCIN >=8 RESISTANT Resistant     ERYTHROMYCIN >=8 RESISTANT Resistant     GENTAMICIN <=0.5 SENSITIVE Sensitive     LEVOFLOXACIN >=8 RESISTANT Resistant     OXACILLIN >=4 RESISTANT Resistant     PENICILLIN >=0.5 RESISTANT Resistant     RIFAMPIN <=0.5 SENSITIVE Sensitive     TRIMETH/SULFA <=10 SENSITIVE Sensitive     VANCOMYCIN 1 SENSITIVE Sensitive     TETRACYCLINE 2 SENSITIVE Sensitive     * METHICILLIN RESISTANT STAPHYLOCOCCUS AUREUS  MRSA PCR Screening     Status: Abnormal   Collection Time: 12/08/2014  5:50 AM  Result Value Ref Range Status   MRSA by PCR POSITIVE (A) NEGATIVE Final    Comment:        The GeneXpert MRSA Assay (FDA approved for NASAL specimens only), is one component of a comprehensive MRSA colonization surveillance program. It is not intended to diagnose MRSA infection nor to guide or monitor treatment for MRSA infections. RESULT CALLED TO, READ BACK BY AND VERIFIED WITH: H. HOLDERNESS RN AT 0710 ON 01.13.16 BY SHUEA   Clostridium Difficile by PCR     Status: Abnormal   Collection Time: 12/14/2014  5:53 PM  Result Value Ref  Range Status   C difficile by pcr POSITIVE (A) NEGATIVE Final    Comment: CRITICAL RESULT CALLED TO, READ BACK BY AND VERIFIED WITH: SISON RN 11:10 11/30/14 ([email or web address]) Performed at [facility]   Culture, blood (routine x 2)     Status: None (Preliminary result)   Collection Time: 11/30/14 10:40 PM  Result Value Ref Range Status   Specimen Description BLOOD RIGHT ARM  Final   Special Requests BOTTLES DRAWN AEROBIC ONLY 7ML  Final   Culture   Final           BLOOD CULTURE RECEIVED NO GROWTH TO DATE CULTURE WILL BE HELD FOR 5 DAYS BEFORE ISSUING A FINAL NEGATIVE REPORT Performed at [facility]    Report Status PENDING  Incomplete  Culture, blood (routine x 2)     Status: None (Preliminary result)   Collection Time: 11/30/14 10:40 PM  Result Value Ref Range Status   Specimen Description BLOOD RIGHT FOREARM  Final   Special Requests BOTTLES DRAWN AEROBIC ONLY 4ML  Final   Culture   Final           BLOOD CULTURE RECEIVED NO GROWTH TO DATE CULTURE WILL BE HELD FOR 5 DAYS BEFORE ISSUING A FINAL NEGATIVE REPORT Performed at [facility]    Report Status PENDING  Incomplete    Assessment: He is improving on therapy for MRSA bacteremia and C. difficile colitis.   Plan: 1. Continue IV vancomycin for 17 more days 2. Continue oral vancomycin for at least 8 more days  Michel Bickers, MD [facility] for [place] [phone] pager   [phone] cell 12/05/2014, 2:09 PM

## 2014-12-05 NOTE — Progress Notes (Signed)
ANTICOAGULATION CONSULT NOTE  Pharmacy Consult for Heparin Indication: VTE treatment  No Known Allergies  Patient Measurements: Height: 6\' 2"  (188 cm) Weight: 204 lb 9.4 oz (92.8 kg) IBW/kg (Calculated) : 82.2 Heparin Dosing Weight: actual weight  Vital Signs: Temp: 97.4 F (36.3 C) (01/19 0604) Temp Source: Oral (01/19 0604) BP: 138/74 mmHg (01/19 0604) Pulse Rate: 83 (01/19 0604)  Labs:  Recent Labs  12/03/14 0355 12/03/14 1616 12/04/14 0435 12/05/14 0500  HGB 8.8*  --  8.7* 8.9*  HCT 27.2*  --  27.0* 26.9*  PLT 105*  --  112* 108*  HEPARINUNFRC 0.26* 0.37 0.39 0.48  CREATININE 2.73*  --  2.85* 3.03*    Estimated Creatinine Clearance: 29.4 mL/min (by C-G formula based on Cr of 3.03).  Medications:  Infusions:  . dextrose 5 % and 0.45% NaCl 30 mL/hr at 12/05/14 0100  . heparin 1,750 Units/hr (12/04/14 2007)    Assessment: 54 YOM admitted 1/11 with metastatic lung cancer and history of LLE DVT with possible upper extremity DVT. He has been on Xarelto at [facility] rehab, initially 15mg  BID started on 12/22 with plan to change to 20mg  once daily dosing on 1/12, but on 1/4, order was restarted as Xarelto 15 mg BID x21d with change to 20mg  daily on 1/26.  Pharmacy is now consulted to dose IV Heparin for VTE.    Significant events: 1/11 Xarelto continued 1/12 Last Xarelto dose given ~ 8am.  Plan to change from Xarelto to Heparin, but start held d/t recent Xarelto and worsening renal function. 1/13 Heparin initiation held for infected PAC removed.  PICC line inserted. 1/14 Pharmacy is re-consulted to dose IV Heparin  Today, 12/05/2014  Heparin level 0.48, therapeutic level on heparin at 1750 units/hr.   Hgb stable at 8.9, s/p transfusion 1/14.  Plt had been decreasing, but now stable at 108.   No active bleeding reported today per RN; noted bloody and purulent dressing changes 1/15. RN to monitor and report any bleeding from [facility] removal site during dressing change  (bleeding reported on 1/13)  SCr continues to rise to 3.03rCl ~ 30m/min  Goal of Therapy:  Heparin level 0.3-0.7 units/ml Monitor platelets by anticoagulation protocol: Yes   Plan:   Continue heparin IV infusion at 1750 units/hr.  Daily heparin level, CBC  Follow up transition back to Xarelto when/if renal function improves  Per mfg, his current renal function (CrCL<30) negates use of xarelto for DVT/PE tx.  Suggest consider transitioning to alternative anticoagulation (like warfarin, altenatively LMWH)  Doreene Eland, PharmD, BCPS.   Pager: [phone] 12/05/2014 10:20 AM

## 2014-12-05 NOTE — Progress Notes (Signed)
RN noticed bed pads and bed linens are soiled. RN asked and tried to change linens but pt refused. Patient requested to leave him alone and let him sleep. Will ask in the morning. WIll continue to monitor.

## 2014-12-05 NOTE — Progress Notes (Signed)
[facility] PROGRESS NOTE  Ralph [name] TGG:[id number] DOB: [birth year] DOA: 12/14/2014 PCP: No PCP Per Patient  Summary/Subjective: Appreciate Gen surgery/ID/Oncology/IR. Ralph [name] is a pleasant 64 y.o. male with a complex medical history including diabetes mellitus type 2, anxiety and depression, systolic heart failure with an EF of 25% back in October 2015,metastatic lung cancer on chemotherapy(now discontinued), who came in for generalized weakness and was found to have MRSA bacteremia(BC on 11/24/13). There was concern for port-cath/picc line infection, hence removal of both done. Blood cultures sent on 11/27/2013 still growing MRSA. Patient also had stool for C. difficile PCR sent on 11/29/13 and it is positive. His hemoglobin dropped to 7.7 g/dL prompting PRBC transfusion per Oncology(improved to 9.3 g/dL). He is on anticoagulation with IV heparin. Patient is on IV(to complete 3 weeks) and oral vancomycin per ID. He had 2-D echocardiogram which showed EF 45%, trace pericardial effusion and large pleural effusion and had ultrasound-guided drainage(1.3L of yellow colored fluid drained and labs pending). 2-D echo showed No obvious vegetations. His white count is slightly increased, likely from steroids, which will be discontinued today. He has rectal tube in place and has less diarrhea. Patient has very complex history and he has long ways to go, especially given the metastatic lung CA. Overall prognosis is poor. I discussed this concern with his son Ralph [name] over the phone on 12/03/14, and son seems to understand the gravity of the patient's condition. Will continue antibiotic management per ID(patient needs 17 more days of IV vancomycin). Patient had PICC line removed on 12/04/2014 patient ID recommendation with plans to replace the PICC line at least after 3 days of blood cultures showing no growth. Patient's renal function is slightly increased today with BUN/creatinine 70/3.03.we'll give gentle  fluids but maintain on Lasix .   Plan MRSA bacteremia/Right great toe ulcer/ Diabetic foot ulcer associated with type 2 diabetes mellitus/C. difficile colitis/large pleural effusion  On Vancomycin per pharmacy  Defer antibiotic management to ID.   Port-cath/Picc line removed and another PICC line inserted and removed on 12/04/14.   Reviewed pleural fluid analysis.  Repeat blood cultures on 11/27/2013 positive for MRSA. Acute systolic congestive heart failure/?Cardiorenal syndrome/SOB: - Continue Lasix oral.  - Poor UOP. Restrict fluid, low sodium diet. Strict I and O's. - monitor electrolytes. - Discontinued steroids . DVT, lower extremity, proximal, chronic/Swollen right arm. - Iv heparin per pharmacy. Appreciate help.  - GFR < 30 hence Xarelto not ideal, use heparin DM (diabetes mellitus), type 1 with peripheral vascular complications - Sugars stable. - Cont SSI. Acute renal failure syndrome - slight bump in BUN/creatinine. - Gentle fluids Metastatic lung carcinoma/anemia/thrombocytopenia - Defer to Oncology - Monitor hemoglobin Protein-calorie malnutrition, severe - Ensure TID Code Status: Full code Family Communication: Spoke to patient's son Ralph [name] over the [phone]). Disposition Plan: Patient says he wants to go home but his son Ralph [name] says that he should go back to their facility where he has been since October last year. Consultants:  ID/Surgery/Oncology/I is a hospital and light during the normal R  Procedures: Right foot MRI-Severe diffuse cellulitis and myofasciitis but no definite soft tissue abscess, septic arthritis or osteomyelitis  Antibiotics: Vancomycin 11/26/13>  Oral Vancomycin 11/30/13>  )  HPI/Subjective: Denies any complaints  Objective: Filed Vitals:   12/05/14 1421  BP: 125/62  Pulse: 77  Temp: 97.3 F (36.3 C)  Resp: 119    Intake/Output Summary (Last 24 hours) at 12/05/14 1815 Last data filed at 12/05/14  1500  Gross per 24 hour  Intake 1154.17 ml  Output   1200 ml  Net -45.83 ml   Filed Weights   12/03/14 0638 12/04/14 0523 12/05/14 0500  Weight: 93.7 kg (206 lb 9.1 oz) 92.6 kg (204 lb 2.3 oz) 92.8 kg (204 lb 9.4 oz)    Exam:   General: Frail  Cardiovascular: RRR. No Murmurs. S1S2 normal.  Respiratory: No wheezing. Scant rhonchi  Abdomen: Soft and nontender  Musculoskeletal: No drainage right big toe. Edema right leg.   Data Reviewed: Basic Metabolic Panel:  Recent Labs Lab 12/01/14 0615 12/02/14 0430 12/03/14 0355 12/04/14 0435 12/05/14 0500  NA 135 135 135 139 138  K 4.4 4.1 4.0 4.5 4.5  CL 103 99 104 104 104  CO2 27 26 27 25 24   GLUCOSE 73 90 88 121* 219*  BUN 64* 64* 63* 63* 70*  CREATININE 2.60* 2.56* 2.73* 2.85* 3.03*  CALCIUM 7.0* 7.2* 7.2* 7.3* 7.4*   Liver Function Tests:  Recent Labs Lab 12/01/14 0615 12/02/14 0430 12/03/14 0355 12/04/14 0435 12/05/14 0500  AST 42* 43* 44* 53* 48*  ALT 18 17 18 19 21   ALKPHOS 69 75 74 75 88  BILITOT 0.6 0.8 0.9 0.8 0.7  PROT 4.8* 4.6* 4.7* 4.6* 4.9*  ALBUMIN 1.2* 1.2* 1.2* 1.1* 1.2*   No results for input(s): LIPASE, AMYLASE in the last 168 hours. No results for input(s): AMMONIA in the last 168 hours. CBC:  Recent Labs Lab 12/01/14 0615 12/02/14 0430 12/03/14 0355 12/04/14 0435 12/05/14 0500  WBC 15.4* 13.2* 12.9* 15.1* 15.7*  HGB 9.3* 8.9* 8.8* 8.7* 8.9*  HCT 28.6* 27.1* 27.2* 27.0* 26.9*  MCV 89.4 89.7 90.7 90.0 89.1  PLT 91* 86* 105* 112* 108*   Cardiac Enzymes: No results for input(s): CKTOTAL, CKMB, CKMBINDEX, TROPONINI in the last 168 hours. BNP (last 3 results)  Recent Labs  09/12/14 0620 11/23/14 1619  PROBNP 8587.0* 409.0*   CBG:  Recent Labs Lab 12/04/14 1148 12/04/14 2132 12/05/14 0741 12/05/14 1117 12/05/14 1654  GLUCAP 131* 204* 217* 210* 218*    Recent Results (from the past 240 hour(s))  Culture, blood (routine x 2)     Status: None   Collection Time:  12/09/2014  7:00 PM  Result Value Ref Range Status   Specimen Description BLOOD LEFT ARM  Final   Special Requests BOTTLES DRAWN AEROBIC AND ANAEROBIC 10CC  Final   Culture   Final    STAPHYLOCOCCUS AUREUS Note: SUSCEPTIBILITIES PERFORMED ON PREVIOUS CULTURE WITHIN THE LAST 5 DAYS. Note: Gram Stain Report Called to,Read Back By and Verified With: [facility] ADAMSON 12/02/14 430AM [place] Performed at [facility]    Report Status 12/03/2014 FINAL  Final  Culture, blood (routine x 2)     Status: None   Collection Time: 12/09/2014  7:04 PM  Result Value Ref Range Status   Specimen Description BLOOD RIGHT HAND  Final   Special Requests BOTTLES DRAWN AEROBIC AND ANAEROBIC  10CC  Final   Culture   Final    METHICILLIN RESISTANT STAPHYLOCOCCUS AUREUS Note: RIFAMPIN AND GENTAMICIN SHOULD NOT BE USED AS SINGLE DRUGS FOR TREATMENT OF STAPH INFECTIONS. CRITICAL RESULT CALLED TO, READ BACK BY AND VERIFIED WITH: Laurance Flatten 12/01/14 1245 BY SMITHERSJ Note: Culture results may be compromised due to an inadequate volume of blood received in culture bottles. Gram Stain Report Called to,Read Back By and Verified With: GRACE F@9 :57AM ON 11/30/14 BY DANTS Performed at [facility]    Report Status  12/02/2014 FINAL  Final   Organism ID, Bacteria METHICILLIN RESISTANT STAPHYLOCOCCUS AUREUS  Final      Susceptibility   Methicillin resistant staphylococcus aureus - MIC*    CLINDAMYCIN >=8 RESISTANT Resistant     ERYTHROMYCIN >=8 RESISTANT Resistant     GENTAMICIN <=0.5 SENSITIVE Sensitive     LEVOFLOXACIN >=8 RESISTANT Resistant     OXACILLIN >=4 RESISTANT Resistant     PENICILLIN >=0.5 RESISTANT Resistant     RIFAMPIN <=0.5 SENSITIVE Sensitive     TRIMETH/SULFA <=10 SENSITIVE Sensitive     VANCOMYCIN 1 SENSITIVE Sensitive     TETRACYCLINE 2 SENSITIVE Sensitive     * METHICILLIN RESISTANT STAPHYLOCOCCUS AUREUS  MRSA PCR Screening     Status: Abnormal   Collection Time: 12/05/2014  5:50 AM   Result Value Ref Range Status   MRSA by PCR POSITIVE (A) NEGATIVE Final    Comment:        The GeneXpert MRSA Assay (FDA approved for NASAL specimens only), is one component of a comprehensive MRSA colonization surveillance program. It is not intended to diagnose MRSA infection nor to guide or monitor treatment for MRSA infections. RESULT CALLED TO, READ BACK BY AND VERIFIED WITH: H. HOLDERNESS RN AT 0710 ON 01.13.16 BY SHUEA   Clostridium Difficile by PCR     Status: Abnormal   Collection Time: 11/20/2014  5:53 PM  Result Value Ref Range Status   C difficile by pcr POSITIVE (A) NEGATIVE Final    Comment: CRITICAL RESULT CALLED TO, READ BACK BY AND VERIFIED WITH: SISON RN 11:10 11/30/14 ([email or web address]) Performed at [facility]   Culture, blood (routine x 2)     Status: None (Preliminary result)   Collection Time: 11/30/14 10:40 PM  Result Value Ref Range Status   Specimen Description BLOOD RIGHT ARM  Final   Special Requests BOTTLES DRAWN AEROBIC ONLY 7ML  Final   Culture   Final           BLOOD CULTURE RECEIVED NO GROWTH TO DATE CULTURE WILL BE HELD FOR 5 DAYS BEFORE ISSUING A FINAL NEGATIVE REPORT Performed at [facility]    Report Status PENDING  Incomplete  Culture, blood (routine x 2)     Status: None (Preliminary result)   Collection Time: 11/30/14 10:40 PM  Result Value Ref Range Status   Specimen Description BLOOD RIGHT FOREARM  Final   Special Requests BOTTLES DRAWN AEROBIC ONLY 4ML  Final   Culture   Final           BLOOD CULTURE RECEIVED NO GROWTH TO DATE CULTURE WILL BE HELD FOR 5 DAYS BEFORE ISSUING A FINAL NEGATIVE REPORT Performed at [facility]    Report Status PENDING  Incomplete     Studies: No results found.  Scheduled Meds: . albuterol  2.5 mg Nebulization TID  . calcium carbonate  1,250 mg Oral TID  . carvedilol  9.375 mg Oral BID WC  . escitalopram  10 mg Oral Daily  . feeding supplement (ENSURE COMPLETE)  237 mL Oral BID  BM  . furosemide  40 mg Oral Daily  . insulin aspart  0-5 Units Subcutaneous QHS  . insulin aspart  0-9 Units Subcutaneous TID WC  . isosorbide-hydrALAZINE  1 tablet Oral BID  . nicotine  7 mg Transdermal Daily  . pantoprazole  40 mg Oral Daily  . sodium chloride  3 mL Intravenous Q12H  . vancomycin  125 mg Oral 4 times per day  Continuous Infusions: . dextrose 5 % and 0.45% NaCl 30 mL/hr at 12/05/14 0100  . heparin 1,750 Units/hr (12/05/14 1108)    Principal Problem:   MRSA bacteremia Active Problems:   Essential hypertension   Protein-calorie malnutrition, severe   Metastatic lung carcinoma   CRI (chronic renal insufficiency)   Diabetic foot ulcer associated with type 2 diabetes mellitus   DVT, lower extremity, proximal, chronic   DM (diabetes mellitus), type 1 with peripheral vascular complications   Chronic systolic heart failure   COPD (chronic obstructive pulmonary disease)   Cigarette smoker   Normocytic anemia   Thrombocytopenia   Depression   Anxiety   Infection of venous access Port-a-catheter s/p removal 11/29/2013   Enteritis due to Clostridium difficile   Central line infection    Time spent: 25 minutes    Hershell Brandl  [facility] Pager [phone]. If 7PM-7AM, please contact night-coverage at [email or web address], password [facility] 12/05/2014, 6:15 PM  LOS: 8 days

## 2014-12-06 DIAGNOSIS — I82509 Chronic embolism and thrombosis of unspecified deep veins of unspecified lower extremity: Secondary | ICD-10-CM

## 2014-12-06 DIAGNOSIS — N184 Chronic kidney disease, stage 4 (severe): Secondary | ICD-10-CM

## 2014-12-06 DIAGNOSIS — D630 Anemia in neoplastic disease: Secondary | ICD-10-CM

## 2014-12-06 LAB — GLUCOSE, CAPILLARY
GLUCOSE-CAPILLARY: 212 mg/dL — AB (ref 70–99)
Glucose-Capillary: 166 mg/dL — ABNORMAL HIGH (ref 70–99)
Glucose-Capillary: 127 mg/dL — ABNORMAL HIGH (ref 70–99)
Glucose-Capillary: 143 mg/dL — ABNORMAL HIGH (ref 70–99)
Glucose-Capillary: 111 mg/dL — ABNORMAL HIGH (ref 70–99)

## 2014-12-06 LAB — COMPREHENSIVE METABOLIC PANEL
ALBUMIN: 1.2 g/dL — AB (ref 3.5–5.2)
ALK PHOS: 87 U/L (ref 39–117)
ALT: 25 U/L (ref 0–53)
ANION GAP: 12 (ref 5–15)
AST: 58 U/L — ABNORMAL HIGH (ref 0–37)
BUN: 74 mg/dL — AB (ref 6–23)
CHLORIDE: 102 meq/L (ref 96–112)
CO2: 24 mmol/L (ref 19–32)
Calcium: 7.3 mg/dL — ABNORMAL LOW (ref 8.4–10.5)
Creatinine, Ser: 2.95 mg/dL — ABNORMAL HIGH (ref 0.50–1.35)
GFR calc Af Amer: 25 mL/min — ABNORMAL LOW (ref >90)
GFR calc non Af Amer: 21 mL/min — ABNORMAL LOW (ref >90)
Glucose, Bld: 218 mg/dL — ABNORMAL HIGH (ref 70–99)
POTASSIUM: 4.2 mmol/L (ref 3.5–5.1)
SODIUM: 138 mmol/L (ref 135–145)
TOTAL PROTEIN: 4.8 g/dL — AB (ref 6.0–8.3)
Total Bilirubin: 0.6 mg/dL (ref 0.3–1.2)

## 2014-12-06 LAB — CBC
HEMATOCRIT: 29.2 % — AB (ref 39.0–52.0)
HEMOGLOBIN: 9.2 g/dL — AB (ref 13.0–17.0)
MCH: 28 pg (ref 26.0–34.0)
MCHC: 31.5 g/dL (ref 30.0–36.0)
MCV: 89 fL (ref 78.0–100.0)
Platelets: 95 10*3/uL — ABNORMAL LOW (ref 150–400)
RBC: 3.28 MIL/uL — ABNORMAL LOW (ref 4.22–5.81)
RDW: 20.4 % — ABNORMAL HIGH (ref 11.5–15.5)
WBC: 12.9 10*3/uL — ABNORMAL HIGH (ref 4.0–10.5)

## 2014-12-06 LAB — HEPARIN LEVEL (UNFRACTIONATED): Heparin Unfractionated: 0.44 IU/mL (ref 0.30–0.70)

## 2014-12-06 LAB — VANCOMYCIN, RANDOM: Vancomycin Rm: 14.6 ug/mL

## 2014-12-06 MED ORDER — WARFARIN - PHARMACIST DOSING INPATIENT: Freq: Every day | Status: DC

## 2014-12-06 MED ORDER — SODIUM CHLORIDE 0.9 % IJ SOLN: 10.0000 mL | INTRAMUSCULAR | Status: DC | PRN

## 2014-12-06 MED ORDER — WARFARIN VIDEO: Freq: Once | Status: DC

## 2014-12-06 MED ORDER — VANCOMYCIN HCL 10 G IV SOLR: 1250.0000 mg | INTRAVENOUS | Status: DC

## 2014-12-06 MED ORDER — VANCOMYCIN HCL 10 G IV SOLR
1250.0000 mg | Freq: Once | INTRAVENOUS | Status: AC
  Administered 2014-12-06: 1250 mg via INTRAVENOUS
  Filled 2014-12-06: qty 1250

## 2014-12-06 MED ORDER — COUMADIN BOOK
Freq: Once | Status: AC
  Administered 2014-12-06: 22:00:00
  Filled 2014-12-06: qty 1

## 2014-12-06 MED ORDER — WARFARIN SODIUM 5 MG PO TABS
5.0000 mg | ORAL_TABLET | Freq: Once | ORAL | Status: AC
  Administered 2014-12-06: 5 mg via ORAL
  Filled 2014-12-06: qty 1

## 2014-12-06 NOTE — Progress Notes (Signed)
KAMDEN STANISLAW   DOB:October 28, 1951   TI#:[id number]   QQP#:[id number]  Patient Care Team: No Pcp Per Patient as PCP - General (General Practice) Volanda Napoleon, MD as Consulting Physician (Oncology)  I have seen the patient, examined him and edited the notes as follows  Subjective: Patient seen and examined. Lying in bed comfortably. No new issues overnight. Afebrile. Weak appearing. Denies shortness of breath. Denies chest pain or palpitations. He denies any appetite changes. Diarrhea present, watery. Has rectal tube. Denies bleeding issues such as epistaxis, hematemesis, hematuria or hematochezia. No confusion reported.   Scheduled Meds: . albuterol  2.5 mg Nebulization TID  . calcium carbonate  1,250 mg Oral TID  . carvedilol  9.375 mg Oral BID WC  . dextromethorphan-guaiFENesin  2 tablet Oral BID  . escitalopram  10 mg Oral Daily  . feeding supplement (ENSURE COMPLETE)  237 mL Oral BID BM  . insulin aspart  0-5 Units Subcutaneous QHS  . insulin aspart  0-9 Units Subcutaneous TID WC  . isosorbide-hydrALAZINE  1 tablet Oral BID  . nicotine  7 mg Transdermal Daily  . pantoprazole  40 mg Oral Daily  . sodium chloride  3 mL Intravenous Q12H  . [START ON 12/09/2014] vancomycin  1,250 mg Intravenous Q72H  . vancomycin  125 mg Oral 4 times per day   Continuous Infusions: . dextrose 5 % and 0.45% NaCl 30 mL/hr at 12/06/14 0607  . heparin 1,750 Units/hr (12/06/14 1244)   PRN Meds:sodium chloride, acetaminophen, albuterol, clonazePAM, morphine injection, ondansetron **OR** ondansetron (ZOFRAN) IV, polyethylene glycol, promethazine, sodium chloride, sodium chloride, traMADol   Objective:  Filed Vitals:   12/06/14 0601  BP: 126/72  Pulse: 90  Temp: 97.5 F (36.4 C)  Resp: 18      Intake/Output Summary (Last 24 hours) at 12/06/14 1334 Last data filed at 12/06/14 7124  Gross per 24 hour  Intake 778.54 ml  Output    500 ml  Net 278.54 ml    ECOG PERFORMANCE STATUS:  3  GENERAL:alert, no distress and comfortable. He looks ill appearing SKIN: left upper extremity picc site normal. Noted a significant ulcer in both feet. EYES: normal, conjunctiva are pale and non-injected, sclera clear OROPHARYNX:no exudate, no erythema and lips, buccal mucosa, and tongue normal . Several missing teeth. Poor dentition NECK: supple, thyroid normal size, non-tender, without nodularity LYMPH:  no palpable lymphadenopathy in the cervical, axillary or inguinal LUNGS:decreased breath sounds bilaterally at the bases. Trace rhonchi audible.  HEART: regular rate & rhythm and no murmurs and 2-3+ bilateral lower extremity edema. Right great toe bandaged ABDOMEN:abdomen soft, non-tender and active bowel sounds Musculoskeletal:no cyanosis of digits and no clubbing PSYCH: alert & oriented x 3 with fluent speech NEURO: no focal motor/sensory deficits    CBG (last 3)   Recent Labs  12/05/14 2113 12/06/14 0736 12/06/14 1242  GLUCAP 235* 212* 127*     Labs:   Recent Labs Lab 12/02/14 0430 12/03/14 0355 12/04/14 0435 12/05/14 0500 12/06/14 0500  WBC 13.2* 12.9* 15.1* 15.7* 12.9*  HGB 8.9* 8.8* 8.7* 8.9* 9.2*  HCT 27.1* 27.2* 27.0* 26.9* 29.2*  PLT 86* 105* 112* 108* 95*  MCV 89.7 90.7 90.0 89.1 89.0  MCH 29.5 29.3 29.0 29.5 28.0  MCHC 32.8 32.4 32.2 33.1 31.5  RDW 20.9* 20.7* 20.7* 20.6* 20.4*     Chemistries:    Recent Labs Lab 12/02/14 0430 12/03/14 0355 12/04/14 0435 12/05/14 0500 12/06/14 0505  NA 135 135 139 138 138  K 4.1 4.0 4.5 4.5 4.2  CL 99 104 104 104 102  CO2 26 27 25 24 24   GLUCOSE 90 88 121* 219* 218*  BUN 64* 63* 63* 70* 74*  CREATININE 2.56* 2.73* 2.85* 3.03* 2.95*  CALCIUM 7.2* 7.2* 7.3* 7.4* 7.3*  AST 43* 44* 53* 48* 58*  ALT 17 18 19 21 25   ALKPHOS 75 74 75 88 87  BILITOT 0.8 0.9 0.8 0.7 0.6    GFR Estimated Creatinine Clearance: 30.2 mL/min (by C-G formula based on Cr of 2.95).  Liver Function Tests:  Recent Labs Lab  12/02/14 0430 12/03/14 0355 12/04/14 0435 12/05/14 0500 12/06/14 0505  AST 43* 44* 53* 48* 58*  ALT 17 18 19 21 25   ALKPHOS 75 74 75 88 87  BILITOT 0.8 0.9 0.8 0.7 0.6  PROT 4.6* 4.7* 4.6* 4.9* 4.8*  ALBUMIN 1.2* 1.2* 1.1* 1.2* 1.2*     Coagulation profile  Recent Labs Lab 11/30/14 0620 12/01/14 0615  INR 1.25 1.17   CBG:  Recent Labs Lab 12/05/14 1117 12/05/14 1654 12/05/14 2113 12/06/14 0736 12/06/14 1242  GLUCAP 210* 218* 235* 212* 127*  Microbiology Recent Results (from the past 240 hour(s))  Culture, blood (routine x 2)     Status: None   Collection Time: 12/06/2014  7:00 PM  Result Value Ref Range Status   Specimen Description BLOOD LEFT ARM  Final   Special Requests BOTTLES DRAWN AEROBIC AND ANAEROBIC 10CC  Final   Culture   Final    STAPHYLOCOCCUS AUREUS Note: SUSCEPTIBILITIES PERFORMED ON PREVIOUS CULTURE WITHIN THE LAST 5 DAYS. Note: Gram Stain Report Called to,Read Back By and Verified With: [facility] ADAMSON 12/02/14 430AM [place] Performed at [facility]    Report Status 12/03/2014 FINAL  Final  Culture, blood (routine x 2)     Status: None   Collection Time: 12/15/2014  7:04 PM  Result Value Ref Range Status   Specimen Description BLOOD RIGHT HAND  Final   Special Requests BOTTLES DRAWN AEROBIC AND ANAEROBIC  10CC  Final   Culture   Final    METHICILLIN RESISTANT STAPHYLOCOCCUS AUREUS Note: RIFAMPIN AND GENTAMICIN SHOULD NOT BE USED AS SINGLE DRUGS FOR TREATMENT OF STAPH INFECTIONS. CRITICAL RESULT CALLED TO, READ BACK BY AND VERIFIED WITH: Laurance Flatten 12/01/14 1245 BY SMITHERSJ Note: Culture results may be compromised due to an inadequate volume of blood received in culture bottles. Gram Stain Report Called to,Read Back By and Verified With: GRACE F@9 :57AM ON 11/30/14 BY DANTS Performed at [facility]    Report Status 12/02/2014 FINAL  Final   Organism ID, Bacteria METHICILLIN RESISTANT STAPHYLOCOCCUS AUREUS  Final       Susceptibility   Methicillin resistant staphylococcus aureus - MIC*    CLINDAMYCIN >=8 RESISTANT Resistant     ERYTHROMYCIN >=8 RESISTANT Resistant     GENTAMICIN <=0.5 SENSITIVE Sensitive     LEVOFLOXACIN >=8 RESISTANT Resistant     OXACILLIN >=4 RESISTANT Resistant     PENICILLIN >=0.5 RESISTANT Resistant     RIFAMPIN <=0.5 SENSITIVE Sensitive     TRIMETH/SULFA <=10 SENSITIVE Sensitive     VANCOMYCIN 1 SENSITIVE Sensitive     TETRACYCLINE 2 SENSITIVE Sensitive     * METHICILLIN RESISTANT STAPHYLOCOCCUS AUREUS  MRSA PCR Screening     Status: Abnormal   Collection Time: 11/28/2014  5:50 AM  Result Value Ref Range Status   MRSA by PCR POSITIVE (A) NEGATIVE Final    Comment:  The GeneXpert MRSA Assay (FDA approved for NASAL specimens only), is one component of a comprehensive MRSA colonization surveillance program. It is not intended to diagnose MRSA infection nor to guide or monitor treatment for MRSA infections. RESULT CALLED TO, READ BACK BY AND VERIFIED WITH: H. HOLDERNESS RN AT 0710 ON 01.13.16 BY SHUEA   Clostridium Difficile by PCR     Status: Abnormal   Collection Time: 12/06/2014  5:53 PM  Result Value Ref Range Status   C difficile by pcr POSITIVE (A) NEGATIVE Final    Comment: CRITICAL RESULT CALLED TO, READ BACK BY AND VERIFIED WITH: SISON RN 11:10 11/30/14 ([email or web address]) Performed at [facility]   Culture, blood (routine x 2)     Status: None (Preliminary result)   Collection Time: 11/30/14 10:40 PM  Result Value Ref Range Status   Specimen Description BLOOD RIGHT ARM  Final   Special Requests BOTTLES DRAWN AEROBIC ONLY 7ML  Final   Culture   Final           BLOOD CULTURE RECEIVED NO GROWTH TO DATE CULTURE WILL BE HELD FOR 5 DAYS BEFORE ISSUING A FINAL NEGATIVE REPORT Performed at [facility]    Report Status PENDING  Incomplete  Culture, blood (routine x 2)     Status: None (Preliminary result)   Collection Time: 11/30/14 10:40 PM  Result  Value Ref Range Status   Specimen Description BLOOD RIGHT FOREARM  Final   Special Requests BOTTLES DRAWN AEROBIC ONLY 4ML  Final   Culture   Final           BLOOD CULTURE RECEIVED NO GROWTH TO DATE CULTURE WILL BE HELD FOR 5 DAYS BEFORE ISSUING A FINAL NEGATIVE REPORT Performed at [facility]    Report Status PENDING  Incomplete       Imaging Studies:  No results found.   FINAL for CHANZ, CAHALL ([id number]) Patient: ORVELL, CAREAGA Collected: 12/01/2014 Client: [facility] Accession: [id number] Received: 12/01/2014 Tsosie Billing, PA-C DOB: March 14, 1951 Age: 64 Gender: M Reported: 12/04/2014 [address] CYTOPATHOLOGY REPORT Adequacy Reason Satisfactory For Evaluation. Diagnosis PLEURAL FLUID, RIGHT (SPECIMEN 1 OF 1 COLLECTED 12/01/14): METASTATIC ADENOCARCINOMA. Claudette Laws MD   Assessment/Plan: 64 y.o.   Metastatic Adenocarcinoma of the lung Biopsy proven, mutation wild type Case was complicated with large left pleural effusion requiring Ultrasound guided thoracentesis, yielding 1.3 l yellow colored fluid Pathology, case number [id number] is positive for metastatic adenocarcinoma No further chemo treatments unless patient shows dramatic clinical improvement. Continue thoracentesis as needed. If frequent, consider Pleurx cath for comfort I will defer to his primary oncologist to decide whether the patient is a candidate for further chemotherapy in the future. In the meantime, the patient wants supportive care only.  Anemia Due to malnutrition, dilution, malignancy, renal failure infection No blood loss reported. Hb today is 9.5 No transfusion is indicated at this time Monitor counts closely  Thrombocytopenia In the setting of malignancy, dilution, infection, antibiotics Platelets range from 90s-100s, today at 95,000 No transfusion is indicated at this time Can transfuse if platelets fall to less than 10,000, or less than 20k if  bleeding issues occur.  Monitor counts closely  Leukocytosis This is due to Decadron, infection, inflammation Steroids were discontinued on 1/20 No intervention is indicated at this time Will continue to monitor  Chronic lower extremity DVT Xarelto initiated on 1/11 Due to worsening renal functions, this was switched to heparin per pharmacy. GFR os less  than 30, continue heparin. INR 1.17 Appreciate Pharmacy involvement to advise on transition to coumadin  MRSA bacteremia Per blood cultures on 1/11 Due to infected Port-A Cath, now removed PICC line removed on 1/18, replaced with another PICC line On Vancomycin for 4 weeks as directed by Infectious Diseases, today day 12 of 28 Appreciate ID, primary team involvement  Renal Failure Due to poor cardiac function Appreciate Internal Medicine involvement  Acute systolic CHF 2 D echo showed trace pericardial effusion, EF 45% Placed on Lasix, now on hold due to poor urine output He is on fluid restriction. Steroids discontinued.  Appreciate primary team involvement  C difficile per PCR 1/13 Ongoing diarrhea He had a rectal tube placed He is treated with Vancomycin, day 12 of 28 Diarrhea is improving  Severe malnutrition On Ensure tid Appreciate nutritionist follow up  Full Code  Other medical issues including right great toe ulcer, Diabetes as per admitting team   His primary oncologist will return to the office next week on 12/11/2014. Please call me if questions arise. Consider palliative care consult if patient is not improving  **Disclaimer: This note was dictated with voice recognition software. Similar sounding words can inadvertently be transcribed and this note may contain transcription errors which may not have been corrected upon publication of note.Sharene Butters E, PA-C 12/06/2014  1:34 PM Javoni Lucken, MD 12/06/2014

## 2014-12-06 NOTE — Progress Notes (Addendum)
Inpatient Diabetes Program Recommendations  AACE/ADA: New Consensus Statement on Inpatient Glycemic Control (2013)  Target Ranges:  Prepandial:   less than 140 mg/dL      Peak postprandial:   less than 180 mg/dL (1-2 hours)      Critically ill patients:  140 - 180 mg/dL     Results for Ralph [name], Ralph [name] (MRN [id number]) as of 12/06/2014 09:51  Ref. Range 12/05/2014 07:41 12/05/2014 11:17 12/05/2014 16:54 12/05/2014 21:13  Glucose-Capillary Latest Range: 70-99 mg/dL 217 (H) 210 (H) 218 (H) 235 (H)    Results for Ralph [name], Ralph [name] (MRN [id number]) as of 12/06/2014 09:51  Ref. Range 12/06/2014 07:36  Glucose-Capillary Latest Range: 70-99 mg/dL 212 (H)     Home DM Meds: Lantus 10 units QHS       Novolog 4 units Qlunch   Current Orders: Novolog Sensitive SSI tid ac + HS    **Note Decadron stopped.  Last dose Decadron given yesterday at 8am.  **Glucose level still elevated this AM.  Note patient does take Lantus at home.    MD- Please consider adding 70% of patient's home dose of Lantus- Lantus 7 units QHS     Will follow Wyn Quaker RN, MSN, CDE Diabetes Coordinator Inpatient Diabetes Program Team Pager: [phone] (8a-10p)

## 2014-12-06 NOTE — Progress Notes (Signed)
ANTIBIOTIC CONSULT NOTE - FOLLOW UP  Pharmacy Consult for Vancomycin Indication: MRSA bacteremia, diabetic foot infection  No Known Allergies  Patient Measurements: Height: 6\' 2"  (188 cm) Weight: 207 lb 0.2 oz (93.9 kg) IBW/kg (Calculated) : 82.2  Vital Signs: Temp: 97.5 F (36.4 C) (01/20 0601) Temp Source: Oral (01/20 0601) BP: 126/72 mmHg (01/20 0601) Pulse Rate: 90 (01/20 0601) Intake/Output from previous day: 01/19 0701 - 01/20 0700 In: 838.5 [P.O.:150; I.V.:688.5] Out: 500 [Urine:500] Intake/Output from this shift:    Labs:  Recent Labs  12/04/14 0435 12/05/14 0500 12/06/14 0505  WBC 15.1* 15.7*  --   HGB 8.7* 8.9*  --   PLT 112* 108*  --   CREATININE 2.85* 3.03* 2.95*   Estimated Creatinine Clearance: 30.2 mL/min (by C-G formula based on Cr of 2.95).  Recent Labs  12/04/14 0435 12/06/14 0505  VANCORANDOM 21.0 14.6     Microbiology: Recent Results (from the past 720 hour(s))  Culture, blood (routine x 2)     Status: None   Collection Time: 12/16/2014  7:00 PM  Result Value Ref Range Status   Specimen Description BLOOD LEFT ARM  Final   Special Requests BOTTLES DRAWN AEROBIC AND ANAEROBIC 10CC  Final   Culture   Final    STAPHYLOCOCCUS AUREUS Note: SUSCEPTIBILITIES PERFORMED ON PREVIOUS CULTURE WITHIN THE LAST 5 DAYS. Note: Gram Stain Report Called to,Read Back By and Verified With: [facility] ADAMSON 12/02/14 430AM [place] Performed at [facility]    Report Status 12/03/2014 FINAL  Final  Culture, blood (routine x 2)     Status: None   Collection Time: 11/19/2014  7:04 PM  Result Value Ref Range Status   Specimen Description BLOOD RIGHT HAND  Final   Special Requests BOTTLES DRAWN AEROBIC AND ANAEROBIC  10CC  Final   Culture   Final    METHICILLIN RESISTANT STAPHYLOCOCCUS AUREUS Note: RIFAMPIN AND GENTAMICIN SHOULD NOT BE USED AS SINGLE DRUGS FOR TREATMENT OF STAPH INFECTIONS. CRITICAL RESULT CALLED TO, READ BACK BY AND VERIFIED WITH: Laurance Flatten 12/01/14 1245 BY SMITHERSJ Note: Culture results may be compromised due to an inadequate volume of blood received in culture bottles. Gram Stain Report Called to,Read Back By and Verified With: GRACE F@9 :57AM ON 11/30/14 BY DANTS Performed at [facility]    Report Status 12/02/2014 FINAL  Final   Organism ID, Bacteria METHICILLIN RESISTANT STAPHYLOCOCCUS AUREUS  Final      Susceptibility   Methicillin resistant staphylococcus aureus - MIC*    CLINDAMYCIN >=8 RESISTANT Resistant     ERYTHROMYCIN >=8 RESISTANT Resistant     GENTAMICIN <=0.5 SENSITIVE Sensitive     LEVOFLOXACIN >=8 RESISTANT Resistant     OXACILLIN >=4 RESISTANT Resistant     PENICILLIN >=0.5 RESISTANT Resistant     RIFAMPIN <=0.5 SENSITIVE Sensitive     TRIMETH/SULFA <=10 SENSITIVE Sensitive     VANCOMYCIN 1 SENSITIVE Sensitive     TETRACYCLINE 2 SENSITIVE Sensitive     * METHICILLIN RESISTANT STAPHYLOCOCCUS AUREUS  MRSA PCR Screening     Status: Abnormal   Collection Time: 11/21/2014  5:50 AM  Result Value Ref Range Status   MRSA by PCR POSITIVE (A) NEGATIVE Final    Comment:        The GeneXpert MRSA Assay (FDA approved for NASAL specimens only), is one component of a comprehensive MRSA colonization surveillance program. It is not intended to diagnose MRSA infection nor to guide or monitor treatment for MRSA infections. RESULT CALLED  TO, READ BACK BY AND VERIFIED WITH: H. HOLDERNESS RN AT 0710 ON 01.13.16 BY SHUEA   Clostridium Difficile by PCR     Status: Abnormal   Collection Time: 12/13/2014  5:53 PM  Result Value Ref Range Status   C difficile by pcr POSITIVE (A) NEGATIVE Final    Comment: CRITICAL RESULT CALLED TO, READ BACK BY AND VERIFIED WITH: SISON RN 11:10 11/30/14 ([email or web address]) Performed at [facility]   Culture, blood (routine x 2)     Status: None (Preliminary result)   Collection Time: 11/30/14 10:40 PM  Result Value Ref Range Status   Specimen Description BLOOD RIGHT ARM   Final   Special Requests BOTTLES DRAWN AEROBIC ONLY 7ML  Final   Culture   Final           BLOOD CULTURE RECEIVED NO GROWTH TO DATE CULTURE WILL BE HELD FOR 5 DAYS BEFORE ISSUING A FINAL NEGATIVE REPORT Performed at [facility]    Report Status PENDING  Incomplete  Culture, blood (routine x 2)     Status: None (Preliminary result)   Collection Time: 11/30/14 10:40 PM  Result Value Ref Range Status   Specimen Description BLOOD RIGHT FOREARM  Final   Special Requests BOTTLES DRAWN AEROBIC ONLY 4ML  Final   Culture   Final           BLOOD CULTURE RECEIVED NO GROWTH TO DATE CULTURE WILL BE HELD FOR 5 DAYS BEFORE ISSUING A FINAL NEGATIVE REPORT Performed at [facility]    Report Status PENDING  Incomplete    Assessment: 14 yoM admitted 12/15/2014 with progressive failure to thrive, LE edema, SOB. PMH includes end stage CHF (EF 25%), metastatic lung cancer, DM, anxiety, depression, DVT (recently on Xarelto), and worsening renal fxn. Empiric broad spectrum antibiotics are started with concern for bacteremia, diabetic foot infection, and/or PAC/PICC line infections. Pharmacy is consulted to continue dosing vancomycin. Port-cath/Picc line removed and another PICC line inserted 11/26/2014. New PICC was removed 1/18 as cultures were not no growth when inserted.   [facility] MAG Records of PTA antibiotics: Fluconazole 100mg  daily (per MAG, no stop date, prophylactic) Completed 10 day course of Levaquin 750mg  daily x10 days on 1/8 Vancomycin 1500mg  IV q24h started 1/9 with last dose 1/11 at 0400  (PTA 1/9 >>) 1/11 >> Vanc >> 1/11 >> Cefepime >> 1/13 (PTA) 1/11 >> Fluconazole >> 1/11 no doses inpt 1/14 >> metronidazole >> 1/14 1/14 >> PO Vancomycin >>  Tmax: AF WBCs: elevated but improving Renal: SCr = 2.95 (stable/slightly improved), normalized CrCl 49ml/min  Outside hospital records 1/8 blood x 2: MRSA 2/2 (vanco MIC = 1) 1/8 urine: MRSA 1/8 Port:  MRSA  Current admission 1/11 blood x2: MRSA 1/13 MRSA PCR: positive 1/13 Cdiff: Positive 1/14 blood x2: ngtd  Drug level / dose changes info: 1/12 0300 Vanc level = 29.6 mcg/mg before 4th dose on 1500mg  q24h per outpt records. Last dose 1500mg  1/11 at 0400 1/13 0500 random vanco = 21.4 - restart 1g q24h 1/16 1100 VT = 30.3 on vanc 1g q24 (prior to 4th dose) - HOLD 1/18 0435 random vanco = 21 (a little < 72hr since previous dose), est half-life = 78h  1/20 0500 random vanco =  14.6 mcg/ml, est half-life = 91h, start vancomycin 1250mg  IV q72h   Goal of Therapy:  Vancomycin trough level 15-20 mcg/ml  Plan:  Day #7 Vancomycin (from first blood culture with no growth), total vancomcyin Day #  12  Start  vancomycin 1250mg  IV q72h  Recheck random level as indicated (consider check 1/22 am to evaluate clearance)  Doreene Eland, PharmD, BCPS.   Pager: [phone] 12/06/2014,8:02 AM

## 2014-12-06 NOTE — Progress Notes (Signed)
[facility] PROGRESS NOTE  Ralph [name] PZW:[id number] DOB: 1951-08-27 DOA: 11/28/2014 PCP: No PCP Per Patient  Summary/Subjective: Appreciate Gen surgery/ID/Oncology/IR. Ralph [name] is a pleasant 64 y.o. male with a complex medical history including diabetes mellitus type 2, anxiety and depression, systolic heart failure with an EF of 25% back in October 2015,metastatic lung cancer on chemotherapy(now discontinued), who came in for generalized weakness and was found to have MRSA bacteremia(BC on 11/24/13). There was concern for port-cath/picc line infection, hence removal of both done. Blood cultures sent on 11/27/2013 still growing MRSA. Patient also had stool for C. difficile PCR sent on 11/29/13 and it is positive. His hemoglobin dropped to 7.7 g/dL prompting PRBC transfusion per Oncology(improved to 9.3 g/dL). He is on anticoagulation with IV heparin. Patient is on IV(to complete 3 weeks) and oral vancomycin per ID. He had 2-D echocardiogram which showed EF 45%, trace pericardial effusion and large pleural effusion and had ultrasound-guided drainage(1.3L of yellow colored fluid drained and labs pending). 2-D echo showed No obvious vegetations. His white count is slightly increased, likely from steroids, which will be discontinued today. He has rectal tube in place and has less diarrhea. Patient has very complex history and he has long ways to go, especially given the metastatic lung CA. Overall prognosis is poor. I discussed this concern with his son Ralph [name] over the phone on 12/03/14, and son seems to understand the gravity of the patient's condition. Will continue antibiotic management per ID(patient needs 17 more days of IV vancomycin). Patient had PICC line removed on 12/04/2014 patient ID recommendation with plans to replace the PICC line at least after 3 days of blood cultures showing no growth.  Plan MRSA bacteremia/Right great toe ulcer/ Diabetic foot ulcer associated with type 2  diabetes mellitus/C. difficile colitis/large pleural effusion  On Vancomycin per pharmacy, day 7. Need 7 more days.   Defer antibiotic management to ID.   Port-cath/Picc line removed and another PICC line inserted and removed on 12/04/14.   Reviewed pleural fluid analysis.  Repeat blood cultures on 11/27/2013 positive for MRSA.  Blood culture 1-14 no growth to date.   Will discussed with ID, regarding placement for PICC line.   On IV vancomycin day 12, need 16 moore days.   Still with rectal tube in place.   Acute systolic congestive heart failure/?Cardiorenal syndrome/SOB: - hold Lasix.  - Poor UOP. Restrict fluid, low sodium diet. Strict I and O's. - monitor electrolytes. - Discontinued steroids .  DVT, lower extremity, proximal, chronic/Swollen right arm. - Iv heparin per pharmacy. Appreciate help.  - GFR < 30 hence Xarelto not ideal, use heparin -Dr Alvy Bimler will see patient today, she is covering for [name].  Await oncologist recommendation regarding transition to coumadin, vs Lovenox.   DM (diabetes mellitus), type 1 with peripheral vascular complications - Sugars stable. - Cont SSI.  Acute renal failure syndrome -cr baseline around: 1.8 to 2.6 - slight bump in BUN/creatinine.  - Gentle fluids, hold lasix, still with diarrhea.   Metastatic lung carcinoma/anemia/thrombocytopenia, Malignant Pleural effusion:  - Defer to Oncology - Monitor hemoglobin -S/P thoracentesis. Pleural fluid consistent with adenocarcinoma. Oncologist will follow up on patient today.   Protein-calorie malnutrition, severe - Ensure TID   Code Status: Full code Family Communication: none at bedside, care discussed with patient.  Disposition Plan: Patient says he wants to go home but his son Ralph [name] says that he should go back to their facility where he has been since October last  year.  Consultants:  ID/Surgery/Oncology/I is a hospital and light during the normal  R  Procedures: Right foot MRI-Severe diffuse cellulitis and myofasciitis but no definite soft tissue abscess, septic arthritis or osteomyelitis  Antibiotics: Vancomycin 11/26/13>  Oral Vancomycin 11/30/13>    HPI/Subjective: Alert, denies abdominal pain, still with diarrhea. Wants to wear his pants.  Denies dyspnea.   Objective: Filed Vitals:   12/06/14 0601  BP: 126/72  Pulse: 90  Temp: 97.5 F (36.4 C)  Resp: 18    Intake/Output Summary (Last 24 hours) at 12/06/14 0930 Last data filed at 12/06/14 0093  Gross per 24 hour  Intake 838.54 ml  Output    500 ml  Net 338.54 ml   Filed Weights   12/04/14 0523 12/05/14 0500 12/06/14 0601  Weight: 92.6 kg (204 lb 2.3 oz) 92.8 kg (204 lb 9.4 oz) 93.9 kg (207 lb 0.2 oz)    Exam:   General: Frail  Cardiovascular: RRR. No Murmurs. S1S2 normal.  Respiratory: No wheezing. CTA  Abdomen: Soft and nontender  Musculoskeletal: No drainage right big toe. Edema right leg.   Data Reviewed: Basic Metabolic Panel:  Recent Labs Lab 12/02/14 0430 12/03/14 0355 12/04/14 0435 12/05/14 0500 12/06/14 0505  NA 135 135 139 138 138  K 4.1 4.0 4.5 4.5 4.2  CL 99 104 104 104 102  CO2 26 27 25 24 24   GLUCOSE 90 88 121* 219* 218*  BUN 64* 63* 63* 70* 74*  CREATININE 2.56* 2.73* 2.85* 3.03* 2.95*  CALCIUM 7.2* 7.2* 7.3* 7.4* 7.3*   Liver Function Tests:  Recent Labs Lab 12/02/14 0430 12/03/14 0355 12/04/14 0435 12/05/14 0500 12/06/14 0505  AST 43* 44* 53* 48* 58*  ALT 17 18 19 21 25   ALKPHOS 75 74 75 88 87  BILITOT 0.8 0.9 0.8 0.7 0.6  PROT 4.6* 4.7* 4.6* 4.9* 4.8*  ALBUMIN 1.2* 1.2* 1.1* 1.2* 1.2*   No results for input(s): LIPASE, AMYLASE in the last 168 hours. No results for input(s): AMMONIA in the last 168 hours. CBC:  Recent Labs Lab 12/02/14 0430 12/03/14 0355 12/04/14 0435 12/05/14 0500 12/06/14 0500  WBC 13.2* 12.9* 15.1* 15.7* 12.9*  HGB 8.9* 8.8* 8.7* 8.9* 9.2*  HCT 27.1* 27.2* 27.0* 26.9* 29.2*   MCV 89.7 90.7 90.0 89.1 89.0  PLT 86* 105* 112* 108* 95*   Cardiac Enzymes: No results for input(s): CKTOTAL, CKMB, CKMBINDEX, TROPONINI in the last 168 hours. BNP (last 3 results)  Recent Labs  09/12/14 0620 11/23/14 1619  PROBNP 8587.0* 409.0*   CBG:  Recent Labs Lab 12/05/14 0741 12/05/14 1117 12/05/14 1654 12/05/14 2113 12/06/14 0736  GLUCAP 217* 210* 218* 235* 212*    Recent Results (from the past 240 hour(s))  Culture, blood (routine x 2)     Status: None   Collection Time: 11/25/2014  7:00 PM  Result Value Ref Range Status   Specimen Description BLOOD LEFT ARM  Final   Special Requests BOTTLES DRAWN AEROBIC AND ANAEROBIC 10CC  Final   Culture   Final    STAPHYLOCOCCUS AUREUS Note: SUSCEPTIBILITIES PERFORMED ON PREVIOUS CULTURE WITHIN THE LAST 5 DAYS. Note: Gram Stain Report Called to,Read Back By and Verified With: [facility] ADAMSON 12/02/14 430AM [place] Performed at [facility]    Report Status 12/03/2014 FINAL  Final  Culture, blood (routine x 2)     Status: None   Collection Time: 11/30/2014  7:04 PM  Result Value Ref Range Status   Specimen Description BLOOD RIGHT HAND  Final   Special Requests BOTTLES DRAWN AEROBIC AND ANAEROBIC  10CC  Final   Culture   Final    METHICILLIN RESISTANT STAPHYLOCOCCUS AUREUS Note: RIFAMPIN AND GENTAMICIN SHOULD NOT BE USED AS SINGLE DRUGS FOR TREATMENT OF STAPH INFECTIONS. CRITICAL RESULT CALLED TO, READ BACK BY AND VERIFIED WITH: Laurance Flatten 12/01/14 1245 BY SMITHERSJ Note: Culture results may be compromised due to an inadequate volume of blood received in culture bottles. Gram Stain Report Called to,Read Back By and Verified With: GRACE F@9 :57AM ON 11/30/14 BY DANTS Performed at [facility]    Report Status 12/02/2014 FINAL  Final   Organism ID, Bacteria METHICILLIN RESISTANT STAPHYLOCOCCUS AUREUS  Final      Susceptibility   Methicillin resistant staphylococcus aureus - MIC*    CLINDAMYCIN >=8 RESISTANT  Resistant     ERYTHROMYCIN >=8 RESISTANT Resistant     GENTAMICIN <=0.5 SENSITIVE Sensitive     LEVOFLOXACIN >=8 RESISTANT Resistant     OXACILLIN >=4 RESISTANT Resistant     PENICILLIN >=0.5 RESISTANT Resistant     RIFAMPIN <=0.5 SENSITIVE Sensitive     TRIMETH/SULFA <=10 SENSITIVE Sensitive     VANCOMYCIN 1 SENSITIVE Sensitive     TETRACYCLINE 2 SENSITIVE Sensitive     * METHICILLIN RESISTANT STAPHYLOCOCCUS AUREUS  MRSA PCR Screening     Status: Abnormal   Collection Time: 12/05/2014  5:50 AM  Result Value Ref Range Status   MRSA by PCR POSITIVE (A) NEGATIVE Final    Comment:        The GeneXpert MRSA Assay (FDA approved for NASAL specimens only), is one component of a comprehensive MRSA colonization surveillance program. It is not intended to diagnose MRSA infection nor to guide or monitor treatment for MRSA infections. RESULT CALLED TO, READ BACK BY AND VERIFIED WITH: H. HOLDERNESS RN AT 0710 ON 01.13.16 BY SHUEA   Clostridium Difficile by PCR     Status: Abnormal   Collection Time: 12/02/2014  5:53 PM  Result Value Ref Range Status   C difficile by pcr POSITIVE (A) NEGATIVE Final    Comment: CRITICAL RESULT CALLED TO, READ BACK BY AND VERIFIED WITH: SISON RN 11:10 11/30/14 ([email or web address]) Performed at [facility]   Culture, blood (routine x 2)     Status: None (Preliminary result)   Collection Time: 11/30/14 10:40 PM  Result Value Ref Range Status   Specimen Description BLOOD RIGHT ARM  Final   Special Requests BOTTLES DRAWN AEROBIC ONLY 7ML  Final   Culture   Final           BLOOD CULTURE RECEIVED NO GROWTH TO DATE CULTURE WILL BE HELD FOR 5 DAYS BEFORE ISSUING A FINAL NEGATIVE REPORT Performed at [facility]    Report Status PENDING  Incomplete  Culture, blood (routine x 2)     Status: None (Preliminary result)   Collection Time: 11/30/14 10:40 PM  Result Value Ref Range Status   Specimen Description BLOOD RIGHT FOREARM  Final   Special Requests BOTTLES  DRAWN AEROBIC ONLY 4ML  Final   Culture   Final           BLOOD CULTURE RECEIVED NO GROWTH TO DATE CULTURE WILL BE HELD FOR 5 DAYS BEFORE ISSUING A FINAL NEGATIVE REPORT Performed at [facility]    Report Status PENDING  Incomplete     Studies: No results found.  Scheduled Meds: . albuterol  2.5 mg Nebulization TID  . calcium carbonate  1,250 mg Oral TID  .  carvedilol  9.375 mg Oral BID WC  . dextromethorphan-guaiFENesin  2 tablet Oral BID  . escitalopram  10 mg Oral Daily  . feeding supplement (ENSURE COMPLETE)  237 mL Oral BID BM  . insulin aspart  0-5 Units Subcutaneous QHS  . insulin aspart  0-9 Units Subcutaneous TID WC  . isosorbide-hydrALAZINE  1 tablet Oral BID  . nicotine  7 mg Transdermal Daily  . pantoprazole  40 mg Oral Daily  . sodium chloride  3 mL Intravenous Q12H  . vancomycin  1,250 mg Intravenous Once  . [START ON 12/09/2014] vancomycin  1,250 mg Intravenous Q72H  . vancomycin  125 mg Oral 4 times per day   Continuous Infusions: . dextrose 5 % and 0.45% NaCl 30 mL/hr at 12/06/14 0607  . heparin 1,750 Units/hr (12/05/14 2339)    Principal Problem:   MRSA bacteremia Active Problems:   Essential hypertension   Protein-calorie malnutrition, severe   Metastatic lung carcinoma   CRI (chronic renal insufficiency)   Diabetic foot ulcer associated with type 2 diabetes mellitus   DVT, lower extremity, proximal, chronic   DM (diabetes mellitus), type 1 with peripheral vascular complications   Chronic systolic heart failure   COPD (chronic obstructive pulmonary disease)   Cigarette smoker   Normocytic anemia   Thrombocytopenia   Depression   Anxiety   Infection of venous access Port-a-catheter s/p removal 11/29/2013   Enteritis due to Clostridium difficile   Central line infection    Time spent: 25 minutes    [place], [place] [facility] Pager [phone]. If 7PM-7AM, please contact night-coverage at [email or web address], password  [facility] 12/06/2014, 9:30 AM  LOS: 9 days

## 2014-12-06 NOTE — Progress Notes (Signed)
Patient ID: Ralph [name], male   DOB: 04-13-1951, 64 y.o.   MRN: [id number]         [facility]    Date of Admission:  12/08/2014           Day 12 IV vancomycin        Day 7 oral vancomycin  Principal Problem:   MRSA bacteremia Active Problems:   Essential hypertension   Protein-calorie malnutrition, severe   Metastatic lung carcinoma   CRI (chronic renal insufficiency)   Diabetic foot ulcer associated with type 2 diabetes mellitus   DVT, lower extremity, proximal, chronic   DM (diabetes mellitus), type 1 with peripheral vascular complications   Chronic systolic heart failure   COPD (chronic obstructive pulmonary disease)   Cigarette smoker   Normocytic anemia   Thrombocytopenia   Depression   Anxiety   Infection of venous access Port-a-catheter s/p removal 11/29/2013   Enteritis due to Clostridium difficile   Central line infection   . albuterol  2.5 mg Nebulization TID  . calcium carbonate  1,250 mg Oral TID  . carvedilol  9.375 mg Oral BID WC  . dextromethorphan-guaiFENesin  2 tablet Oral BID  . escitalopram  10 mg Oral Daily  . feeding supplement (ENSURE COMPLETE)  237 mL Oral BID BM  . insulin aspart  0-5 Units Subcutaneous QHS  . insulin aspart  0-9 Units Subcutaneous TID WC  . isosorbide-hydrALAZINE  1 tablet Oral BID  . nicotine  7 mg Transdermal Daily  . pantoprazole  40 mg Oral Daily  . sodium chloride  3 mL Intravenous Q12H  . [START ON 12/09/2014] vancomycin  1,250 mg Intravenous Q72H  . vancomycin  125 mg Oral 4 times per day    Subjective: He states that he is doing better. He also tells me that he will be leaving tonight to go home and visit his family.   Past Medical History  Diagnosis Date  . Allergy   . Anxiety     followed [facility] every six months.  . Hypertension   . Diabetes mellitus without complication   . Metastatic lung carcinoma 09/16/2014    History  Substance Use Topics  . Smoking status:  Current Some Day Smoker -- 0.75 packs/day for 36 years    Types: Cigarettes    Start date: 03/01/1979  . Smokeless tobacco: Never Used  . Alcohol Use: No    Family History  Problem Relation Age of Onset  . Heart disease Mother     AMI  . Cancer Sister     breast  . Heart disease Brother     AMI  . Cancer Brother   . Heart attack Mother   . Heart attack Brother   . Stroke Neg Hx    No Known Allergies  OBJECTIVE: Blood pressure 118/63, pulse 80, temperature 97.7 F (36.5 C), temperature source Oral, resp. rate 18, height 6\' 2"  (1.88 m), weight 207 lb 0.2 oz (93.9 kg), SpO2 98 %. General: He is alert and in no distress watching television Skin: Former Port-A-Cath site with clean dry gauze dressing Lungs: Clear Cor: Regular S1 and S2 with no murmurs Abdomen: Soft and nontender. Diarrhea persists   Lab Results Lab Results  Component Value Date   WBC 12.9* 12/06/2014   HGB 9.2* 12/06/2014   HCT 29.2* 12/06/2014   MCV 89.0 12/06/2014   PLT 95* 12/06/2014    Lab Results  Component Value Date   CREATININE 2.95* 12/06/2014  BUN 74* 12/06/2014   NA 138 12/06/2014   K 4.2 12/06/2014   CL 102 12/06/2014   CO2 24 12/06/2014    Lab Results  Component Value Date   ALT 25 12/06/2014   AST 58* 12/06/2014   ALKPHOS 87 12/06/2014   BILITOT 0.6 12/06/2014     Microbiology: Recent Results (from the past 240 hour(s))  Culture, blood (routine x 2)     Status: None   Collection Time: 11/30/2014  7:00 PM  Result Value Ref Range Status   Specimen Description BLOOD LEFT ARM  Final   Special Requests BOTTLES DRAWN AEROBIC AND ANAEROBIC 10CC  Final   Culture   Final    STAPHYLOCOCCUS AUREUS Note: SUSCEPTIBILITIES PERFORMED ON PREVIOUS CULTURE WITHIN THE LAST 5 DAYS. Note: Gram Stain Report Called to,Read Back By and Verified With: [facility] ADAMSON 12/02/14 430AM [place] Performed at [facility]    Report Status 12/03/2014 FINAL  Final  Culture, blood (routine x 2)      Status: None   Collection Time: 11/28/2014  7:04 PM  Result Value Ref Range Status   Specimen Description BLOOD RIGHT HAND  Final   Special Requests BOTTLES DRAWN AEROBIC AND ANAEROBIC  10CC  Final   Culture   Final    METHICILLIN RESISTANT STAPHYLOCOCCUS AUREUS Note: RIFAMPIN AND GENTAMICIN SHOULD NOT BE USED AS SINGLE DRUGS FOR TREATMENT OF STAPH INFECTIONS. CRITICAL RESULT CALLED TO, READ BACK BY AND VERIFIED WITH: Laurance Flatten 12/01/14 1245 BY SMITHERSJ Note: Culture results may be compromised due to an inadequate volume of blood received in culture bottles. Gram Stain Report Called to,Read Back By and Verified With: GRACE F@9 :57AM ON 11/30/14 BY DANTS Performed at [facility]    Report Status 12/02/2014 FINAL  Final   Organism ID, Bacteria METHICILLIN RESISTANT STAPHYLOCOCCUS AUREUS  Final      Susceptibility   Methicillin resistant staphylococcus aureus - MIC*    CLINDAMYCIN >=8 RESISTANT Resistant     ERYTHROMYCIN >=8 RESISTANT Resistant     GENTAMICIN <=0.5 SENSITIVE Sensitive     LEVOFLOXACIN >=8 RESISTANT Resistant     OXACILLIN >=4 RESISTANT Resistant     PENICILLIN >=0.5 RESISTANT Resistant     RIFAMPIN <=0.5 SENSITIVE Sensitive     TRIMETH/SULFA <=10 SENSITIVE Sensitive     VANCOMYCIN 1 SENSITIVE Sensitive     TETRACYCLINE 2 SENSITIVE Sensitive     * METHICILLIN RESISTANT STAPHYLOCOCCUS AUREUS  MRSA PCR Screening     Status: Abnormal   Collection Time: 11/22/2014  5:50 AM  Result Value Ref Range Status   MRSA by PCR POSITIVE (A) NEGATIVE Final    Comment:        The GeneXpert MRSA Assay (FDA approved for NASAL specimens only), is one component of a comprehensive MRSA colonization surveillance program. It is not intended to diagnose MRSA infection nor to guide or monitor treatment for MRSA infections. RESULT CALLED TO, READ BACK BY AND VERIFIED WITH: H. HOLDERNESS RN AT 0710 ON 01.13.16 BY SHUEA   Clostridium Difficile by PCR     Status: Abnormal    Collection Time: 11/20/2014  5:53 PM  Result Value Ref Range Status   C difficile by pcr POSITIVE (A) NEGATIVE Final    Comment: CRITICAL RESULT CALLED TO, READ BACK BY AND VERIFIED WITH: SISON RN 11:10 11/30/14 ([email or web address]) Performed at [facility]   Culture, blood (routine x 2)     Status: None (Preliminary result)   Collection Time: 11/30/14 10:40 PM  Result Value Ref Range Status   Specimen Description BLOOD RIGHT ARM  Final   Special Requests BOTTLES DRAWN AEROBIC ONLY 7ML  Final   Culture   Final           BLOOD CULTURE RECEIVED NO GROWTH TO DATE CULTURE WILL BE HELD FOR 5 DAYS BEFORE ISSUING A FINAL NEGATIVE REPORT Performed at [facility]    Report Status PENDING  Incomplete  Culture, blood (routine x 2)     Status: None (Preliminary result)   Collection Time: 11/30/14 10:40 PM  Result Value Ref Range Status   Specimen Description BLOOD RIGHT FOREARM  Final   Special Requests BOTTLES DRAWN AEROBIC ONLY 4ML  Final   Culture   Final           BLOOD CULTURE RECEIVED NO GROWTH TO DATE CULTURE WILL BE HELD FOR 5 DAYS BEFORE ISSUING A FINAL NEGATIVE REPORT Performed at [facility]    Report Status PENDING  Incomplete    Assessment: His MRSA bacteremia is improving. He can have a new PICC placed. I will continue oral vancomycin for his C. difficile colitis while he remains on IV vancomycin.  Plan: 1. Continue both IV and oral vancomycin for 16 more days  Michel Bickers, MD [facility] for [place] [phone] pager   [phone] cell 12/06/2014, 4:48 PM

## 2014-12-06 NOTE — Progress Notes (Signed)
ANTICOAGULATION CONSULT NOTE  Pharmacy Consult for Heparin Indication: VTE treatment  No Known Allergies  Patient Measurements: Height: 6\' 2"  (188 cm) Weight: 207 lb 0.2 oz (93.9 kg) IBW/kg (Calculated) : 82.2 Heparin Dosing Weight: actual weight  Vital Signs: Temp: 97.5 F (36.4 C) (01/20 0601) Temp Source: Oral (01/20 0601) BP: 126/72 mmHg (01/20 0601) Pulse Rate: 90 (01/20 0601)  Labs:  Recent Labs  12/04/14 0435 12/05/14 0500 12/06/14 0505  HGB 8.7* 8.9*  --   HCT 27.0* 26.9*  --   PLT 112* 108*  --   HEPARINUNFRC 0.39 0.48 0.44  CREATININE 2.85* 3.03* 2.95*    Estimated Creatinine Clearance: 30.2 mL/min (by C-G formula based on Cr of 2.95).  Medications:  Infusions:  . dextrose 5 % and 0.45% NaCl 30 mL/hr at 12/06/14 0607  . heparin 1,750 Units/hr (12/05/14 2339)    Assessment: 68 YOM admitted 1/11 with metastatic lung cancer and history of LLE DVT with possible upper extremity DVT. He has been on Xarelto at [facility] rehab, initially 15mg  BID started on 12/22 with plan to change to 20mg  once daily dosing on 1/12, but on 1/4, order was restarted as Xarelto 15 mg BID x21d with change to 20mg  daily on 1/26.  Pharmacy is now consulted to dose IV Heparin for VTE.    Significant events: 1/11 Xarelto continued 1/12 Last Xarelto dose given ~ 8am.  Plan to change from Xarelto to Heparin, but start held d/t recent Xarelto and worsening renal function. 1/13 Heparin initiation held for infected PAC removed.  PICC line inserted. 1/14 Pharmacy is re-consulted to dose IV Heparin  Today, 12/06/2014  Heparin level 0.44, therapeutic level on heparin at 1750 units/hr.   Hgb stable at 9.2, s/p transfusion 1/14.  Plts = 95 (trending back down).   No active bleeding reported today per RN; noted bloody and purulent dressing changes 1/15. RN to monitor and report any bleeding from [facility] removal site during dressing change (bleeding reported on 1/13)  SCr continues to rise to  3.03rCl ~ 43m/min  Goal of Therapy:  Heparin level 0.3-0.7 units/ml Monitor platelets by anticoagulation protocol: Yes   Plan:   Continue heparin IV infusion at 1750 units/hr.  Daily heparin level, CBC  Follow up transition back to Xarelto when/if renal function improves  Per mfg, his renal function has been hovering ~6ml/min. mfg recommends avoiding use of xarelto for DVT/PE tx when CrCl < 30.  Suggest consider transitioning to alternative anticoagulation (like warfarin, altenatively LMWH)  Doreene Eland, PharmD, BCPS.   Pager: [phone] 12/06/2014 7:52 AM

## 2014-12-06 NOTE — Progress Notes (Signed)
ANTICOAGULATION CONSULT NOTE - Initial Consult  Pharmacy Consult for Warfarin Indication: VTE treatment  No Known Allergies  Patient Measurements: Height: 6\' 2"  (188 cm) Weight: 207 lb 0.2 oz (93.9 kg) IBW/kg (Calculated) : 82.2  Vital Signs: Temp: 97.7 F (36.5 C) (01/20 1350) Temp Source: Oral (01/20 1350) BP: 118/63 mmHg (01/20 1350) Pulse Rate: 80 (01/20 1350)  Labs:  Recent Labs  12/04/14 0435 12/05/14 0500 12/06/14 0500 12/06/14 0505  HGB 8.7* 8.9* 9.2*  --   HCT 27.0* 26.9* 29.2*  --   PLT 112* 108* 95*  --   HEPARINUNFRC 0.39 0.48  --  0.44  CREATININE 2.85* 3.03*  --  2.95*    Estimated Creatinine Clearance: 30.2 mL/min (by C-G formula based on Cr of 2.95).   Medical History: Past Medical History  Diagnosis Date  . Allergy   . Anxiety     followed [facility] every six months.  . Hypertension   . Diabetes mellitus without complication   . Metastatic lung carcinoma 09/16/2014    Medications:  Scheduled:  . albuterol  2.5 mg Nebulization TID  . calcium carbonate  1,250 mg Oral TID  . carvedilol  9.375 mg Oral BID WC  . dextromethorphan-guaiFENesin  2 tablet Oral BID  . escitalopram  10 mg Oral Daily  . feeding supplement (ENSURE COMPLETE)  237 mL Oral BID BM  . insulin aspart  0-5 Units Subcutaneous QHS  . insulin aspart  0-9 Units Subcutaneous TID WC  . isosorbide-hydrALAZINE  1 tablet Oral BID  . nicotine  7 mg Transdermal Daily  . pantoprazole  40 mg Oral Daily  . sodium chloride  3 mL Intravenous Q12H  . [START ON 12/09/2014] vancomycin  1,250 mg Intravenous Q72H  . vancomycin  125 mg Oral 4 times per day   Infusions:  . heparin 1,750 Units/hr (12/06/14 1244)   PRN: acetaminophen, albuterol, clonazePAM, morphine injection, ondansetron **OR** ondansetron (ZOFRAN) IV, polyethylene glycol, promethazine, sodium chloride, sodium chloride, sodium chloride, traMADol  Assessment: Patient known to Pharmacy from heparin and antibiotic  protocols.  On chronic anticoagulation for history of LLE DVT with possible upper extremity DVT.  Had been placed on Xarelto which was recently placed on hold due to worsening renal function.  Since then has been bridged with IV heparin and is now being started on warfarin in place of Xarelto.  Goal of Therapy:  INR 2-3 Monitor platelets by anticoagulation protocol: Yes   Plan:   Warfarin 5mg  PO x 1 tonight  Daily PT/INR  Provide warfarin education prior to discharge  Peggyann Juba, PharmD, BCPS Pager: [phone] 12/06/2014,7:15 PM

## 2014-12-07 ENCOUNTER — Ambulatory Visit: Payer: Self-pay | Admitting: Physician Assistant

## 2014-12-07 DIAGNOSIS — N178 Other acute kidney failure: Secondary | ICD-10-CM

## 2014-12-07 DIAGNOSIS — I5022 Chronic systolic (congestive) heart failure: Secondary | ICD-10-CM

## 2014-12-07 LAB — BASIC METABOLIC PANEL
Anion gap: 10 (ref 5–15)
BUN: 84 mg/dL — ABNORMAL HIGH (ref 6–23)
CALCIUM: 7.2 mg/dL — AB (ref 8.4–10.5)
CO2: 22 mmol/L (ref 19–32)
CREATININE: 3.06 mg/dL — AB (ref 0.50–1.35)
Chloride: 102 mEq/L (ref 96–112)
GFR calc Af Amer: 24 mL/min — ABNORMAL LOW (ref >90)
GFR, EST NON AFRICAN AMERICAN: 20 mL/min — AB (ref >90)
Glucose, Bld: 116 mg/dL — ABNORMAL HIGH (ref 70–99)
Potassium: 4 mmol/L (ref 3.5–5.1)
Sodium: 134 mmol/L — ABNORMAL LOW (ref 135–145)

## 2014-12-07 LAB — CULTURE, BLOOD (ROUTINE X 2)
CULTURE: NO GROWTH
Culture: NO GROWTH

## 2014-12-07 LAB — CBC
HCT: 29 % — ABNORMAL LOW (ref 39.0–52.0)
Hemoglobin: 9.3 g/dL — ABNORMAL LOW (ref 13.0–17.0)
MCH: 28.7 pg (ref 26.0–34.0)
MCHC: 32.1 g/dL (ref 30.0–36.0)
MCV: 89.5 fL (ref 78.0–100.0)
PLATELETS: 120 10*3/uL — AB (ref 150–400)
RBC: 3.24 MIL/uL — AB (ref 4.22–5.81)
RDW: 20.1 % — ABNORMAL HIGH (ref 11.5–15.5)
WBC: 11.1 10*3/uL — ABNORMAL HIGH (ref 4.0–10.5)

## 2014-12-07 LAB — HEPARIN LEVEL (UNFRACTIONATED): HEPARIN UNFRACTIONATED: 0.36 [IU]/mL (ref 0.30–0.70)

## 2014-12-07 MED ORDER — MORPHINE SULFATE 2 MG/ML IJ SOLN: 1.0000 mg | INTRAMUSCULAR | Status: DC | PRN

## 2014-12-07 MED ORDER — MORPHINE SULFATE 4 MG/ML IJ SOLN
INTRAMUSCULAR | Status: AC
  Filled 2014-12-07: qty 1

## 2014-12-07 MED ORDER — MORPHINE SULFATE 4 MG/ML IJ SOLN
4.0000 mg | Freq: Once | INTRAMUSCULAR | Status: AC
  Administered 2014-12-07: 4 mg via INTRAVENOUS

## 2014-12-18 NOTE — Discharge Summary (Addendum)
Death Summary  Ralph [name] KGS:[id number] DOB: 10-Jan-1951 DOA: 11/30/2014  PCP: No PCP Per Patient PCP/Office notified: will send report to Dr Ralph [name].   Admit date: November 30, 2014 Date of Death: December 10, 2014  Final Diagnoses:    Metastatic lung carcinoma, stage IV.    PEA, cardiac arrest.    MRSA bacteremia   Enteritis due to Clostridium difficile   Essential hypertension   Protein-calorie malnutrition, severe   CRI (chronic renal insufficiency)   Diabetic foot ulcer associated with type 2 diabetes mellitus   DVT, lower extremity, proximal, chronic   DM (diabetes mellitus), type 1 with peripheral vascular complications   Chronic systolic heart failure   COPD (chronic obstructive pulmonary disease)   Cigarette smoker   Normocytic anemia   Thrombocytopenia   Depression   Anxiety   Infection of venous access Port-[name]-catheter s/p removal 11/29/2013   Central line infection     History of present illness:  Ralph [name] is [name] 64 y.o. male past medical history of diabetes mellitus type 2, anxiety and depression, systolic heart failure with an EF of 25% back in October 2015 was seen his cardiologist this month and increase his Lasix and his creatinine worsen, metastatic lung cancer on chemotherapy that comes in for generalized weakness. As per patient he relates he try to use the port about 2 weeks ago at the emergency room and it was not working and white substance came out. He denies any chest pain, some shortness of breath with exertion. He relates he can ambulate without being short of breath. His lower extremities have progressively gotten edematous.   Hospital Course:   Ralph [name] is [name] very  pleasant 64 y.o. male with [name] complex medical history including diabetes mellitus type 2, anxiety and depression, systolic heart failure with an EF of 25% back in October 2015,metastatic lung cancer on chemotherapy(now discontinued), who came in for generalized weakness and was found to have MRSA  bacteremia(BC on 11/24/13). There was concern for port-cath/picc line infection, hence removal of both done. Blood cultures sent on 11/30/13 still growing MRSA. Patient also had stool for C. difficile PCR sent on 11/29/13 and it is positive. His hemoglobin dropped to 7.7 g/dL prompting PRBC transfusion per Oncology(improved to 9.3 g/dL). He is on anticoagulation with IV heparin. Patient is on IV(to complete 3 weeks) and oral vancomycin per ID. He had 2-D echocardiogram which showed EF 45%, trace pericardial effusion and large pleural effusion and had ultrasound-guided drainage(1.3L of yellow colored fluid drained and labs pending). 2-D echo showed No obvious vegetations. His white count is slightly increased, likely from steroids, which will be discontinued. . He has rectal tube in place and has less diarrhea. Patient has very complex history and he has long ways to go, especially given the metastatic lung CA. Overall prognosis is poor. Dr Ralph [name] discussed this concern with his son Ralph [name] over the phone on 12/03/14, and son seems to understand the gravity of the patient's condition. Will continue antibiotic management per ID(patient needs 16 more days of IV vancomycin). Patient had PICC line removed on 12/04/2014 patient ID recommendation with plans to replace the PICC line at least after 3 days of blood cultures showing no growth.  Patient was notice to have increase work of breathing, and more lethargic. He received nebulizer treatment. His CBG was 116 on B-met. Patient HR decrease to 30, RN was at bedside inmidialtly, patient became unresponsive. Patient had PEA, hypotension.  Code Blue was called. CPR was started. He received  [name] dose of epinephrine. Dr Ralph [name] responded to Code blue. Dr Ralph [name] Discussed with POA over the phone, patient medical situation. Subsequently it was decide to focus on comfort. Patient received IV morphine to help with increase work of breathing. When I arrived patient was non responsive.  Subsequently patient pass at 8;35.  Comfort was provide to the family.   Plan MRSA bacteremia/Right great toe ulcer/ Diabetic foot ulcer associated with type 2 diabetes mellitus/C. difficile colitis/large pleural effusion  On oral Vancomycin per pharmacy, day 8.    Defer antibiotic management to ID.   Port-cath/Picc line removed and another PICC line inserted and removed on 12/04/14.   Reviewed pleural fluid analysis.  Repeat blood cultures on 11/27/2013 positive for MRSA.  Blood culture 1-14 no growth to date.   On IV vancomycin day 12.   rectal tube in place.  Acute systolic congestive heart failure/?Cardiorenal syndrome/SOB: - Poor UOP. Restrict fluid, low sodium diet. Strict I and O's. - monitor electrolytes. - Discontinued steroids .  DVT, lower extremity, proximal, chronic/Swollen right arm. - Iv heparin per pharmacy. Appreciate help.  - GFR < 30 hence Xarelto not ideal, use heparin -on heparin Gtt, coumadin started 1-20  DM (diabetes mellitus), type 1 with peripheral vascular complications - Sugars stable. - Cont SSI.  Acute renal failure syndrome -cr baseline around: 1.8 to 2.6 - slight bump in BUN/creatinine.  - Gentle fluids, hold lasix, still with diarrhea.   Metastatic lung carcinoma/anemia/thrombocytopenia, Malignant Pleural effusion:  - Defer to Oncology - Monitor hemoglobin -S/P thoracentesis. Pleural fluid consistent with adenocarcinoma. Oncologist following.   Protein-calorie malnutrition, severe - Was on Ensure TID  Dry gangrene Right great toe. Right LE cellulitis, diabetes ulcer. On IV vancomycin.    Code Status: Full code Family Communication: none at bedside, care discussed with patient.  Disposition Plan: Patient says he wants to go home but his son Ralph [name] says that he should go back to their facility where he has been since October last year.  Consultants:  ID/Surgery/Oncology/I is [name] hospital and light during the  normal R     Time: 8;35 AM  Signed:  Niel Hummer [name]  [facility] 12-25-2014, 7:45 AM

## 2014-12-18 NOTE — Progress Notes (Signed)
At 0702 during bedside reporting at change of shift, night RN and myself were alerted of patient's HR in the 30s. As I went into the patient's room he was not responding.  He took one large deep breath and lost pulse/respirations.  Patient was a full code and a code was initiated. CPR performed and AED pads in place.  Pt. showed PEA on the monitor. At 0705, a dose of epinephrine was given along with continued CPR. The patient's family was notified (his sister who is the healthcare power of attorney and his son.) MD was notified.  Emergency room MD spoke with patient's sister, Healthcare POA, who stated to her "CPR only, no meds, he wouldn't want to be on life support." At 2, patient had spontaneous breaths and pulse. His BP was 193/99, pulse ST at 110.  At 0714, BP was rechecked which was 77/47. MD continued to speak with Healthcare POA who stated to stop CPR. We remained with the patient until his passing at [phone] which was verified by myself and Marissa Long, CN. Patient's primary MD, Dr. Tyrell Antonio was present during the time of death.  Family alerted to patient's passing and were in route to the hospital.

## 2014-12-18 NOTE — Progress Notes (Signed)
This morning, patient's breathing became more labored. Respiratory was called to do a treatment. Also, patient has become less responsive. His eyes are open, but he would not respond to his primary RN and would only respond after multiple times of another RN trying to get him to speak to her Respirations are at 69, but breathing still looks labored. NP made aware of these changes and that patient is a full code. NP stated she would let the primary MD aware these changes. Will report this off to day RN.

## 2014-12-18 NOTE — Consult Note (Signed)
Responded to Code Blue.   Per nursing report pt took a deep breath and then became bradycardic and unresponsive.  Pt with PEA as initial rhythm.  CPR was initiated - please see code sheet for full details.  Discussed case with patient's sister and POA his critical illness.  His sister did not think that he would want life support, intubation or invasive procedures.  Pt did have ROSC but continued to have agonal respirations.  Upon discussion with sister patient was transitioned to comfort care.  He was placed on supplemental oxygen and given morphine for comfort.

## 2014-12-18 NOTE — Care Management Note (Addendum)
    Page 1 of 1   12-28-14     11:24:19 AM CARE MANAGEMENT NOTE 2014/12/28  Patient:  Ralph [name], Ralph [name]   Account Number:  [id number]  Date Initiated:  11/28/2014  Documentation initiated by:  Dessa Phi  Subjective/Objective Assessment:   64 y/o m admitted w/chf,r foot ulcer.TM:BPJP ca.     Action/Plan:   From SNF-[facility].   Anticipated DC Date:  12/28/14   Anticipated DC Plan:  [place]  CM consult      Choice offered to / List presented to:             Status of service:  Completed, signed off Medicare Important Message given?   (If response is "NO", the following Medicare IM given date fields will be blank) Date Medicare IM given:   Medicare IM given by:   Date Additional Medicare IM given:   Additional Medicare IM given by:    Discharge Disposition:  EXPIRED  Per UR Regulation:  Reviewed for med. necessity/level of care/duration of stay  If discussed at [place] of Stay Meetings, dates discussed:    Comments:  28-Dec-2014 Dessa Phi RN BSN NCM [phone] Expired.  12/01/14 Dessa Phi RN BSN NCM [phone] ID-bacteremia-iv abx, PAC-removed.IR-thoracentesis, Onc-c diff+,heparin gtt.D/C plan SNf.  11/28/14 Dessa Phi RN BSN NCM [phone] WOC-r foot eschar, recc-orthopedic Scientist, clinical (histocompatibility and immunogenetics).Onc-no further chemo,recc comfort care.

## 2014-12-18 DEATH — deceased

## 2015-10-11 IMAGING — XA IR FLUORO GUIDE CV LINE*R*
1 series · 1 of 1 positions shown · non-contrast
Comparison: none

CLINICAL DATA: Metastatic adenocarcinoma presumably of lung
primary. The patient requires a porta cath to begin chemotherapy.

[Series 300: line placements · 1 of 1 slices shown]
[im 1/1]
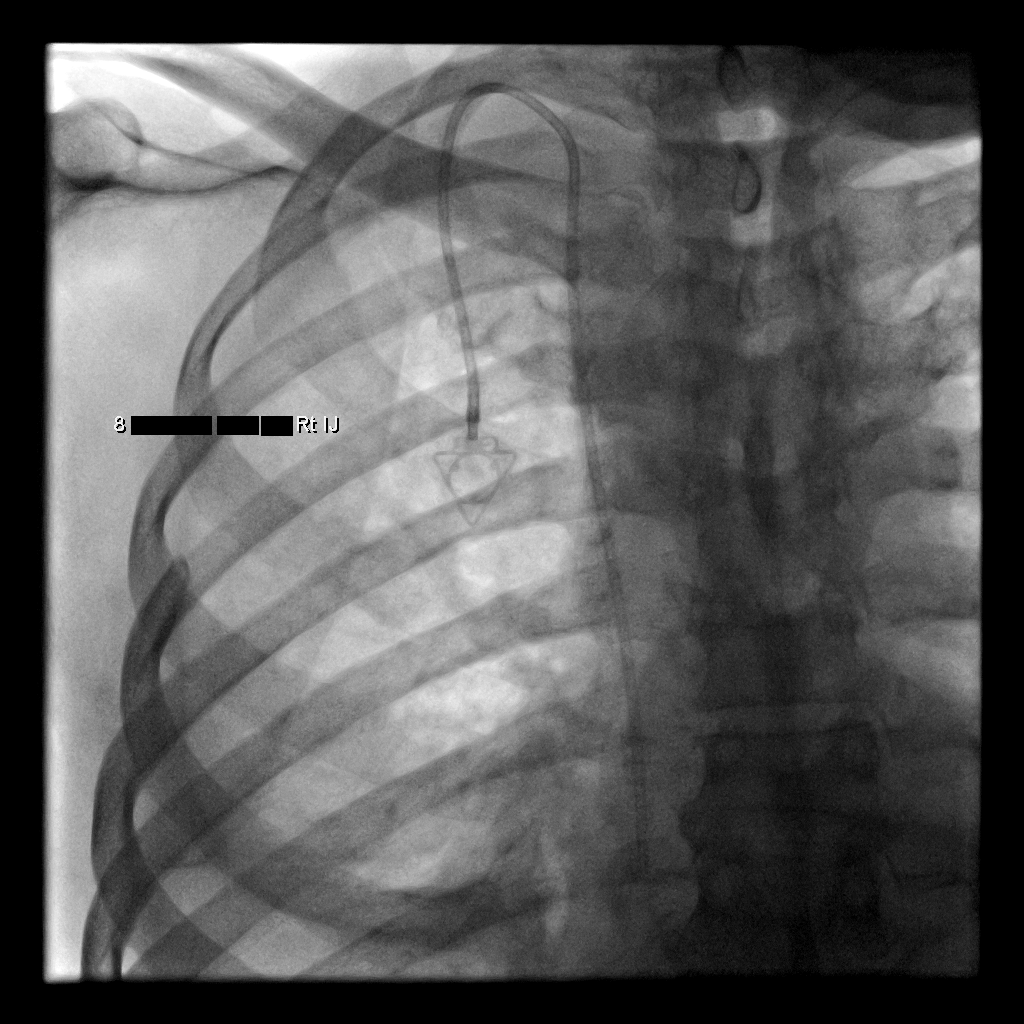

[1 of 1 positions shown; findings below may reference images not displayed]

EXAM:
IMPLANTED PORT A CATH PLACEMENT WITH ULTRASOUND AND FLUOROSCOPIC
GUIDANCE

ANESTHESIA/SEDATION:
2.0 Mg IV Versed; 100 mcg IV Fentanyl

Total Moderate Sedation Time:  33 minutes

Additional Medications: 2 g IV Ancef. As antibiotic prophylaxis,
Ancef was ordered pre-procedure and administered intravenously
within one hour of incision.

FLUOROSCOPY TIME:  12 seconds.

PROCEDURE:
The procedure, risks, benefits, and alternatives were explained to
the patient. Questions regarding the procedure were encouraged and
answered. The patient understands and consents to the procedure.

The right neck and chest were prepped with chlorhexidine in a
sterile fashion, and a sterile drape was applied covering the
operative field. Maximum barrier sterile technique with sterile
gowns and gloves were used for the procedure. A time-out procedure
was performed. Local anesthesia was provided with 1% lidocaine.

Ultrasound was used to confirm patency of the right internal jugular
vein. After creating a small venotomy incision, a 21 gauge needle
was advanced into the right internal jugular vein under direct,
real-time ultrasound guidance. Ultrasound image documentation was
performed. After securing guidewire access, an 8 Fr dilator was
placed. A J-wire was kinked to measure appropriate catheter length.

A subcutaneous port pocket was then created along the upper chest
wall utilizing sharp and blunt dissection. Portable cautery was
utilized. The pocket was irrigated with sterile saline.

A single lumen power injectable port was chosen for placement. The 8
Fr catheter was tunneled from the port pocket site to the venotomy
incision. The port was placed in the pocket. External catheter was
trimmed to appropriate length based on guidewire measurement.

At the venotomy, an 8 Fr peel-away sheath was placed over a
guidewire. The catheter was then placed through the sheath and the
sheath removed. Final catheter positioning was confirmed and
documented with a fluoroscopic spot image. The port was accessed
with a needle and aspirated and flushed with heparinized saline. The
needle was removed.

The venotomy and port pocket incisions were closed with subcutaneous
3-0 Monocryl and subcuticular 4-0 Vicryl. Dermabond was applied to
both incisions.

COMPLICATIONS:
None
FINDINGS: After catheter placement, the tip lies at the cavoatrial junction.
The catheter aspirates normally and is ready for immediate use.
IMPRESSION: Placement of single lumen port a cath via right internal jugular
vein. The catheter tip lies at the cavoatrial junction. A power
injectable port a cath was placed and is ready for immediate use.

## 2015-10-11 IMAGING — CR DG CHEST 1V
1 series · 1 of 1 positions shown · non-contrast
Comparison: 09/14/2014.

CLINICAL DATA: Subsequent encounter for left pleural effusion.

EXAM:
CHEST - 1 VIEW

[view not recorded]
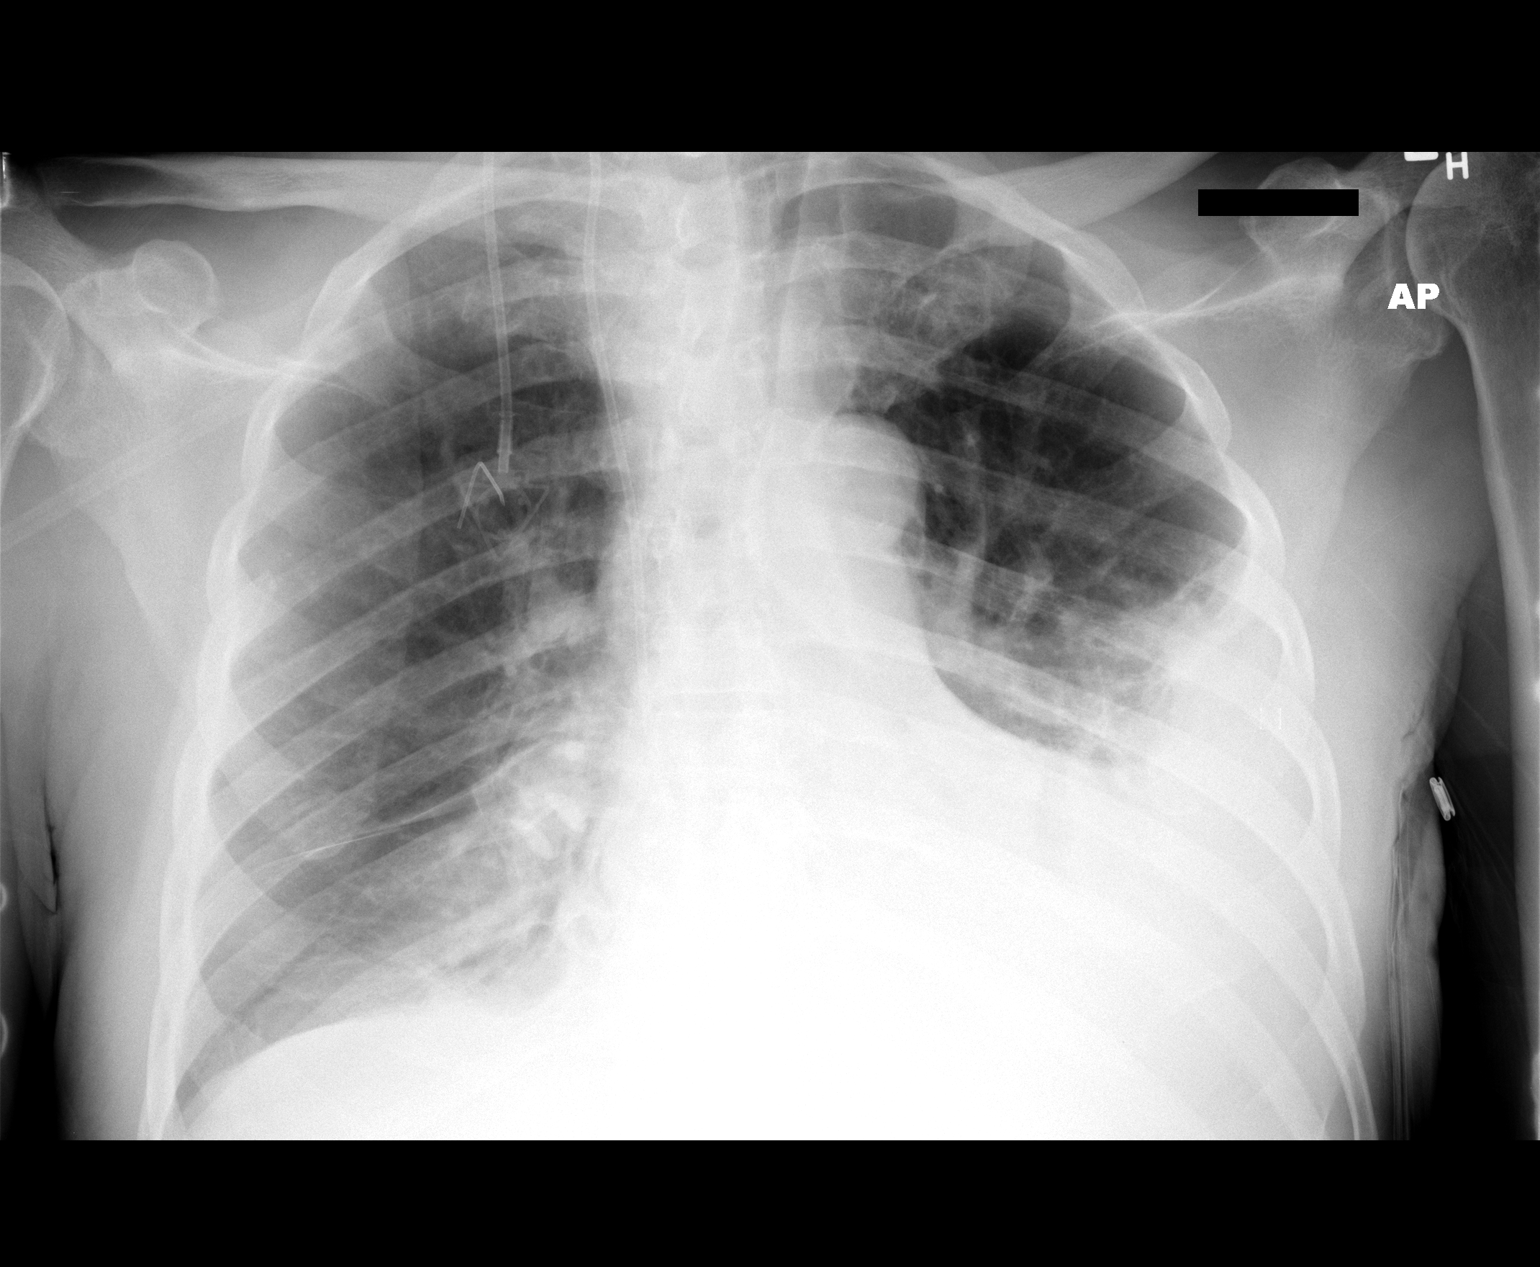

[1 of 1 positions shown; findings below may reference images not displayed]

FINDINGS: 5355 hrs. Moderate left pleural effusion has progressed in the
interval. No substantial right pleural effusion. No evidence for
pneumothorax. Right Port-A-Cath is new in the interval with tip
overlying the distal SVC. Cardiopericardial silhouette is upper
limits of normal for size. Mild leftward deviation of the trachea
stable.
IMPRESSION: No evidence of pneumothorax status post thoracentesis.

## 2015-10-13 ENCOUNTER — Other Ambulatory Visit: Payer: Self-pay | Admitting: Nurse Practitioner

## 2015-11-01 ENCOUNTER — Other Ambulatory Visit: Payer: Self-pay | Admitting: Nurse Practitioner

## 2015-12-29 IMAGING — DX DG CHEST 1V PORT
1 series · 1 of 1 positions shown · non-contrast
Comparison: Portable exam 3181 hr compared to 12/01/2014

CLINICAL DATA: Shortness of breath, hypoxia, hypertension,
diabetes, metastatic lung cancer

EXAM:
PORTABLE CHEST - 1 VIEW

[chest ap]
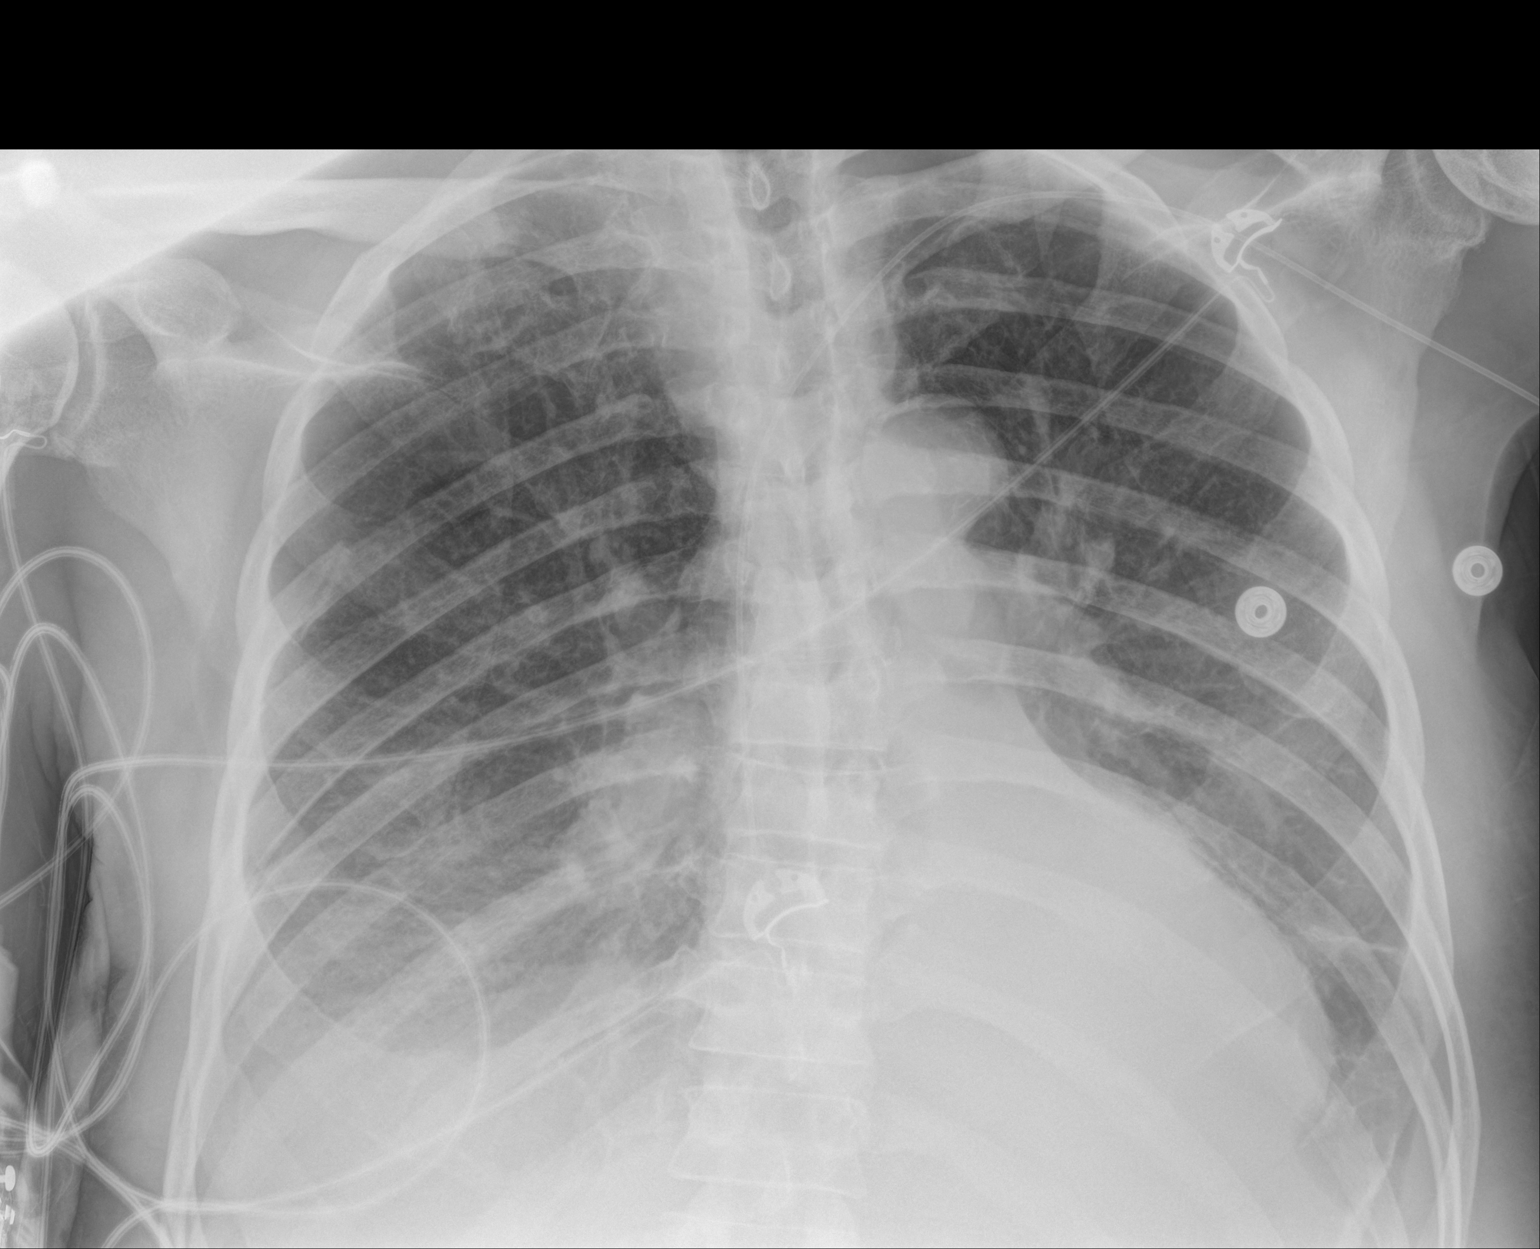

[1 of 1 positions shown; findings below may reference images not displayed]

FINDINGS: LEFT arm PICC line tip projects over cavoatrial junction.

Upper normal heart size.

Normal mediastinal contours and pulmonary vascularity.

Atelectasis versus consolidation LEFT lower lobe unchanged.

Probable RIGHT basilar pleural effusion with atelectasis versus
infiltrate at RIGHT base.

Upper lungs clear.

Atherosclerotic calcification aorta.

No pneumothorax.
IMPRESSION: Little interval change in atelectasis versus consolidation in LEFT
lower lobe, probable RIGHT pleural effusion and mild RIGHT basilar
atelectasis versus infiltrate.
